# Patient Record
Sex: Female | Born: 1990 | Race: Black or African American | Hispanic: No | Marital: Single | Smoking: Current every day smoker
Health system: Southern US, Community
[De-identification: ages and names within clinical notes are randomized; demographics above are authoritative.]

## PROBLEM LIST (undated history)

## (undated) DIAGNOSIS — F419 Anxiety disorder, unspecified: Secondary | ICD-10-CM

## (undated) DIAGNOSIS — N83209 Unspecified ovarian cyst, unspecified side: Secondary | ICD-10-CM

## (undated) DIAGNOSIS — I10 Essential (primary) hypertension: Secondary | ICD-10-CM

## (undated) DIAGNOSIS — F32A Depression, unspecified: Secondary | ICD-10-CM

## (undated) DIAGNOSIS — J45909 Unspecified asthma, uncomplicated: Secondary | ICD-10-CM

## (undated) DIAGNOSIS — E669 Obesity, unspecified: Secondary | ICD-10-CM

## (undated) DIAGNOSIS — F329 Major depressive disorder, single episode, unspecified: Secondary | ICD-10-CM

## (undated) HISTORY — DX: Unspecified asthma, uncomplicated: J45.909

## (undated) HISTORY — DX: Major depressive disorder, single episode, unspecified: F32.9

## (undated) HISTORY — DX: Essential (primary) hypertension: I10

## (undated) HISTORY — DX: Depression, unspecified: F32.A

---
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
## 2009-02-09 ENCOUNTER — Encounter: Payer: Self-pay | Admitting: Family Medicine

## 2009-02-09 ENCOUNTER — Ambulatory Visit: Payer: Self-pay | Admitting: Family Medicine

## 2009-02-09 DIAGNOSIS — R809 Proteinuria, unspecified: Secondary | ICD-10-CM | POA: Insufficient documentation

## 2009-02-09 LAB — CONVERTED CEMR LAB
Bilirubin Urine: NEGATIVE
Nitrite: NEGATIVE
Urobilinogen, UA: 0.2
Whiff Test: NEGATIVE

## 2009-02-10 LAB — CONVERTED CEMR LAB
Chlamydia, DNA Probe: NEGATIVE
GC Probe Amp, Genital: NEGATIVE

## 2009-07-06 ENCOUNTER — Encounter: Payer: Self-pay | Admitting: Family Medicine

## 2009-07-25 ENCOUNTER — Ambulatory Visit: Payer: Self-pay | Admitting: Family Medicine

## 2009-07-25 ENCOUNTER — Encounter: Payer: Self-pay | Admitting: Family Medicine

## 2009-07-25 LAB — CONVERTED CEMR LAB

## 2009-07-26 ENCOUNTER — Encounter: Payer: Self-pay | Admitting: Family Medicine

## 2009-07-27 ENCOUNTER — Ambulatory Visit: Payer: Self-pay | Admitting: Family Medicine

## 2009-07-27 ENCOUNTER — Encounter: Payer: Self-pay | Admitting: Family Medicine

## 2009-07-27 ENCOUNTER — Encounter: Payer: Self-pay | Admitting: *Deleted

## 2009-07-27 ENCOUNTER — Telehealth (INDEPENDENT_AMBULATORY_CARE_PROVIDER_SITE_OTHER): Payer: Self-pay | Admitting: Family Medicine

## 2009-07-27 LAB — CONVERTED CEMR LAB
CO2: 25 meq/L (ref 19–32)
Cholesterol: 169 mg/dL (ref 0–200)
Creatinine, Ser: 0.79 mg/dL (ref 0.40–1.20)
Glucose, Bld: 104 mg/dL — ABNORMAL HIGH (ref 70–99)
Total Bilirubin: 0.5 mg/dL (ref 0.3–1.2)
Total CHOL/HDL Ratio: 4.2
Total Protein: 6.9 g/dL (ref 6.0–8.3)
Triglycerides: 158 mg/dL — ABNORMAL HIGH (ref <150)
VLDL: 32 mg/dL (ref 0–40)

## 2009-07-28 ENCOUNTER — Ambulatory Visit: Payer: Self-pay | Admitting: Family Medicine

## 2009-07-31 ENCOUNTER — Ambulatory Visit: Payer: Self-pay | Admitting: Family Medicine

## 2009-07-31 LAB — CONVERTED CEMR LAB: Beta hcg, urine, semiquantitative: NEGATIVE

## 2009-10-27 ENCOUNTER — Encounter: Payer: Self-pay | Admitting: Family Medicine

## 2010-06-12 NOTE — Miscellaneous (Signed)
Summary: STD tx order  Please give patient Azithromycin 1 gm slurry and Ceftriaxone 125 mg IM.  Lequita Asal  MD  July 26, 2009 9:56 AM

## 2010-06-12 NOTE — Miscellaneous (Signed)
Summary: cpe/pap,tcb   Allergies: No Known Drug Allergies   Complete Medication List: 1)  Flonase 50 Mcg/act Susp (Fluticasone propionate) .... 2 sprays in each nostril daily 2)  Proventil Hfa 108 (90 Base) Mcg/act Aers (Albuterol sulfate) .... Two puffs q4-q6 as needed shortness of breath/wheezing 3)  Hydrochlorothiazide 12.5 Mg Caps (Hydrochlorothiazide) .... One tab by mouth daily 4)  Sprintec 28 0.25-35 Mg-mcg Tabs (Norgestimate-eth estradiol) .Marland Kitchen.. 1 tab by mouth daily for birth control  Other Orders: No Charge Patient Arrived (NCPA0) (NCPA0)

## 2010-06-12 NOTE — Assessment & Plan Note (Signed)
Summary: STD TX/[facility]     Nurse Visit  patient left after having blood drawn before receiving STD treatment. she will return tomorrow. Theresia Lo RN  July 27, 2009 4:15 PM   Allergies: No Known Drug Allergies  Orders Added: 1)  No Charge Patient Arrived (NCPA0) [NCPA0]

## 2010-06-12 NOTE — Miscellaneous (Signed)
   Clinical Lists Changes  Problems: Removed problem of GONORRHEA (ICD-098.0) Removed problem of CHLAMYDIAL INFECTION (ICD-099.41) Removed problem of CONTRACEPTIVE MANAGEMENT (ICD-V25.09) Removed problem of SEXUALLY TRANSMITTED DISEASE, EXPOSURE TO (ICD-V01.6) Removed problem of ROUTINE GYNECOLOGICAL EXAMINATION (ICD-V72.31) Medications: Removed medication of SPRINTEC 28 0.25-35 MG-MCG TABS (NORGESTIMATE-ETH ESTRADIOL) 1 tab by mouth daily for birth control Removed medication of HYDROCHLOROTHIAZIDE 12.5 MG CAPS (HYDROCHLOROTHIAZIDE) one tab by mouth daily

## 2010-06-12 NOTE — Progress Notes (Signed)
Summary: phn msg   Phone Note Call from Patient Call back at [facility] Phone [phone]   Caller: Patient Summary of Call: Pt returning call not sure who called her. Initial call taken by: Clydell Hakim,  July 27, 2009 9:50 AM  Follow-up for Phone Call        patient had appointment this AM for labs and STD treatment . she left before she received STD treatment. spoke with patient and she will come back this afternoon. Follow-up by: Theresia Lo RN,  July 27, 2009 11:13 AM

## 2010-06-12 NOTE — Assessment & Plan Note (Signed)
Summary: std treatment/ls   Nurse Visit   Allergies: No Known Drug Allergies  Medication Administration  Injection # 1:    Medication: Rocephin  250mg     Diagnosis: GONORRHEA (ICD-098.0)    Route: IM    Site: RUOQ gluteus    Exp Date: 11/2011    Lot #: [id number]    Mfr: Sandoz    Comments:    Rocephin 125 mg given IM. patient waited in office 20 minutes without any complications.    Patient tolerated injection without complications    Given by: Theresia Lo RN (July 28, 2009 2:12 PM)  Medication # 1:    Medication: Azithromycin oral    Diagnosis: GONORRHEA (ICD-098.0)    Dose: 1 gram    Route: po    Exp Date: 04/12/2010    Lot #: [id number]    Mfr: pfizer    Patient tolerated medication without complications    Given by: Theresia Lo RN (July 28, 2009 2:22 PM)  Orders Added: 1)  Azithromycin oral [Q0144] 2)  Rocephin  250mg  [J0696] 3)  Admin of Injection (IM/SQ) [69629]   Medication Administration  Injection # 1:    Medication: Rocephin  250mg     Diagnosis: GONORRHEA (ICD-098.0)    Route: IM    Site: RUOQ gluteus    Exp Date: 11/2011    Lot #: [id number]    Mfr: Sandoz    Comments:    Rocephin 125 mg given IM. patient waited in office 20 minutes without any complications.    Patient tolerated injection without complications    Given by: Theresia Lo RN (July 28, 2009 2:12 PM)  Medication # 1:    Medication: Azithromycin oral    Diagnosis: GONORRHEA (ICD-098.0)    Dose: 1 gram    Route: po    Exp Date: 04/12/2010    Lot #: [id number]    Mfr: pfizer    Patient tolerated medication without complications    Given by: Theresia Lo RN (July 28, 2009 2:22 PM)  Orders Added: 1)  Azithromycin oral [Q0144] 2)  Rocephin  250mg  [J0696] 3)  Admin of Injection (IM/SQ) [72536]    patient advised to tell partner to be treated. , abstain from sex for 7 days and always use condoms to prevent STD. states she has been off birth control pills since september 2010.  she wanted  Rx sent in . she has a current Rx at pharmacy. advised patient after she had left office to come back in for Upreg before starting pills. she will come back in Monday. Theresia Lo RN  July 28, 2009 2:32 PM  Appended Document: std treatment/ls Communicable Disease report faxed to [facility]

## 2010-06-12 NOTE — Letter (Signed)
Summary: Generic Letter  [facility]  [address]   [place], [place] [address]   Phone: [phone]  Fax: [phone]    07/27/2009  Sara Taylor [address] [place], [place]  [address]  Dear [name],    Your recent labs were all normal. Please call if you have questions.       Sincerely,   Lequita Asal  MD  Appended Document: Generic Letter mailed.

## 2010-06-12 NOTE — Assessment & Plan Note (Signed)
Summary: CPP/EO   Vital Signs:  Patient profile:   20 year old female Height:      65.0 inches Weight:      312.1 pounds BMI:     52.12 Temp:     98.4 degrees F Pulse rate:   83 / minute BP sitting:   147 / 89  (left arm)  Vitals Entered By: Starleen Blue RN (July 25, 2009 10:54 AM)  Nutrition Counseling: Patient's BMI is greater than 25 and therefore counseled on weight management options. CC: cpe Is Patient Diabetic? No Pain Assessment Patient in pain? no        Primary Care Provider:  Lequita Asal  MD  CC:  cpe.  History of Present Illness: 20 y/o female with h/o HTN here for CPE.  high school graduate. currently unemployed. trying to get into school at [employer].  Not currently dating anyone exclusively, but is sexually active. Endorses consistent condom use.   HTN- not on meds. denies CP, SOB, peripheral edema, palpitations, blurred vision.   Habits & Providers  Alcohol-Tobacco-Diet     Tobacco Status: current     Tobacco Counseling: to quit use of tobacco products     Cigarette Packs/Day: <0.25  Current Medications (verified): 1)  Sprintec 28 0.25-35 Mg-Mcg Tabs (Norgestimate-Eth Estradiol) .Marland Kitchen.. 1 Tab By Mouth Daily For Birth Control  Allergies (verified): No Known Drug Allergies  Past History:  Past medical, surgical, family and social histories (including risk factors) reviewed, and no changes noted (except as noted below).  Past Medical History: Reviewed history from 06/09/2008 and no changes required. ? Reactive airway DZ, + acanthosis nigricans, Regular periods, menarche 20 y/o  Past Surgical History: Reviewed history from 07/10/2006 and no changes required. nonfasting CBG 105 - 01/24/2005  Family History: Reviewed history from 07/10/2006 and no changes required. grandfather- DM, mult family members obese, mult family members w/asthma mother- hyperthyroidism, HTN  Social History: Reviewed history from 06/09/2008 and no changes  required. lives with mother Karin [place]-[facility] patient) and sister.  currently trying to get into school at [employer]. No drugs, EtoH. previously smoked 1/2 PPD, down to 2-3 cigs/day.Packs/Day:  <0.25  Physical Exam  General:  obese female, NAD, vitals reviewed. hirsute Eyes:  vision grossly normal. EOMI, PERRLA, no scleral icterus.  Ears:  hearing grossly normal Nose:  External nasal examination shows no deformity or inflammation. Nasal mucosa are pink and moist without lesions or exudates. Mouth:  Oral mucosa and oropharynx without lesions or exudates.  Teeth in good repair. Neck:  No deformities, masses, or tenderness noted. acanthosis nigricans Breasts:  No mass, nodules, thickening, tenderness, bulging, retraction, inflamation, nipple discharge or skin changes noted.   Lungs:  Normal respiratory effort, chest expands symmetrically. Lungs are clear to auscultation, no crackles or wheezes. Heart:  Normal rate and regular rhythm. S1 and S2 normal without gallop, murmur, click, rub or other extra sounds. Abdomen:  obese, NT, ND, +BS.  Genitalia:  Normal introitus for age, no external lesions, no vaginal discharge, mucosa pink and moist, no vaginal or cervical lesions, no vaginal atrophy, no friaility or hemorrhage, normal uterus size and position, no adnexal masses or tenderness Extremities:  no c/c/e of BLE.  Neurologic:  alert & oriented X3 and cranial nerves II-XII intact.     Impression & Recommendations:  Problem # 1:  ROUTINE GYNECOLOGICAL EXAMINATION (ICD-V72.31) Assessment Unchanged  no need for pap. remainder of exam normal. f/u in 1 year.   Orders: [facility] - Est  18-39 yrs ([id number])  Problem # 2:  SEXUALLY TRANSMITTED DISEASE, EXPOSURE TO (ICD-V01.6) Assessment: Unchanged wet prep negative. gc/chl and other labs pending. encouraged condom use every time.   Orders: Wet Prep- [facility] [phone]) GC/Chlamydia-[facility] ([id number])  Future Orders: RPR-[facility] ([id number]) ... 07/26/2010 Hep  C Ab-[facility] ([id number]) ... 07/26/2010 Hep Bs Ag-[facility] ([id number]) ... 07/26/2010 HIV-[facility] ([id number]) ... 07/26/2010  Problem # 3:  HYPERTENSION, BENIGN SYSTEMIC (ICD-401.1) Assessment: Deteriorated patient encouraged to take medications as prescribed. check labs. resume HCTZ  The following medications were removed from the medication list:    Hydrochlorothiazide 12.5 Mg Caps (Hydrochlorothiazide) ..... One tab by mouth daily Her updated medication list for this problem includes:    Hydrochlorothiazide 12.5 Mg Caps (Hydrochlorothiazide) ..... One tab by mouth daily  Future Orders: Lipid-[facility] ([id number]) ... 07/26/2010 Comp Met-[facility] ([id number]) ... 07/26/2010  Problem # 4:  TOBACCO USE (ICD-305.1) Assessment: Unchanged patient encourage to quit. given information about quit line.  Prescriptions: HYDROCHLOROTHIAZIDE 12.5 MG CAPS (HYDROCHLOROTHIAZIDE) one tab by mouth daily  #30 x 3   Entered and Authorized by:   Lequita Asal  MD   Signed by:   Lequita Asal  MD on 07/25/2009   Method used:   Electronically to        [facility] [phone]* (retail)       [address]       [place], [place]  [address]       Ph: [phone] or [phone]       Fax: [phone]   RxID:   [id number]   Laboratory Results  Date/Time Received: July 25, 2009 11:21 AM  Date/Time Reported: July 25, 2009 11:35 AM   Wet [place] Source: vag WBC/hpf: 10-20 Bacteria/hpf: 3+  Rods Clue cells/hpf: none  Negative whiff Yeast/hpf: none Trichomonas/hpf: none Comments: ...............test performed by......Marland KitchenBonnie A. [place], MLS (ASCP)cm    Appended Document: CPP/EO     Clinical Lists Changes  Observations: Added new observation of HTNSMSUPP: Written self-care plan, Education handout (07/25/2009 12:17) Added new observation of PTPLANACTIV: take a 30 minute walk every day (07/25/2009 12:17) Added new observation of PTPLANEATING: eat foods that are  low in salt (07/25/2009 12:17) Added new observation of PTPLANMEDMON: take my medicines every day (07/25/2009 12:17) Added new observation of PERSBPGOAL: 140/90 (07/25/2009 12:17) Added new observation of HTN PROGRESS: Deteriorated (07/25/2009 12:17) Added new observation of HTN FSREVIEW: Yes (07/25/2009 12:17) Added new observation of DM PROGRESS: N/A (07/25/2009 12:17) Added new observation of DM FSREVIEW: N/A (07/25/2009 12:17) Added new observation of LIPID PROGRS: N/A (07/25/2009 12:17) Added new observation of LIPID FSREVW: N/A (07/25/2009 12:17)       Prevention & Chronic Care Immunizations   Influenza vaccine: Not documented    Tetanus booster: Not documented    Pneumococcal vaccine: Not documented  Other Screening   Pap smear: NEGATIVE FOR INTRAEPITHELIAL LESIONS OR MALIGNANCY.  (06/09/2008)   Smoking status: current  (07/25/2009)   Smoking cessation counseling: yes  (06/09/2008)  Hypertension   Last Blood Pressure: 147 / 89  (07/25/2009)   Serum creatinine: 0.79  (06/09/2008)   Serum potassium 3.7  (06/09/2008)    Hypertension flowsheet reviewed?: Yes   Progress toward BP goal: Deteriorated  Self-Management Support :   Personal Goals (by the next clinic visit) :      Personal blood pressure goal: 140/90  (07/25/2009)   Patient will work on the following items until the next clinic visit to reach self-care goals:     Medications  and monitoring: take my medicines every day  (07/25/2009)     Eating: eat foods that are low in salt  (07/25/2009)     Activity: take a 30 minute walk every day  (07/25/2009)    Hypertension self-management support: Written self-care plan, Education handout  (07/25/2009)   Hypertension self-care plan printed.   Hypertension education handout printed

## 2010-10-26 ENCOUNTER — Emergency Department (HOSPITAL_COMMUNITY)
Admission: EM | Admit: 2010-10-26 | Discharge: 2010-10-26 | Disposition: A | Discharge status: Home / Self Care | Payer: Self-pay | Attending: Emergency Medicine | Admitting: Emergency Medicine

## 2010-10-26 DIAGNOSIS — L0231 Cutaneous abscess of buttock: Secondary | ICD-10-CM | POA: Insufficient documentation

## 2010-10-26 DIAGNOSIS — L02419 Cutaneous abscess of limb, unspecified: Secondary | ICD-10-CM | POA: Insufficient documentation

## 2010-10-26 DIAGNOSIS — L03317 Cellulitis of buttock: Secondary | ICD-10-CM | POA: Insufficient documentation

## 2010-10-26 DIAGNOSIS — E669 Obesity, unspecified: Secondary | ICD-10-CM | POA: Insufficient documentation

## 2010-10-26 LAB — DIFFERENTIAL
Basophils Relative: 0 % (ref 0–1)
Monocytes Relative: 6 % (ref 3–12)
Neutro Abs: 9.3 10*3/uL — ABNORMAL HIGH (ref 1.7–7.7)
Neutrophils Relative %: 77 % (ref 43–77)

## 2010-10-26 LAB — COMPREHENSIVE METABOLIC PANEL
ALT: 30 U/L (ref 0–35)
AST: 29 U/L (ref 0–37)
Alkaline Phosphatase: 78 U/L (ref 39–117)
CO2: 26 mEq/L (ref 19–32)
Calcium: 9.2 mg/dL (ref 8.4–10.5)
Chloride: 100 mEq/L (ref 96–112)
GFR calc Af Amer: >60 mL/min (ref >60)
GFR calc non Af Amer: >60 mL/min (ref >60)
Glucose, Bld: 113 mg/dL — ABNORMAL HIGH (ref 70–99)
Potassium: 3.1 mEq/L — ABNORMAL LOW (ref 3.5–5.1)
Sodium: 137 mEq/L (ref 135–145)
Total Bilirubin: 0.6 mg/dL (ref 0.3–1.2)

## 2010-10-26 LAB — CBC
Hemoglobin: 12.5 g/dL (ref 12.0–15.0)
MCH: 30.7 pg (ref 26.0–34.0)
RBC: 4.07 MIL/uL (ref 3.87–5.11)
WBC: 12.1 10*3/uL — ABNORMAL HIGH (ref 4.0–10.5)

## 2010-10-26 LAB — RAPID URINE DRUG SCREEN, HOSP PERFORMED
Amphetamines: NOT DETECTED
Barbiturates: NOT DETECTED
Tetrahydrocannabinol: POSITIVE — AB

## 2010-10-27 ENCOUNTER — Emergency Department (HOSPITAL_COMMUNITY)
Admission: EM | Admit: 2010-10-27 | Discharge: 2010-10-27 | Discharge status: Against Medical Advice | Payer: Self-pay | Attending: Emergency Medicine | Admitting: Emergency Medicine

## 2010-10-27 DIAGNOSIS — L02419 Cutaneous abscess of limb, unspecified: Secondary | ICD-10-CM | POA: Insufficient documentation

## 2010-10-27 DIAGNOSIS — Z0389 Encounter for observation for other suspected diseases and conditions ruled out: Secondary | ICD-10-CM | POA: Insufficient documentation

## 2010-10-27 DIAGNOSIS — E669 Obesity, unspecified: Secondary | ICD-10-CM | POA: Insufficient documentation

## 2010-10-27 DIAGNOSIS — L03317 Cellulitis of buttock: Secondary | ICD-10-CM | POA: Insufficient documentation

## 2010-10-27 DIAGNOSIS — L0231 Cutaneous abscess of buttock: Secondary | ICD-10-CM | POA: Insufficient documentation

## 2010-10-27 DIAGNOSIS — L03119 Cellulitis of unspecified part of limb: Secondary | ICD-10-CM | POA: Insufficient documentation

## 2010-10-27 DIAGNOSIS — IMO0001 Reserved for inherently not codable concepts without codable children: Secondary | ICD-10-CM | POA: Insufficient documentation

## 2010-10-30 ENCOUNTER — Emergency Department (HOSPITAL_COMMUNITY)
Admission: EM | Admit: 2010-10-30 | Discharge: 2010-10-30 | Disposition: A | Discharge status: Home / Self Care | Payer: Self-pay | Attending: Emergency Medicine | Admitting: Emergency Medicine

## 2010-10-30 DIAGNOSIS — L02419 Cutaneous abscess of limb, unspecified: Secondary | ICD-10-CM | POA: Insufficient documentation

## 2010-10-30 DIAGNOSIS — L03119 Cellulitis of unspecified part of limb: Secondary | ICD-10-CM | POA: Insufficient documentation

## 2010-10-30 DIAGNOSIS — E669 Obesity, unspecified: Secondary | ICD-10-CM | POA: Insufficient documentation

## 2011-10-28 ENCOUNTER — Ambulatory Visit: Payer: Self-pay | Admitting: Family Medicine

## 2011-11-07 ENCOUNTER — Encounter: Payer: Self-pay | Admitting: Family Medicine

## 2012-03-09 ENCOUNTER — Encounter: Payer: Self-pay | Admitting: Family Medicine

## 2012-03-09 ENCOUNTER — Other Ambulatory Visit (HOSPITAL_COMMUNITY)
Admission: RE | Admit: 2012-03-09 | Discharge: 2012-03-09 | Disposition: A | Discharge status: Home / Self Care | Payer: Medicaid Other | Source: Ambulatory Visit | Attending: Family Medicine | Admitting: Family Medicine

## 2012-03-09 ENCOUNTER — Ambulatory Visit (INDEPENDENT_AMBULATORY_CARE_PROVIDER_SITE_OTHER): Payer: Medicaid Other | Admitting: Family Medicine

## 2012-03-09 VITALS — BP 148/77 | HR 98 | Temp 99.1°F | Ht 65.0 in | Wt 309.2 lb

## 2012-03-09 DIAGNOSIS — N912 Amenorrhea, unspecified: Secondary | ICD-10-CM

## 2012-03-09 DIAGNOSIS — L709 Acne, unspecified: Secondary | ICD-10-CM

## 2012-03-09 DIAGNOSIS — E669 Obesity, unspecified: Secondary | ICD-10-CM

## 2012-03-09 DIAGNOSIS — Z0000 Encounter for general adult medical examination without abnormal findings: Secondary | ICD-10-CM

## 2012-03-09 DIAGNOSIS — Z01419 Encounter for gynecological examination (general) (routine) without abnormal findings: Secondary | ICD-10-CM | POA: Insufficient documentation

## 2012-03-09 DIAGNOSIS — L708 Other acne: Secondary | ICD-10-CM

## 2012-03-09 DIAGNOSIS — I10 Essential (primary) hypertension: Secondary | ICD-10-CM

## 2012-03-09 DIAGNOSIS — F17200 Nicotine dependence, unspecified, uncomplicated: Secondary | ICD-10-CM

## 2012-03-09 DIAGNOSIS — Z113 Encounter for screening for infections with a predominantly sexual mode of transmission: Secondary | ICD-10-CM | POA: Insufficient documentation

## 2012-03-09 DIAGNOSIS — Z124 Encounter for screening for malignant neoplasm of cervix: Secondary | ICD-10-CM

## 2012-03-09 DIAGNOSIS — N760 Acute vaginitis: Secondary | ICD-10-CM

## 2012-03-09 LAB — POCT GLYCOSYLATED HEMOGLOBIN (HGB A1C): Hemoglobin A1C: 5

## 2012-03-09 MED ORDER — HYDROCHLOROTHIAZIDE 25 MG PO TABS: 12.5000 mg | ORAL_TABLET | Freq: Every day | ORAL | Status: DC

## 2012-03-09 MED ORDER — TRETINOIN 0.025 % EX CREA: TOPICAL_CREAM | Freq: Every day | CUTANEOUS | Status: DC

## 2012-03-09 NOTE — Patient Instructions (Addendum)
Your blood pressure is over goal 140/90. Start HCTZ again. Make appointment in 2 weeks for check up. If you stop smoking and your BP is better, then birth control pills might be safer. Think about other options such as implanon or IUD for now. You can use tretinoin cream for acne.  Smoking Cessation, Tips for Success YOU CAN QUIT SMOKING If you are ready to quit smoking, congratulations! You have chosen to help yourself be healthier. Cigarettes bring nicotine, tar, carbon monoxide, and other irritants into your body. Your lungs, heart, and blood vessels will be able to work better without these poisons. There are many different ways to quit smoking. Nicotine gum, nicotine patches, a nicotine inhaler, or nicotine nasal spray can help with physical craving. Hypnosis, support groups, and medicines help break the habit of smoking. Here are some tips to help you quit for good.  Throw away all cigarettes.  Clean and remove all ashtrays from your home, work, and car.  On a card, write down your reasons for quitting. Carry the card with you and read it when you get the urge to smoke.  Cleanse your body of nicotine. Drink enough water and fluids to keep your urine clear or pale yellow. Do this after quitting to flush the nicotine from your body.  Learn to predict your moods. Do not let a bad situation be your excuse to have a cigarette. Some situations in your life might tempt you into wanting a cigarette.  Never have "just one" cigarette. It leads to wanting another and another. Remind yourself of your decision to quit.  Change habits associated with smoking. If you smoked while driving or when feeling stressed, try other activities to replace smoking. Stand up when drinking your coffee. Brush your teeth after eating. Sit in a different chair when you read the paper. Avoid alcohol while trying to quit, and try to drink fewer caffeinated beverages. Alcohol and caffeine may urge you to smoke.  Avoid  foods and drinks that can trigger a desire to smoke, such as sugary or spicy foods and alcohol.  Ask people who smoke not to smoke around you.  Have something planned to do right after eating or having a cup of coffee. Take a walk or exercise to perk you up. This will help to keep you from overeating.  Try a relaxation exercise to calm you down and decrease your stress. Remember, you may be tense and nervous for the first 2 weeks after you quit, but this will pass.  Find new activities to keep your hands busy. Play with a pen, coin, or rubber band. Doodle or draw things on paper.  Brush your teeth right after eating. This will help cut down on the craving for the taste of tobacco after meals. You can try mouthwash, too.  Use oral substitutes, such as lemon drops, carrots, a cinnamon stick, or chewing gum, in place of cigarettes. Keep them handy so they are available when you have the urge to smoke.  When you have the urge to smoke, try deep breathing.  Designate your home as a nonsmoking area.  If you are a heavy smoker, ask your caregiver about a prescription for nicotine chewing gum. It can ease your withdrawal from nicotine.  Reward yourself. Set aside the cigarette money you save and buy yourself something nice.  Look for support from others. Join a support group or smoking cessation program. Ask someone at home or at work to help you with your plan to  quit smoking.  Always ask yourself, "Do I need this cigarette or is this just a reflex?" Tell yourself, "Today, I choose not to smoke," or "I do not want to smoke." You are reminding yourself of your decision to quit, even if you do smoke a cigarette. HOW WILL I FEEL WHEN I QUIT SMOKING?  The benefits of not smoking start within days of quitting.  You may have symptoms of withdrawal because your body is used to nicotine (the addictive substance in cigarettes). You may crave cigarettes, be irritable, feel very hungry, cough often, get  headaches, or have difficulty concentrating.  The withdrawal symptoms are only temporary. They are strongest when you first quit but will go away within 10 to 14 days.  When withdrawal symptoms occur, stay in control. Think about your reasons for quitting. Remind yourself that these are signs that your body is healing and getting used to being without cigarettes.  Remember that withdrawal symptoms are easier to treat than the major diseases that smoking can cause.  Even after the withdrawal is over, expect periodic urges to smoke. However, these cravings are generally short-lived and will go away whether you smoke or not. Do not smoke!  If you relapse and smoke again, do not lose hope. Most smokers quit 3 times before they are successful.  If you relapse, do not give up! Plan ahead and think about what you will do the next time you get the urge to smoke. LIFE AS A NONSMOKER: MAKE IT FOR A MONTH, MAKE IT FOR LIFE Day 1: Hang this page where you will see it every day. Day 2: Get rid of all ashtrays, matches, and lighters. Day 3: Drink water. Breathe deeply between sips. Day 4: Avoid places with smoke-filled air, such as bars, clubs, or the smoking section of restaurants. Day 5: Keep track of how much money you save by not smoking. Day 6: Avoid boredom. Keep a good book with you or go to the movies. Day 7: Reward yourself! One week without smoking! Day 8: Make a dental appointment to get your teeth cleaned. Day 9: Decide how you will turn down a cigarette before it is offered to you. Day 10: Review your reasons for quitting. Day 11: Distract yourself. Stay active to keep your mind off smoking and to relieve tension. Take a walk, exercise, read a book, do a crossword puzzle, or try a new hobby. Day 12: Exercise. Get off the bus before your stop or use stairs instead of escalators. Day 13: Call on friends for support and encouragement. Day 14: Reward yourself! Two weeks without smoking! Day  15: Practice deep breathing exercises. Day 16: Bet a friend that you can stay a nonsmoker. Day 17: Ask to sit in nonsmoking sections of restaurants. Day 18: Hang up "No Smoking" signs. Day 19: Think of yourself as a nonsmoker. Day 20: Each morning, tell yourself you will not smoke. Day 21: Reward yourself! Three weeks without smoking! Day 22: Think of smoking in negative ways. Remember how it stains your teeth, gives you bad breath, and leaves you short of breath. Day 23: Eat a nutritious breakfast. Day 24:Do not relive your days as a smoker. Day 25: Hold a pencil in your hand when talking on the telephone. Day 26: Tell all your friends you do not smoke. Day 27: Think about how much better food tastes. Day 28: Remember, one cigarette is one too many. Day 29: Take up a hobby that will keep your hands busy.  Day 30: Congratulations! One month without smoking! Give yourself a big reward. Your caregiver can direct you to community resources or hospitals for support, which may include:  Group support.  Education.  Hypnosis.  Subliminal therapy. Document Released: 01/26/2004 Document Revised: 07/22/2011 Document Reviewed: 02/13/2009 [facility] Patient Information 2013 [place], [place].

## 2012-03-10 ENCOUNTER — Telehealth: Payer: Self-pay | Admitting: *Deleted

## 2012-03-10 ENCOUNTER — Encounter: Payer: Self-pay | Admitting: Family Medicine

## 2012-03-10 LAB — COMPREHENSIVE METABOLIC PANEL
ALT: 23 U/L (ref 0–35)
AST: 17 U/L (ref 0–37)
Albumin: 3.8 g/dL (ref 3.5–5.2)
Alkaline Phosphatase: 50 U/L (ref 39–117)
Calcium: 9 mg/dL (ref 8.4–10.5)
Chloride: 104 mEq/L (ref 96–112)
Potassium: 4 mEq/L (ref 3.5–5.3)
Sodium: 137 mEq/L (ref 135–145)

## 2012-03-10 LAB — TSH: TSH: 0.772 u[IU]/mL (ref 0.350–4.500)

## 2012-03-10 LAB — CBC
Hemoglobin: 12.2 g/dL (ref 12.0–15.0)
MCH: 30.6 pg (ref 26.0–34.0)
MCHC: 34.5 g/dL (ref 30.0–36.0)
RDW: 14.4 % (ref 11.5–15.5)

## 2012-03-10 NOTE — Assessment & Plan Note (Addendum)
Above goal. Will restart HCTZ at 12.5 mg daily. Recheck in 2 weeks. Counseled smoking cessation. Will not restart OCPs if she is still smoking and her BP is uncontrolled. Advised pt to consider implanon or mirena.

## 2012-03-10 NOTE — Assessment & Plan Note (Signed)
Counseled for cessation. She refuses assistive medications.

## 2012-03-10 NOTE — Assessment & Plan Note (Signed)
Worsened. Will check lipids, LFTs, TSH. Discussed weight loss principles with patient including increased activity and decreased calorie intake (soda and juice). F/u in 2 weeks.  Body mass index is 51.45 kg/(m^2).

## 2012-03-10 NOTE — Telephone Encounter (Signed)
Message copied by Farrell Ours on Tue Mar 10, 2012  5:27 PM ------      Message from: Durwin Reges      Created: Tue Mar 10, 2012 12:56 PM      Regarding: positive testing        Please inform pt she needs to come in for treatment of chlamydia. Azithromycin slurry x 1 PO.

## 2012-03-10 NOTE — Telephone Encounter (Signed)
LVM for patient to call back to inform of below and schedule an appointment for her to get treatment

## 2012-03-10 NOTE — Assessment & Plan Note (Signed)
Has manifestations of PCOS with irregular menses. Check A1c. Will start topical low dose retinoid for now. OCPs might help, but she is not a candidate while smoking with HTN.

## 2012-03-10 NOTE — Progress Notes (Signed)
  Subjective:    Patient ID: Sara Taylor, female    DOB: Feb 24, 1991, 21 y.o.   MRN: [id number]  HPI CPE.   1. HTN. Has not been taking any medications, was previously on HCTZ. She wishes to re-establish care for this problem.   2. Obesity. Has been gaining weight over past few years, consistently above 95% on weight curve during childhood. 282 lbs in 2009, now 309 lbs.  Has family history of HTN and diabetes.  3. Irregular menstrual cycle. Irregular periods occurring past 2 years. LMP was 01/02/12. She states her periods were regulated when she took OCPs previously. Is interested in restarting these. She does not wish to become pregnant. She also has acne, weight gain. She smokes cigarettes daily.  Past Medical History  Diagnosis Date  . Hypertension    Review of Systems Denies abdominal pain, dysuria, polyuria, fatigue, abdominal pain, chest pains, dyspnea, stress, depression, breast pain, hematuria, swelling.    Objective:   Physical Exam  Vitals reviewed. Constitutional: She is oriented to person, place, and time. She appears well-developed and well-nourished. No distress.       Morbidly obese  HENT:  Head: Normocephalic and atraumatic.  Mouth/Throat: Oropharynx is clear and moist. No oropharyngeal exudate.  Eyes: EOM are normal. Pupils are equal, round, and reactive to light.  Neck: Neck supple. No JVD present. No thyromegaly present.  Cardiovascular: Normal rate, regular rhythm, normal heart sounds and intact distal pulses.   No murmur heard. Pulmonary/Chest: Effort normal and breath sounds normal. No respiratory distress. She has no wheezes. She has no rales.  Abdominal: Soft. Bowel sounds are normal. She exhibits no distension. There is no tenderness. There is no rebound and no guarding.  Genitourinary: Vagina normal.       White discharge. Mucosa normal. No cervical motion tenderness.  Obesity limits fundal exam.  Musculoskeletal: She exhibits no edema and no  tenderness.  Lymphadenopathy:    She has no cervical adenopathy.  Neurological: She is alert and oriented to person, place, and time. No cranial nerve deficit. Coordination normal.  Skin: No rash noted. She is not diaphoretic.  Psychiatric: She has a normal mood and affect.       Assessment & Plan:

## 2012-03-11 ENCOUNTER — Encounter: Payer: Self-pay | Admitting: Family Medicine

## 2012-03-11 NOTE — Telephone Encounter (Signed)
LVm on [phone] for patient to call back. The 2 numbers on file are out of service

## 2012-03-11 NOTE — Telephone Encounter (Signed)
Will send letter to patient due to no answer numerous times

## 2012-03-23 ENCOUNTER — Ambulatory Visit: Payer: Medicaid Other | Admitting: Family Medicine

## 2012-04-15 ENCOUNTER — Telehealth: Payer: Self-pay | Admitting: *Deleted

## 2012-04-15 NOTE — Telephone Encounter (Signed)
Patient has finally called back and she is coming in at 9:45am 12/5 to get treatment for chlamydia

## 2012-04-16 ENCOUNTER — Ambulatory Visit (INDEPENDENT_AMBULATORY_CARE_PROVIDER_SITE_OTHER): Payer: Medicaid Other | Admitting: *Deleted

## 2012-04-16 DIAGNOSIS — A749 Chlamydial infection, unspecified: Secondary | ICD-10-CM

## 2012-04-16 MED ORDER — AZITHROMYCIN 1 G PO PACK
1.0000 g | PACK | Freq: Once | ORAL | Status: AC
  Administered 2012-04-16: 1 g via ORAL

## 2012-04-16 NOTE — Progress Notes (Signed)
Patient in for STD treatment.  Consulted with Dr. Gwendolyn Grant . Advised to return in one month for culture for cure. Advised to tell partners to be treated and abstain from sex for 7 days .    Communicable Disease report faxed to [facility].

## 2012-05-26 ENCOUNTER — Ambulatory Visit (INDEPENDENT_AMBULATORY_CARE_PROVIDER_SITE_OTHER): Payer: Medicaid Other | Admitting: Family Medicine

## 2012-05-26 ENCOUNTER — Other Ambulatory Visit (HOSPITAL_COMMUNITY)
Admission: RE | Admit: 2012-05-26 | Discharge: 2012-05-26 | Disposition: A | Discharge status: Home / Self Care | Payer: Medicaid Other | Source: Ambulatory Visit | Attending: Family Medicine | Admitting: Family Medicine

## 2012-05-26 VITALS — BP 98/67 | HR 92 | Temp 98.2°F | Ht 66.5 in | Wt 309.0 lb

## 2012-05-26 DIAGNOSIS — Z8619 Personal history of other infectious and parasitic diseases: Secondary | ICD-10-CM | POA: Insufficient documentation

## 2012-05-26 DIAGNOSIS — N898 Other specified noninflammatory disorders of vagina: Secondary | ICD-10-CM

## 2012-05-26 DIAGNOSIS — Z79899 Other long term (current) drug therapy: Secondary | ICD-10-CM | POA: Insufficient documentation

## 2012-05-26 DIAGNOSIS — Z113 Encounter for screening for infections with a predominantly sexual mode of transmission: Secondary | ICD-10-CM | POA: Insufficient documentation

## 2012-05-26 LAB — POCT URINE PREGNANCY: Preg Test, Ur: NEGATIVE

## 2012-05-26 LAB — POCT WET PREP (WET MOUNT): Clue Cells Wet Prep Whiff POC: POSITIVE

## 2012-05-26 NOTE — Assessment & Plan Note (Signed)
U preg negative, we will fax to [facility] for her.

## 2012-05-26 NOTE — Progress Notes (Signed)
  Subjective:    Patient ID: Sara Taylor, female    DOB: 1991-01-19, 22 y.o.   MRN: [id number]  HPI  Sara Taylor comes in for STD testing for proof of cure from Chlamydia she had in October.  She denies any vaginal itching, discharge or abdominal pain. She is sexually active with one parter (who was also treated), and they are using condoms.   She needs a pregnancy test to be done to send to [facility] where she is getting her mental health care. Again, she is using condoms, and has not missed a period.    Review of Systems See HPI    Objective:   Physical Exam BP 98/67  Pulse 92  Temp 98.2 F (36.8 C) (Oral)  Ht 5' 6.5" (1.689 m)  Wt 309 lb (140.161 kg)  BMI 49.13 kg/m2 General appearance: alert, cooperative and no distress Pelvic: cervix normal in appearance, exam obscured by obesity, external genitalia normal, no adnexal masses or tenderness, no cervical motion tenderness, rectovaginal septum normal, uterus normal size, shape, and consistency and vagina normal without discharge       Assessment & Plan:

## 2012-05-26 NOTE — Assessment & Plan Note (Signed)
GC/Chlamydia and wet prep done today for proof of cure, reviewed safe sex practices.

## 2012-05-26 NOTE — Patient Instructions (Addendum)
It was nice to meet you.  Your pregnancy test was negative.  I will send you a letter with your lab results, or call you if anything is abnormal.   Please think about what kind of birth control would work best for you and make an appointment to see Dr. Cristal Ford.

## 2012-05-29 ENCOUNTER — Encounter: Payer: Self-pay | Admitting: Family Medicine

## 2012-06-27 ENCOUNTER — Other Ambulatory Visit: Payer: Self-pay

## 2013-03-18 ENCOUNTER — Other Ambulatory Visit: Payer: Self-pay

## 2013-05-31 ENCOUNTER — Ambulatory Visit: Payer: Medicaid Other

## 2013-06-02 ENCOUNTER — Encounter: Payer: Self-pay | Admitting: Family Medicine

## 2013-06-02 ENCOUNTER — Ambulatory Visit (INDEPENDENT_AMBULATORY_CARE_PROVIDER_SITE_OTHER): Payer: No Typology Code available for payment source | Admitting: Family Medicine

## 2013-06-02 ENCOUNTER — Other Ambulatory Visit (HOSPITAL_COMMUNITY)
Admission: RE | Admit: 2013-06-02 | Discharge: 2013-06-02 | Disposition: A | Discharge status: Home / Self Care | Payer: No Typology Code available for payment source | Source: Ambulatory Visit | Attending: Family Medicine | Admitting: Family Medicine

## 2013-06-02 VITALS — BP 138/79 | HR 90 | Temp 99.6°F | Ht 66.5 in | Wt 311.0 lb

## 2013-06-02 DIAGNOSIS — Z113 Encounter for screening for infections with a predominantly sexual mode of transmission: Secondary | ICD-10-CM | POA: Insufficient documentation

## 2013-06-02 DIAGNOSIS — N760 Acute vaginitis: Secondary | ICD-10-CM

## 2013-06-02 DIAGNOSIS — Z0000 Encounter for general adult medical examination without abnormal findings: Secondary | ICD-10-CM

## 2013-06-02 LAB — HIV ANTIBODY (ROUTINE TESTING W REFLEX): HIV: NONREACTIVE

## 2013-06-02 NOTE — Progress Notes (Signed)
Sara Taylor is a 23 y.o. female who presents today for STD screening, physical.    Pt does not have complaints today, would like testing for STD's.    Past Medical History  Diagnosis Date  . Hypertension     History  Smoking status  . Current Every Day Smoker -- 0.20 packs/day  . Types: Cigarettes  Smokeless tobacco  . Not on file    Family History  Problem Relation Age of Onset  . Hypertension Maternal Grandmother   . Hypertension Maternal Grandfather   . Hypertension Paternal Grandmother   . Diabetes Paternal Grandmother     Current Outpatient Prescriptions on File Prior to Visit  Medication Sig Dispense Refill  . hydrochlorothiazide (HYDRODIURIL) 25 MG tablet Take 0.5 tablets (12.5 mg total) by mouth daily.  90 tablet  3  . tretinoin (RETIN-A) 0.025 % cream Apply topically at bedtime.  45 g  0   No current facility-administered medications on file prior to visit.    ROS: Per HPI.  All other systems reviewed and are negative.   Physical Exam Filed Vitals:   06/02/13 1449  BP: 138/79  Pulse: 90  Temp: 99.6 F (37.6 C)    Physical Examination: General appearance - alert, well appearing, and in no distress Chest - clear to auscultation, no wheezes, rales or rhonchi, symmetric air entry Heart - normal rate and regular rhythm, no murmurs appreciated   Lab Results  Component Value Date   HGBA1C 5.0 03/09/2012

## 2013-06-02 NOTE — Assessment & Plan Note (Signed)
STD check today, counseling on tobacco, safe sex practices, seat belt, and healthier eating/weight loss.  F/U in one yr, to call with results.

## 2013-06-02 NOTE — Assessment & Plan Note (Signed)
HIV, RPR, GC/Chlamydia today.  Call pt w/ results.

## 2013-06-02 NOTE — Patient Instructions (Signed)
Sexually Transmitted Disease A sexually transmitted disease (STD) is a disease or infection that may be passed (transmitted) from person to person, usually during sexual activity. This may happen by way of saliva, semen, blood, vaginal mucus, or urine. Common STDs include:   Gonorrhea.   Chlamydia.   Syphilis.   HIV and AIDS.   Genital herpes.   Hepatitis B and C.   Trichomonas.   Human papillomavirus (HPV).   Pubic lice.   Scabies.  Mites.  Bacterial vaginosis. WHAT ARE CAUSES OF STDs? An STD may be caused by bacteria, a virus, or parasites. STDs are often transmitted during sexual activity if one person is infected. However, they may also be transmitted through nonsexual means. STDs may be transmitted after:   Sexual intercourse with an infected person.   Sharing sex toys with an infected person.   Sharing needles with an infected person or using unclean piercing or tattoo needles.  Having intimate contact with the genitals, mouth, or rectal areas of an infected person.   Exposure to infected fluids during birth. WHAT ARE THE SIGNS AND SYMPTOMS OF STDs? Different STDs have different symptoms. Some people may not have any symptoms. If symptoms are present, they may include:   Painful or bloody urination.   Pain in the pelvis, abdomen, vagina, anus, throat, or eyes.   Skin rash, itching, irritation, growths, sores (lesions), ulcerations, or warts in the genital or anal area.  Abnormal vaginal discharge with or without bad odor.   Penile discharge in men.   Fever.   Pain or bleeding during sexual intercourse.   Swollen glands in the groin area.   Yellow skin and eyes (jaundice). This is seen with hepatitis.   Swollen testicles.  Infertility.  Sores and blisters in the mouth. HOW ARE STDs DIAGNOSED? To make a diagnosis, your health care provider may:   Take a medical history.   Perform a physical exam.   Take a sample of any  discharge for examination.  Swab the throat, cervix, opening to the penis, rectum, or vagina for testing.  Test a sample of your first morning urine.   Perform blood tests.   Perform a Pap smear, if this applies.   Perform a colposcopy.   Perform a laparoscopy.  HOW ARE STDs TREATED? Treatment depends on the STD. Some STDs may be treated but not cured.   Chlamydia, gonorrhea, trichomonas, and syphilis can be cured with antibiotics.   Genital herpes, hepatitis, and HIV can be treated, but not cured, with prescribed medicines. The medicines lessen symptoms.   Genital warts from HPV can be treated with medicine or by freezing, burning (electrocautery), or surgery. Warts may come back.   HPV cannot be cured with medicine or surgery. However, abnormal areas may be removed from the cervix, vagina, or vulva.   If your diagnosis is confirmed, your recent sexual partners need treatment. This is true even if they are symptom-free or have a negative culture or evaluation. They should not have sex until their health care providers say it is OK. HOW CAN I REDUCE MY RISK OF GETTING AN STD?  Use latex condoms, dental dams, and water-soluble lubricants during sexual activity. Do not use petroleum jelly or oils.  Get vaccinated for HPV and hepatitis. If you have not received these vaccines in the past, talk to your health care provider about whether one or both might be right for you.   Avoid risky sex practices that can break the skin.  WHAT SHOULD   I DO IF I THINK I HAVE AN STD?  See your health care provider.   Inform all sexual partners. They should be tested and treated for any STDs.  Do not have sex until your health care provider says it is OK. WHEN SHOULD I GET HELP? Seek immediate medical care if:  You develop severe abdominal pain.  You are a man and notice swelling or pain in the testicles.  You are a woman and notice swelling or pain in your vagina. Document  Released: 07/20/2002 Document Revised: 02/17/2013 Document Reviewed: 11/17/2012 [employer] Patient Information 2014 [employer].  

## 2013-06-03 ENCOUNTER — Encounter: Payer: Self-pay | Admitting: Family Medicine

## 2013-06-03 ENCOUNTER — Telehealth: Payer: Self-pay | Admitting: Family Medicine

## 2013-06-03 LAB — RPR

## 2013-06-03 NOTE — Telephone Encounter (Signed)
LVM for pt to call us back.  Please let pt know her results for HIV, syphillis, gonorrhea, and chlamydia were all negative.  Thanks, Twana FirstBryan R. Paulina FusiHess, DO [facility] 06/03/2013, 1:24 PM

## 2013-08-14 ENCOUNTER — Emergency Department (HOSPITAL_COMMUNITY)
Admission: EM | Admit: 2013-08-14 | Discharge: 2013-08-14 | Disposition: A | Discharge status: Home / Self Care | Payer: No Typology Code available for payment source | Attending: Emergency Medicine | Admitting: Emergency Medicine

## 2013-08-14 ENCOUNTER — Encounter (HOSPITAL_COMMUNITY): Payer: Self-pay | Admitting: Emergency Medicine

## 2013-08-14 DIAGNOSIS — K089 Disorder of teeth and supporting structures, unspecified: Secondary | ICD-10-CM | POA: Insufficient documentation

## 2013-08-14 DIAGNOSIS — Z79899 Other long term (current) drug therapy: Secondary | ICD-10-CM | POA: Insufficient documentation

## 2013-08-14 DIAGNOSIS — K029 Dental caries, unspecified: Secondary | ICD-10-CM | POA: Insufficient documentation

## 2013-08-14 DIAGNOSIS — I10 Essential (primary) hypertension: Secondary | ICD-10-CM | POA: Insufficient documentation

## 2013-08-14 DIAGNOSIS — F17200 Nicotine dependence, unspecified, uncomplicated: Secondary | ICD-10-CM | POA: Insufficient documentation

## 2013-08-14 DIAGNOSIS — K0889 Other specified disorders of teeth and supporting structures: Secondary | ICD-10-CM

## 2013-08-14 MED ORDER — HYDROCODONE-ACETAMINOPHEN 5-325 MG PO TABS
1.0000 | ORAL_TABLET | Freq: Once | ORAL | Status: AC
  Administered 2013-08-14: 1 via ORAL
  Filled 2013-08-14: qty 1

## 2013-08-14 MED ORDER — PENICILLIN V POTASSIUM 500 MG PO TABS: 500.0000 mg | ORAL_TABLET | Freq: Four times a day (QID) | ORAL | Status: AC

## 2013-08-14 MED ORDER — HYDROCODONE-ACETAMINOPHEN 5-325 MG PO TABS: 1.0000 | ORAL_TABLET | ORAL | Status: DC | PRN

## 2013-08-14 MED ORDER — IBUPROFEN 800 MG PO TABS: 800.0000 mg | ORAL_TABLET | Freq: Three times a day (TID) | ORAL | Status: DC

## 2013-08-14 NOTE — ED Notes (Signed)
Pt ED with c/o left sided dental pain, onset yesterday, swelling noted at left face. Pt states " it hurts to chew."

## 2013-08-14 NOTE — ED Provider Notes (Signed)
CSN: [id number]     Arrival date & time 08/14/13  1301 History  This chart was scribed for non-physician practitioner, Coral CeoJessica Johnnie Moten, PA-C working with Ethelda ChickMartha K Linker, MD by Greggory StallionKayla Andersen, ED scribe. This patient was seen in room TR06C/TR06C and the patient's care was started at 1:59 PM.   Chief Complaint  Patient presents with  . Dental Pain   The history is provided by the patient. No language interpreter was used.   HPI Comments: Sara Taylor is a 23 y.o. female who presents to the Emergency Department complaining of sudden onset, constant aching left lower dental pain that started last night. Pt was eating ice cream when the pain started. Chewing worsens the pain. She has taken ibuprofen and used Orajel with no relief. Denies fever, fatigue, difficulty swallowing or breathing. Pt smokes cigarettes daily.    Past Medical History  Diagnosis Date  . Hypertension    No past surgical history on file. Family History  Problem Relation Age of Onset  . Hypertension Maternal Grandmother   . Hypertension Maternal Grandfather   . Hypertension Paternal Grandmother   . Diabetes Paternal Grandmother    History  Substance Use Topics  . Smoking status: Current Every Day Smoker -- 0.20 packs/day    Types: Cigarettes  . Smokeless tobacco: Not on file  . Alcohol Use: Yes     Comment: less than monthly   OB History   Grav Para Term Preterm Abortions TAB SAB Ect Mult Living                 Review of Systems  Constitutional: Negative for fever and fatigue.  HENT: Positive for dental problem. Negative for trouble swallowing.   All other systems reviewed and are negative.   Allergies  Review of patient's allergies indicates no known allergies.  Home Medications   Current Outpatient Rx  Name  Route  Sig  Dispense  Refill  . hydrochlorothiazide (HYDRODIURIL) 25 MG tablet   Oral   Take 0.5 tablets (12.5 mg total) by mouth daily.   90 tablet   3   . tretinoin (RETIN-A) 0.025 %  cream   Topical   Apply topically at bedtime.   45 g   0    BP 132/82  Pulse 77  Temp(Src) 97.5 F (36.4 C)  Resp 18  Ht 5\' 6"  (1.676 m)  SpO2 100%  Filed Vitals:   08/14/13 1313  BP: 132/82  Pulse: 77  Temp: 97.5 F (36.4 C)  Resp: 18  Height: 5\' 6"  (1.676 m)  SpO2: 100%    Physical Exam  Nursing note and vitals reviewed. Constitutional: She is oriented to person, place, and time. She appears well-developed and well-nourished. No distress.  HENT:  Head: Normocephalic and atraumatic.  Mouth/Throat:    Dental cary in the left lower 3rd molar. No surrounding edema or abscess. No facial edema or masses. No erythema to the posterior pharynx. Tonsils without edema or exudates. Uvula midline. No trismus. No difficulty controlling secretions. Tympanic membranes gray and translucent bilaterally with no erythema, edema, or hemotympanum.  No mastoid or tragal tenderness bilaterally.  Eyes: EOM are normal.  Neck: Neck supple. No tracheal deviation present.  No cervical lymphadenopathy. No nuchal rigidity. No submental fullness.   Cardiovascular: Normal rate, regular rhythm and normal heart sounds.  Exam reveals no gallop and no friction rub.   No murmur heard. Pulmonary/Chest: Effort normal and breath sounds normal. No respiratory distress. She has no wheezes. She has  no rales.  Musculoskeletal: Normal range of motion.  Neurological: She is alert and oriented to person, place, and time.  Skin: Skin is warm and dry.  Psychiatric: She has a normal mood and affect. Her behavior is normal.    ED Course  Procedures (including critical care time)  DIAGNOSTIC STUDIES: Oxygen Saturation is 100% on RA, normal by my interpretation.    COORDINATION OF CARE: 2:02 PM-Discussed treatment plan which includes penicillin, a short course of Vicodin and continuing ibuprofen with pt at bedside and pt agreed to plan. Advised pt to follow up with a dentist.   Labs Review Labs Reviewed - No  data to display Imaging Review No results found.   EKG Interpretation None      MDM   Sara Taylor is a 23 y.o. female who presents to the Emergency Department complaining of sudden onset, constant aching left lower dental pain that started last night. Etiology of dental pain likely due to dental cary.  No concerning signs/symptoms for Ludwig's Angina at this time.  Patient afebrile and non-toxic in appearance.  Patient instructed to follow-up with a dentist for further evaluation and management.  Resources provided.  Return precautions were given.  Patient in agreement with discharge and plan.    Discharge Medication List as of 08/14/2013  2:07 PM    START taking these medications   Details  HYDROcodone-acetaminophen (NORCO/VICODIN) 5-325 MG per tablet Take 1 tablet by mouth every 4 (four) hours as needed., Starting 08/14/2013, Until Discontinued, Print    ibuprofen (ADVIL,MOTRIN) 800 MG tablet Take 1 tablet (800 mg total) by mouth 3 (three) times daily., Starting 08/14/2013, Until Discontinued, Print    penicillin v potassium (VEETID) 500 MG tablet Take 1 tablet (500 mg total) by mouth 4 (four) times daily., Starting 08/14/2013, Last dose on Sat 08/21/13, Print         Final impressions: 1. Pain, dental       Thomasenia Sales   I personally performed the services described in this documentation, which was scribed in my presence. The recorded information has been reviewed and is accurate.  Jillyn Ledger, PA-C 08/15/13 1347

## 2013-08-14 NOTE — Discharge Instructions (Signed)
Take ibuprofen for mild-moderate pain - this will help with inflammation and swelling  Take Vicodin for severe pain - Please be careful with this medication.  It can cause drowsiness.  Use caution while driving, operating machinery, drinking alcohol, or any other activities that may impair your physical or mental abilities.   Return to the emergency department if you develop any changing/worsening condition, fever, difficulty swallowing/breathing, or any other concerns (please read additional information regarding your condition below)   Dental Pain A tooth ache may be caused by cavities (tooth decay). Cavities expose the nerve of the tooth to air and hot or cold temperatures. It may come from an infection or abscess (also called a boil or furuncle) around your tooth. It is also often caused by dental caries (tooth decay). This causes the pain you are having. DIAGNOSIS  Your caregiver can diagnose this problem by exam. TREATMENT   If caused by an infection, it may be treated with medications which kill germs (antibiotics) and pain medications as prescribed by your caregiver. Take medications as directed.  Only take over-the-counter or prescription medicines for pain, discomfort, or fever as directed by your caregiver.  Whether the tooth ache today is caused by infection or dental disease, you should see your dentist as soon as possible for further care. SEEK MEDICAL CARE IF: The exam and treatment you received today has been provided on an emergency basis only. This is not a substitute for complete medical or dental care. If your problem worsens or new problems (symptoms) appear, and you are unable to meet with your dentist, call or return to this location. SEEK IMMEDIATE MEDICAL CARE IF:   You have a fever.  You develop redness and swelling of your face, jaw, or neck.  You are unable to open your mouth.  You have severe pain uncontrolled by pain medicine. MAKE SURE YOU:   Understand  these instructions.  Will watch your condition.  Will get help right away if you are not doing well or get worse. Document Released: 04/29/2005 Document Revised: 07/22/2011 Document Reviewed: 12/16/2007 [facility] Patient Information 2014 [place], [place].  Dental Caries  Dental caries (also called tooth decay) is the most common oral disease. It can occur at any age, but is more common in children and young adults.  HOW DENTAL CARIES DEVELOPS  The process of decay begins when bacteria and foods (particularly sugars and starches) combine in your mouth to produce plaque. Plaque is a substance that sticks to the hard, outer surface of a tooth (enamel). The bacteria in plaque produce acids that attack enamel. These acids may also attack the root surface of a tooth (cementum) if it is exposed. Repeated attacks dissolve these surfaces and create holes in the tooth (cavities). If left untreated, the acids destroy the other layers of the tooth.  RISK FACTORS  Frequent sipping of sugary beverages.   Frequent snacking on sugary and starchy foods, especially those that easily get stuck in the teeth.   Poor oral hygiene.   Dry mouth.   Substance abuse such as methamphetamine abuse.   Broken or poor-fitting dental restorations.   Eating disorders.   Gastroesophageal reflux disease (GERD).   Certain radiation treatments to the head and neck. SYMPTOMS In the early stages of dental caries, symptoms are seldom present. Sometimes white, chalky areas may be seen on the enamel or other tooth layers. In later stages, symptoms may include:  Pits and holes on the enamel.  Toothache after sweet, hot, or cold  foods or drinks are consumed.  Pain around the tooth.  Swelling around the tooth. DIAGNOSIS  Most of the time, dental caries is detected during a regular dental checkup. A diagnosis is made after a thorough medical and dental history is taken and the surfaces of your teeth are checked for  signs of dental caries. Sometimes special instruments, such as lasers, are used to check for dental caries. Dental X-ray exams may be taken so that areas not visible to the eye (such as between the contact areas of the teeth) can be checked for cavities.  TREATMENT  If dental caries is in its early stages, it may be reversed with a fluoride treatment or an application of a remineralizing agent at the dental office. Thorough brushing and flossing at home is needed to aid these treatments. If it is in its later stages, treatment depends on the location and extent of tooth destruction:   If a small area of the tooth has been destroyed, the destroyed area will be removed and cavities will be filled with a material such as gold, silver amalgam, or composite resin.   If a large area of the tooth has been destroyed, the destroyed area will be removed and a cap (crown) will be fitted over the remaining tooth structure.   If the center part of the tooth (pulp) is affected, a procedure called a root canal will be needed before a filling or crown can be placed.   If most of the tooth has been destroyed, the tooth may need to be pulled (extracted). HOME CARE INSTRUCTIONS You can prevent, stop, or reverse dental caries at home by practicing good oral hygiene. Good oral hygiene includes:  Thoroughly cleaning your teeth at least twice a day with a toothbrush and dental floss.   Using a fluoride toothpaste. A fluoride mouth rinse may also be used if recommended by your dentist or health care provider.   Restricting the amount of sugary and starchy foods and sugary liquids you consume.   Avoiding frequent snacking on these foods and sipping of these liquids.   Keeping regular visits with a dentist for checkups and cleanings. PREVENTION   Practice good oral hygiene.  Consider a dental sealant. A dental sealant is a coating material that is applied by your dentist to the pits and grooves of teeth. The  sealant prevents food from being trapped in them. It may protect the teeth for several years.  Ask about fluoride supplements if you live in a community without fluorinated water or with water that has a low fluoride content. Use fluoride supplements as directed by your dentist or health care provider.  Allow fluoride varnish applications to teeth if directed by your dentist or health care provider. Document Released: 01/19/2002 Document Revised: 12/30/2012 Document Reviewed: 05/01/2012 [facility] Patient Information 2014 [place], [place].   Emergency Department Resource Guide 1) Find a Doctor and Pay Out of Pocket Although you won't have to find out who is covered by your insurance plan, it is a good idea to ask around and get recommendations. You will then need to call the office and see if the doctor you have chosen will accept you as a new patient and what types of options they offer for patients who are self-pay. Some doctors offer discounts or will set up payment plans for their patients who do not have insurance, but you will need to ask so you aren't surprised when you get to your appointment.  2) Contact Your Local Health  Department Not all health departments have doctors that can see patients for sick visits, but many do, so it is worth a call to see if yours does. If you don't know where your local health department is, you can check in your phone book. The CDC also has a tool to help you locate your state's health department, and many state websites also have listings of all of their local health departments.  3) Find a Walk-in Clinic If your illness is not likely to be very severe or complicated, you may want to try a walk in clinic. These are popping up all over the country in pharmacies, drugstores, and shopping centers. They're usually staffed by nurse practitioners or physician assistants that have been trained to treat common illnesses and complaints. They're usually fairly quick and  inexpensive. However, if you have serious medical issues or chronic medical problems, these are probably not your best option.  No Primary Care Doctor: - Call Health Connect at  [phone] - they can help you locate a primary care doctor that  accepts your insurance, provides certain services, etc. - Physician Referral Service- [phone]  Chronic Pain Problems: Organization         Address  Phone   Notes  [facility]  [phone] Patients need to be referred by their primary care doctor.   Medication Assistance: Organization         Address  Phone   Notes  [facility] Medication [facility] [address] [place] [place], [place] [address] [phone] --Must be a resident of [facility] -- Must have NO insurance coverage whatsoever (no Medicaid/ Medicare, etc.) -- The pt. MUST have a primary care doctor that directs their care regularly and follows them in the community   [facility]ist  [phone]   [employer]  [phone]    Agencies that provide inexpensive medical care: Organization         Address  Phone   Notes  [facility]  [phone]   [facility]    [phone]   [facility] [address] [place], [place] [address] [phone]   [facility] [address] [place] [place] [phone]   Planned Parenthood    [phone]   [facility]    [phone]   [facility]  [address], [place] Phone:  [phone], Fax:  [phone] Hours of Operation:  9 am - 6 pm, M-F.  Also accepts Medicaid/Medicare and self-pay.  [facility]  [address], [place] Phone: [phone], Fax: [phone]. Hours of Operation:  8:30 am - 5:30 pm, M-F.  Also accepts Medicaid and self-pay.  [facility] [place] [address], [place] Phone: [phone]   [facility] [address] Natasha Bence [place], [place] [phone], Ext. [phone] Mondays & Thursdays: 7-9 AM.  First 15 patients are seen on a first come, first serve basis.    Medicaid-accepting [facility] Providers:  Organization         Address  Phone   Notes  [facility] [address], [place] [phone] Also accepts self-pay patients.  [facility] [address] Laurell Josephs [place], [place]  [phone]   [facility] [address], [place] [phone]  [facility] [address], [place] [phone]   Renaye Rakers [address], [place]   [phone] Only accepts [place] Access [place] patients after they have their name applied to their card.   Self-Pay (no insurance) in [facility]:  Organization         Address  Phone   Notes  Sickle Cell Patients, [facility] [address] [place], [place] [phone]   [facility] [address] [place], [place] [phone]   [facility] [place]  [address], [place] [phone]   [facility]/Dr. Osei-Bonsu  [address], [place] or [address], [place] [phone] Phone number for both [place] and [place] locations is the same.  [facility] [address], [place] [phone]   [facility] [address], [place] or [address] [phone]   [facility] [address] [place] [phone], phone; [phone], fax Sees patients 1st and 3rd Saturday of every month.  Must not qualify for public or private insurance (i.e. Medicaid, Medicare, [place] Health Choice, Veterans' Benefits)  Household income should be no more than 200% of the poverty level The clinic cannot treat you if you are pregnant or  think you are pregnant  Sexually transmitted diseases are not treated at the clinic.    Dental Care: Organization         Address  Phone  Notes  [facility] of [facility] [address] [place], [place] [phone] Accepts children up to age 32 who are enrolled in [place] or [place] Health Choice; pregnant women with a Medicaid card; and children who have applied for Medicaid or [place] Health Choice, but were declined, whose parents can pay a reduced fee at time of service.  [facility] Department of [facility]  [address], [place] [phone] Accepts children up to age 51 who are enrolled in [place] or [place] Health Choice; pregnant women with a Medicaid card; and children who have applied for Medicaid or  Health Choice, but were declined, whose parents can pay a reduced fee at time of service.  [facility]  [address] [place], [place] [phone] Patients are seen by appointment only. Walk-ins are not accepted. [facility] will see patients 64 years of age and older. Monday - Tuesday (8am-5pm) Most Wednesdays (8:30-5pm) $30 per visit, cash only  [facility] Adult Dental Access [facility]  [address], [place] [facility] [phone] Patients are seen by appointment only. Walk-ins are not accepted. [facility] will see patients 65 years of age and older. One Wednesday Evening (Monthly: Volunteer Based).  $30 per visit, cash only  [employer]  [phone] for adults; Children under age 73, call Graduate Pediatric Dentistry at [phone]. Children aged 84-14, please call [phone] to request a pediatric application.  Dental services are provided in all areas of dental care including fillings, crowns and bridges, complete and partial dentures, implants, gum treatment, root canals, and extractions. Preventive care is also provided. Treatment is provided to both adults  and children. Patients are selected via a lottery and there is often a waiting list.   [facility] [address] [place]  [phone] [email or web address]  [facility] [address] [place], [place] [phone], Ext. [phone] Second and Fourth Thursday of each month, opens at 6:30 AM; Clinic ends at 9 AM.  Patients are seen on a first-come first-served basis, and a limited number are seen during each clinic.   [facility]  [address] [place], [place] [phone]   Eligibility Requirements You must have lived in [place], [place], or [place] counties for at least the last three months.   You cannot be eligible for state or federal sponsored [place], including [employer], [place], or [employer].   You generally cannot be eligible for healthcare insurance through your employer.    How to apply: Eligibility screenings are held every Tuesday and Wednesday afternoon from 1:00 pm until 4:00 pm. You do not need an appointment for the interview!  [facility] [address], [place], [place] [phone]   [facility]  [phone]   [facility]  [phone]   [facility] Health Department  [phone]    Behavioral Health Resources in the Community: Intensive Outpatient Programs Organization         Address  Phone  Notes  [facility] [address] [place], [place] [phone]   [facility] Outpatient [address], [place], [place] [phone]   [facility] [address] [place], [place]  [phone]   [facility] [address],  [place], [place] [phone] or [phone]   Substance Abuse Resources Organization         Address  Phone  Notes  [facility] Drug Services  [phone]   [facility]  [phone]   [facility]  [phone]     Floydene Flock  [phone]   Residential & Outpatient Substance Abuse Program  [phone]   Psychological Services Organization         Address  Phone  Notes  [facility]  [phone]   [facility]  [phone]   [facility] [address], [place] [phone] or [phone]    Mobile Crisis Teams Organization         Address  Phone  Notes  [facility], Mobile Crisis Care Unit  [phone]   [facility]  [address] [place], [place] [phone]   Doristine Locks [address] [place] [phone]    Self-Help/Support Groups Organization         Address  Phone             Notes  [facility] [place] - variety of support groups  336- I7437963 Call for more information  Narcotics Anonymous (NA), [facility] [address], [employer] [place]  2 meetings at this location   Statistician         Address  Phone  Notes  [facility] [address],    [place]  [phone]   [facility]  [address], [place] [address], [place], [place] [phone]   [facility] [address] [place], [place] [phone] Admissions: 8am-3pm M-F  [facility] [address]    [place], [place] [phone]   [facility] [address] Starling Manns [place], [place] [phone]   [facility] [address],  [place], [place] [phone]   [facility] - Intensive Outpatient [phone] Laurell Joseph[address], [place], [place] [phone]   [facility] ([facility] [address] [place]  [place], [place] [phone] or [phone]   [facility] ([facility]) [address], [place], [place] [phone] Accepts Medicaid  [facility] [address]  [place] [phone] Substance Abuse/Addiction Treatment   [facility] Organization         Address  Phone  Notes  [facility]  [phone]   Angie Fava, PhD [address] Ervin Knack [place], [place]   [phone] or [phone]   [facility]   [address] [place], [place] [phone]   [facility] [address], [place], [place] [phone] Insurance/Medicaid/sponsorship through [facility] [address]                                    [place], [place] [phone] Therapy/tele-psych/case  [facility] [address], [place] [phone]    Dr. Lolly Mustache  [phone]   [facility] [place]  [employer] [facility] Dept. 1) [address], [place] 2) [address] [place] 3)  [address], [place] [phone]  [phone]   [facility] Child Abuse Hotline 563-240-1223 or [phone] (After Hours)

## 2013-08-15 NOTE — ED Provider Notes (Signed)
Medical screening examination/treatment/procedure(s) were performed by non-physician practitioner and as supervising physician I was immediately available for consultation/collaboration.   EKG Interpretation None       Ethelda ChickMartha K Linker, MD 08/15/13 1351

## 2013-09-24 ENCOUNTER — Emergency Department (HOSPITAL_COMMUNITY)
Admission: EM | Admit: 2013-09-24 | Discharge: 2013-09-25 | Disposition: A | Discharge status: Home / Self Care | Payer: No Typology Code available for payment source | Attending: Emergency Medicine | Admitting: Emergency Medicine

## 2013-09-24 ENCOUNTER — Encounter (HOSPITAL_COMMUNITY): Payer: Self-pay | Admitting: Emergency Medicine

## 2013-09-24 DIAGNOSIS — A599 Trichomoniasis, unspecified: Secondary | ICD-10-CM | POA: Insufficient documentation

## 2013-09-24 DIAGNOSIS — N730 Acute parametritis and pelvic cellulitis: Secondary | ICD-10-CM

## 2013-09-24 DIAGNOSIS — F17200 Nicotine dependence, unspecified, uncomplicated: Secondary | ICD-10-CM | POA: Insufficient documentation

## 2013-09-24 DIAGNOSIS — R110 Nausea: Secondary | ICD-10-CM | POA: Insufficient documentation

## 2013-09-24 DIAGNOSIS — I10 Essential (primary) hypertension: Secondary | ICD-10-CM | POA: Insufficient documentation

## 2013-09-24 DIAGNOSIS — Z79899 Other long term (current) drug therapy: Secondary | ICD-10-CM | POA: Insufficient documentation

## 2013-09-24 DIAGNOSIS — Z3202 Encounter for pregnancy test, result negative: Secondary | ICD-10-CM | POA: Insufficient documentation

## 2013-09-24 LAB — WET PREP, GENITAL
Clue Cells Wet Prep HPF POC: NONE SEEN
YEAST WET PREP: NONE SEEN

## 2013-09-24 LAB — CBC WITH DIFFERENTIAL/PLATELET
BASOS ABS: 0 10*3/uL (ref 0.0–0.1)
Basophils Relative: 0 % (ref 0–1)
Eosinophils Absolute: 0.1 10*3/uL (ref 0.0–0.7)
Eosinophils Relative: 1 % (ref 0–5)
HEMATOCRIT: 38.9 % (ref 36.0–46.0)
HEMOGLOBIN: 13.1 g/dL (ref 12.0–15.0)
LYMPHS PCT: 34 % (ref 12–46)
Lymphs Abs: 2.2 10*3/uL (ref 0.7–4.0)
MCH: 30.4 pg (ref 26.0–34.0)
MCHC: 33.7 g/dL (ref 30.0–36.0)
MCV: 90.3 fL (ref 78.0–100.0)
MONO ABS: 0.3 10*3/uL (ref 0.1–1.0)
Monocytes Relative: 4 % (ref 3–12)
Neutro Abs: 4 10*3/uL (ref 1.7–7.7)
Neutrophils Relative %: 61 % (ref 43–77)
Platelets: 180 10*3/uL (ref 150–400)
RBC: 4.31 MIL/uL (ref 3.87–5.11)
RDW: 13.3 % (ref 11.5–15.5)
WBC: 6.5 10*3/uL (ref 4.0–10.5)

## 2013-09-24 LAB — COMPREHENSIVE METABOLIC PANEL
ALBUMIN: 3.7 g/dL (ref 3.5–5.2)
ALK PHOS: 68 U/L (ref 39–117)
ALT: 19 U/L (ref 0–35)
AST: 21 U/L (ref 0–37)
BUN: 13 mg/dL (ref 6–23)
CO2: 25 mEq/L (ref 19–32)
Calcium: 9.2 mg/dL (ref 8.4–10.5)
Chloride: 103 mEq/L (ref 96–112)
Creatinine, Ser: 0.96 mg/dL (ref 0.50–1.10)
GFR calc Af Amer: >90 mL/min (ref >90)
GFR calc non Af Amer: 83 mL/min — ABNORMAL LOW (ref >90)
GLUCOSE: 107 mg/dL — AB (ref 70–99)
POTASSIUM: 4.1 meq/L (ref 3.7–5.3)
Sodium: 141 mEq/L (ref 137–147)
TOTAL PROTEIN: 7.9 g/dL (ref 6.0–8.3)
Total Bilirubin: <0.2 mg/dL — ABNORMAL LOW (ref 0.3–1.2)

## 2013-09-24 LAB — URINALYSIS, ROUTINE W REFLEX MICROSCOPIC
BILIRUBIN URINE: NEGATIVE
GLUCOSE, UA: NEGATIVE mg/dL
Hgb urine dipstick: NEGATIVE
KETONES UR: NEGATIVE mg/dL
NITRITE: NEGATIVE
PH: 6.5 (ref 5.0–8.0)
Protein, ur: NEGATIVE mg/dL
SPECIFIC GRAVITY, URINE: 1.02 (ref 1.005–1.030)
Urobilinogen, UA: 1 mg/dL (ref 0.0–1.0)

## 2013-09-24 LAB — URINE MICROSCOPIC-ADD ON

## 2013-09-24 LAB — POC URINE PREG, ED: Preg Test, Ur: NEGATIVE

## 2013-09-24 MED ORDER — CEFTRIAXONE SODIUM 250 MG IJ SOLR
250.0000 mg | Freq: Once | INTRAMUSCULAR | Status: AC
Start: 2013-09-24 — End: 2013-09-24
  Administered 2013-09-24: 250 mg via INTRAMUSCULAR
  Filled 2013-09-24: qty 250

## 2013-09-24 MED ORDER — SODIUM CHLORIDE 0.9 % IV BOLUS (SEPSIS): 1000.0000 mL | Freq: Once | INTRAVENOUS | Status: DC

## 2013-09-24 MED ORDER — METRONIDAZOLE 500 MG PO TABS
2000.0000 mg | ORAL_TABLET | Freq: Once | ORAL | Status: AC
  Administered 2013-09-24: 2000 mg via ORAL
  Filled 2013-09-24: qty 4

## 2013-09-24 MED ORDER — AZITHROMYCIN 250 MG PO TABS
1000.0000 mg | ORAL_TABLET | Freq: Once | ORAL | Status: AC
  Administered 2013-09-24: 1000 mg via ORAL
  Filled 2013-09-24: qty 4

## 2013-09-24 MED ORDER — DOXYCYCLINE HYCLATE 100 MG PO CAPS: 100.0000 mg | ORAL_CAPSULE | Freq: Two times a day (BID) | ORAL | Status: DC

## 2013-09-24 MED ORDER — LIDOCAINE HCL (PF) 1 % IJ SOLN
INTRAMUSCULAR | Status: AC
  Administered 2013-09-24: 2 mL
  Filled 2013-09-24: qty 5

## 2013-09-24 NOTE — ED Provider Notes (Signed)
CSN: [id number]     Arrival date & time 09/24/13  1829 History   First MD Initiated Contact with Patient 09/24/13 2041     Chief Complaint  Patient presents with  . Abdominal Pain     (Consider location/radiation/quality/duration/timing/severity/associated sxs/prior Treatment) The history is provided by the patient and medical records. No language interpreter was used.    Sara Taylor is a 23 y.o. female  with a hx of HTN presents to the Emergency Department complaining of gradual, persistent, progressively worsening lower abd pain with associated vaginal discharge onset 2 days ago.  Pt reports clear/yellow vaginal discharge.  Pt reports her lower abd pain is waxing and waning, cramping in nature.  Pt reports taking aleve without relief.  . Associated symptoms include dysuria, hematuria.  Pt reports sleeping alleviates the pain and then it returns, but nothing specifically makes it worse.   Pt denies fever, chills, headache, neck pain, chest pain, SOB, N/V/D, weakness, dizziness, syncope.  LMP: April 22     Past Medical History  Diagnosis Date  . Hypertension    History reviewed. No pertinent past surgical history. Family History  Problem Relation Age of Onset  . Hypertension Maternal Grandmother   . Hypertension Maternal Grandfather   . Hypertension Paternal Grandmother   . Diabetes Paternal Grandmother    History  Substance Use Topics  . Smoking status: Current Every Day Smoker -- 0.20 packs/day    Types: Cigarettes  . Smokeless tobacco: Not on file  . Alcohol Use: Yes     Comment: less than monthly   OB History   Grav Para Term Preterm Abortions TAB SAB Ect Mult Living                 Review of Systems  Constitutional: Negative for fever, diaphoresis, appetite change, fatigue and unexpected weight change.  HENT: Negative for mouth sores and trouble swallowing.   Respiratory: Negative for cough, chest tightness, shortness of breath, wheezing and stridor.    Cardiovascular: Negative for chest pain and palpitations.  Gastrointestinal: Positive for nausea and abdominal pain. Negative for vomiting, diarrhea, constipation, blood in stool, abdominal distention and rectal pain.  Genitourinary: Negative for dysuria, urgency, frequency, hematuria, flank pain and difficulty urinating.  Musculoskeletal: Negative for back pain, neck pain and neck stiffness.  Skin: Negative for rash.  Neurological: Negative for weakness.  Hematological: Negative for adenopathy.  Psychiatric/Behavioral: Negative for confusion.  All other systems reviewed and are negative.     Allergies  Review of patient's allergies indicates no known allergies.  Home Medications   Prior to Admission medications   Medication Sig Start Date End Date Taking? Authorizing Provider  hydrochlorothiazide (HYDRODIURIL) 25 MG tablet Take 12.5 mg by mouth daily.   Yes Historical Provider, MD   BP 120/55  Pulse 94  Temp(Src) 98.5 F (36.9 C) (Oral)  Resp 14  SpO2 96% Physical Exam  Nursing note and vitals reviewed. Constitutional: She is oriented to person, place, and time. She appears well-developed and well-nourished. No distress.  Awake, alert, nontoxic appearance  HENT:  Head: Normocephalic and atraumatic.  Mouth/Throat: Oropharynx is clear and moist. No oropharyngeal exudate.  Eyes: Conjunctivae are normal. No scleral icterus.  Neck: Normal range of motion. Neck supple.  Cardiovascular: Normal rate, regular rhythm, normal heart sounds and intact distal pulses.   No murmur heard. Pulmonary/Chest: Effort normal and breath sounds normal. No respiratory distress. She has no wheezes.  Abdominal: Soft. Bowel sounds are normal. She exhibits no distension  and no mass. There is tenderness in the suprapubic area. There is no rebound, no guarding and no CVA tenderness.  Mild superpubic abdominal tenderness without guarding, rebound or peritoneal signs No CVA tenderness  Genitourinary:  Uterus normal. There is no rash, tenderness, lesion or injury on the right labia. There is no rash, tenderness, lesion or injury on the left labia. Uterus is not deviated, not enlarged, not fixed and not tender. Cervix exhibits motion tenderness. Cervix exhibits no discharge and no friability. Right adnexum displays no mass, no tenderness and no fullness. Left adnexum displays no mass, no tenderness and no fullness. No erythema, tenderness or bleeding around the vagina. No foreign body around the vagina. No signs of injury around the vagina. Vaginal discharge (thin, green, frothy, copious) found.  Very mild cervical motion tenderness No mass, fullness tenderness to the bilateral adnexa  Musculoskeletal: Normal range of motion. She exhibits no edema.  Neurological: She is alert and oriented to person, place, and time. She exhibits normal muscle tone. Coordination normal.  Speech is clear and goal oriented Moves extremities without ataxia  Skin: Skin is warm and dry. No rash noted. She is not diaphoretic. No erythema.  Psychiatric: She has a normal mood and affect.    ED Course  Procedures (including critical care time) Labs Review Labs Reviewed  WET PREP, GENITAL - Abnormal; Notable for the following:    Trich, Wet Prep MANY (*)    WBC, Wet Prep HPF POC MANY (*)    All other components within normal limits  COMPREHENSIVE METABOLIC PANEL - Abnormal; Notable for the following:    Glucose, Bld 107 (*)    Total Bilirubin <0.2 (*)    GFR calc non Af Amer 83 (*)    All other components within normal limits  URINALYSIS, ROUTINE W REFLEX MICROSCOPIC - Abnormal; Notable for the following:    Leukocytes, UA MODERATE (*)    All other components within normal limits  URINE MICROSCOPIC-ADD ON - Abnormal; Notable for the following:    Squamous Epithelial / LPF FEW (*)    Bacteria, UA FEW (*)    All other components within normal limits  GC/CHLAMYDIA PROBE AMP  URINE CULTURE  CBC WITH DIFFERENTIAL   HIV ANTIBODY (ROUTINE TESTING)  POC URINE PREG, ED    Imaging Review No results found.   EKG Interpretation None      MDM   Final diagnoses:  PID (acute pelvic inflammatory disease)  Trichomonas infection   Shonya L Hizer presents with suprapubic abdominal pain for several days associated nausea but without vomiting. If the test negative. Urinalysis with moderate leukocytes and only 3-6 white blood cells. Pelvic exam with copious amounts of frothy green discharge.  Wet prep with trichomonas and many white blood cells.  Patient with very mild cervical motion tenderness and no adnexal tenderness.  CMP and CBC reassuring.  No leukocytosis.  Concern for possible early PID. Patient will be treated here in the emergency department with azithromycin, Rocephin and Flagyl. Plan to DC home with doxycycline. Urine culture sent.  Pt is afebrile, non-tachycardic, alert and oriented, nontoxic, nonseptic appearing. She is tolerating by mouth department without difficulty.    Patient is nontoxic, nonseptic appearing, in no apparent distress.   Labs and vitals reviewed.  Patient does not meet the SIRS or Sepsis criteria.  On repeat exam patient does not have a surgical abdomin and there are no peritoneal signs.  No indication of appendicitis, bowel obstruction, bowel perforation, cholecystitis, diverticulitis, ectopic  pregnancy.  Concern for mild PID, no concern for TOA.  Patient discharged home with symptomatic treatment and given strict instructions for follow-up with their OB/GYN.  I have also discussed reasons to return immediately to the ER including worsening pain, high fevers or intractable vomiting.  Patient expresses understanding and agrees with plan.  It has been determined that no acute conditions requiring further emergency intervention are present at this time. The patient/guardian have been advised of the diagnosis and plan. We have discussed signs and symptoms that warrant return to the  ED, such as changes or worsening in symptoms.   Vital signs are stable at discharge.   BP 120/55  Pulse 94  Temp(Src) 98.5 F (36.9 C) (Oral)  Resp 14  SpO2 96%  Patient/guardian has voiced understanding and agreed to follow-up with the PCP or specialist.      Dierdre Forth, PA-C 09/25/13 0017

## 2013-09-24 NOTE — ED Notes (Signed)
Pt states that she is having lower abdomen pain. Pt complaining of pain since Wed this past week. Pt states that she has been having vaginal discharge as well.

## 2013-09-24 NOTE — ED Notes (Signed)
PA at bedside with NT performing pelvic exam.

## 2013-09-24 NOTE — Discharge Instructions (Signed)
1. Medications: doxycycline, usual home medications 2. Treatment: rest, drink plenty of fluids,  3. Follow Up: Please followup with the women's outpatient clinic for further evaluation   Trichomoniasis Trichomoniasis is an infection, caused by the Trichomonas organism, that affects both women and men. In women, the outer female genitalia and the vagina are affected. In men, the penis is mainly affected, but the prostate and other reproductive organs can also be involved. Trichomoniasis is a sexually transmitted disease (STD) and is most often passed to another person through sexual contact. The majority of people who get trichomoniasis do so from a sexual encounter and are also at risk for other STDs. CAUSES   Sexual intercourse with an infected partner.  It can be present in swimming pools or hot tubs. SYMPTOMS   Abnormal gray-green frothy vaginal discharge in women.  Vaginal itching and irritation in women.  Itching and irritation of the area outside the vagina in women.  Penile discharge with or without pain in males.  Inflammation of the urethra (urethritis), causing painful urination.  Bleeding after sexual intercourse. RELATED COMPLICATIONS  Pelvic inflammatory disease.  Infection of the uterus (endometritis).  Infertility.  Tubal (ectopic) pregnancy.  It can be associated with other STDs, including gonorrhea and chlamydia, hepatitis B, and HIV. COMPLICATIONS DURING PREGNANCY  Early (premature) delivery.  Premature rupture of the membranes (PROM).  Low birth weight. DIAGNOSIS   Visualization of Trichomonas under the microscope from the vagina discharge.  Ph of the vagina greater than 4.5, tested with a test tape.  Trich Rapid Test.  Culture of the organism, but this is not usually needed.  It may be found on a Pap test.  Having a "strawberry cervix,"which means the cervix looks very red like a strawberry. TREATMENT   You may be given medication to fight  the infection. Inform your caregiver if you could be or are pregnant. Some medications used to treat the infection should not be taken during pregnancy.  Over-the-counter medications or creams to decrease itching or irritation may be recommended.  Your sexual partner will need to be treated if infected. HOME CARE INSTRUCTIONS   Take all medication prescribed by your caregiver.  Take over-the-counter medication for itching or irritation as directed by your caregiver.  Do not have sexual intercourse while you have the infection.  Do not douche or wear tampons.  Discuss your infection with your partner, as your partner may have acquired the infection from you. Or, your partner may have been the person who transmitted the infection to you.  Have your sex partner examined and treated if necessary.  Practice safe, informed, and protected sex.  See your caregiver for other STD testing. SEEK MEDICAL CARE IF:   You still have symptoms after you finish the medication.  You have an oral temperature above 102 F (38.9 C).  You develop belly (abdominal) pain.  You have pain when you urinate.  You have bleeding after sexual intercourse.  You develop a rash.  The medication makes you sick or makes you throw up (vomit). Document Released: 10/23/2000 Document Revised: 07/22/2011 Document Reviewed: 11/18/2008 [facility] IncExitCare Patient Information 2014 [employer]andy KitchenExitCare, MarylandLLC.    Pelvic Inflammatory Disease Pelvic inflammatory disease (PID) refers to an infection in some or all of the female organs. The infection can be in the uterus, ovaries, fallopian tubes, or the surrounding tissues in the pelvis. PID can cause abdominal or pelvic pain that comes on suddenly (acute pelvic pain). PID is a serious infection because it can lead  to lasting (chronic) pelvic pain or the inability to have children (infertile).  CAUSES  The infection is often caused by the normal bacteria found in the vaginal tissues. PID may  also be caused by an infection that is spread during sexual contact. PID can also occur following:   The birth of a baby.   A miscarriage.   An abortion.   Major pelvic surgery.   The use of an intrauterine device (IUD).   A sexual assault.  RISK FACTORS Certain factors can put a person at higher risk for PID, such as:  Being younger than 25 years.  Being sexually active at [place] age.  Usingnonbarrier contraception.  Havingmultiple sexual partners.  Having sex with someone who has symptoms of a genital infection.  Using oral contraception. Other times, certain behaviors can increase the possibility of getting PID, such as:  Having sex during your period.  Using a vaginal douche.  Having an intrauterine device (IUD) in place. SYMPTOMS   Abdominal or pelvic pain.   Fever.   Chills.   Abnormal vaginal discharge.  Abnormal uterine bleeding.   Unusual pain shortly after finishing your period. DIAGNOSIS  Your caregiver will choose some of the following methods to make a diagnosis, such as:   Performinga physical exam and history. A pelvic exam typically reveals a very tender uterus and surrounding pelvis.   Ordering laboratory tests including a pregnancy test, blood tests, and urine test.  Orderingcultures of the vagina and cervix to check for a sexually transmitted infection (STI).  Performing an ultrasound.   Performing a laparoscopic procedure to look inside the pelvis.  TREATMENT   Antibiotic medicines may be prescribed and taken by mouth.   Sexual partners may be treated when the infection is caused by a sexually transmitted disease (STD).   Hospitalization may be needed to give antibiotics intravenously.  Surgery may be needed, but this is rare. It may take weeks until you are completely well. If you are diagnosed with PID, you should also be checked for human immunodeficiency virus (HIV). HOME CARE INSTRUCTIONS   If  given, take your antibiotics as directed. Finish the medicine even if you start to feel better.   Only take over-the-counter or prescription medicines for pain, discomfort, or fever as directed by your caregiver.   Do not have sexual intercourse until treatment is completed or as directed by your caregiver. If PID is confirmed, your recent sexual partner(s) will need treatment.   Keep your follow-up appointments. SEEK MEDICAL CARE IF:   You have increased or abnormal vaginal discharge.   You need prescription medicine for your pain.   You vomit.   You cannot take your medicines.   Your partner has an STD.  SEEK IMMEDIATE MEDICAL CARE IF:   You have a fever.   You have increased abdominal or pelvic pain.   You have chills.   You have pain when you urinate.   You are not better after 72 hours following treatment.  MAKE SURE YOU:   Understand these instructions.  Will watch your condition.  Will get help right away if you are not doing well or get worse. Document Released: 04/29/2005 Document Revised: 08/24/2012 Document Reviewed: 04/25/2011 [facility]ExitCare Patient Information 2014 [place]ExitCare, [place]LL[place].

## 2013-09-24 NOTE — ED Notes (Signed)
Pt c/o abd pain and nausea x 2 days. Denies bowel/bladder changes

## 2013-09-25 LAB — HIV ANTIBODY (ROUTINE TESTING W REFLEX): HIV 1&2 Ab, 4th Generation: NONREACTIVE

## 2013-09-25 LAB — GC/CHLAMYDIA PROBE AMP
CT PROBE, AMP APTIMA: NEGATIVE
GC PROBE AMP APTIMA: NEGATIVE

## 2013-09-25 NOTE — ED Provider Notes (Signed)
Medical screening examination/treatment/procedure(s) were performed by non-physician practitioner and as supervising physician I was immediately available for consultation/collaboration.  Dominika Losey L Sahith Nurse, MD 09/25/13 0126 

## 2013-09-27 LAB — URINE CULTURE

## 2013-12-07 ENCOUNTER — Ambulatory Visit: Payer: Self-pay

## 2013-12-15 ENCOUNTER — Encounter (HOSPITAL_COMMUNITY): Payer: Self-pay | Admitting: Emergency Medicine

## 2013-12-15 ENCOUNTER — Emergency Department (HOSPITAL_COMMUNITY)
Admission: EM | Admit: 2013-12-15 | Discharge: 2013-12-15 | Disposition: A | Discharge status: Home / Self Care | Payer: Self-pay | Attending: Emergency Medicine | Admitting: Emergency Medicine

## 2013-12-15 DIAGNOSIS — K029 Dental caries, unspecified: Secondary | ICD-10-CM | POA: Insufficient documentation

## 2013-12-15 DIAGNOSIS — Z79899 Other long term (current) drug therapy: Secondary | ICD-10-CM | POA: Insufficient documentation

## 2013-12-15 DIAGNOSIS — K089 Disorder of teeth and supporting structures, unspecified: Secondary | ICD-10-CM | POA: Insufficient documentation

## 2013-12-15 DIAGNOSIS — F17200 Nicotine dependence, unspecified, uncomplicated: Secondary | ICD-10-CM | POA: Insufficient documentation

## 2013-12-15 DIAGNOSIS — Z792 Long term (current) use of antibiotics: Secondary | ICD-10-CM | POA: Insufficient documentation

## 2013-12-15 DIAGNOSIS — I10 Essential (primary) hypertension: Secondary | ICD-10-CM | POA: Insufficient documentation

## 2013-12-15 MED ORDER — TRAMADOL HCL 50 MG PO TABS: 50.0000 mg | ORAL_TABLET | Freq: Four times a day (QID) | ORAL | Status: DC | PRN

## 2013-12-15 NOTE — Discharge Instructions (Signed)
Dental Caries  Dental caries (also called tooth decay) is the most common oral disease. It can occur at any age but is more common in children and young adults.   HOW DENTAL CARIES DEVELOPS   The process of decay begins when bacteria and foods (particularly sugars and starches) combine in your mouth to produce plaque. Plaque is a substance that sticks to the hard, outer surface of a tooth (enamel). The bacteria in plaque produce acids that attack enamel. These acids may also attack the root surface of a tooth (cementum) if it is exposed. Repeated attacks dissolve these surfaces and create holes in the tooth (cavities). If left untreated, the acids destroy the other layers of the tooth.   RISK FACTORS   Frequent sipping of sugary beverages.    Frequent snacking on sugary and starchy foods, especially those that easily get stuck in the teeth.    Poor oral hygiene.    Dry mouth.    Substance abuse such as methamphetamine abuse.    Broken or poor-fitting dental restorations.    Eating disorders.    Gastroesophageal reflux disease (GERD).    Certain radiation treatments to the head and neck.  SYMPTOMS  In the early stages of dental caries, symptoms are seldom present. Sometimes white, chalky areas may be seen on the enamel or other tooth layers. In later stages, symptoms may include:   Pits and holes on the enamel.   Toothache after sweet, hot, or cold foods or drinks are consumed.   Pain around the tooth.   Swelling around the tooth.  DIAGNOSIS   Most of the time, dental caries is detected during a regular dental checkup. A diagnosis is made after a thorough medical and dental history is taken and the surfaces of your teeth are checked for signs of dental caries. Sometimes special instruments, such as lasers, are used to check for dental caries. Dental X-ray exams may be taken so that areas not visible to the eye (such as between the contact areas of the teeth) can be checked for cavities.    TREATMENT   If dental caries is in its early stages, it may be reversed with a fluoride treatment or an application of a remineralizing agent at the dental office. Thorough brushing and flossing at home is needed to aid these treatments. If it is in its later stages, treatment depends on the location and extent of tooth destruction:    If a small area of the tooth has been destroyed, the destroyed area will be removed and cavities will be filled with a material such as gold, silver amalgam, or composite resin.    If a large area of the tooth has been destroyed, the destroyed area will be removed and a cap (crown) will be fitted over the remaining tooth structure.    If the center part of the tooth (pulp) is affected, a procedure called a root canal will be needed before a filling or crown can be placed.    If most of the tooth has been destroyed, the tooth may need to be pulled (extracted).  HOME CARE INSTRUCTIONS  You can prevent, stop, or reverse dental caries at home by practicing good oral hygiene. Good oral hygiene includes:   Thoroughly cleaning your teeth at least twice a day with a toothbrush and dental floss.    Using a fluoride toothpaste. A fluoride mouth rinse may also be used if recommended by your dentist or health care provider.    Restricting   the amount of sugary and starchy foods and sugary liquids you consume.    Avoiding frequent snacking on these foods and sipping of these liquids.    Keeping regular visits with a dentist for checkups and cleanings.  PREVENTION    Practice good oral hygiene.   Consider a dental sealant. A dental sealant is a coating material that is applied by your dentist to the pits and grooves of teeth. The sealant prevents food from being trapped in them. It may protect the teeth for several years.   Ask about fluoride supplements if you live in a community without fluorinated water or with water that has a low fluoride content. Use fluoride supplements  as directed by your dentist or health care provider.   Allow fluoride varnish applications to teeth if directed by your dentist or health care provider.  Document Released: 01/19/2002 Document Revised: 09/13/2013 Document Reviewed: 05/01/2012  ExitCare Patient Information 2015 [employer]. This information is not intended to replace advice given to you by your health care provider. Make sure you discuss any questions you have with your health care provider.

## 2013-12-15 NOTE — ED Provider Notes (Signed)
CSN: [id number]     Arrival date & time 12/15/13  1615 History   None    Chief Complaint  Patient presents with  . Dental Pain   (Consider location/radiation/quality/duration/timing/severity/associated sxs/prior Treatment) HPI 23 year old female with PMH of HTN presents to the ED with complaints of dental pain.  Patient has been seen for this previously in April. At that time she was given Penicillin and instructed to follow up with a local dentist.  She reports that she has had increasing L jaw/dental pain for 1 week.  She states that she has a "hole" in her tooth.  She has yet to see a dentist secondary to insurance issues.    Pain is severe and worse with eating/chewing.  No recent fever, chills. No difficulty eating/drinking.  No associated SOB.  She does note associated headache.   Past Medical History  Diagnosis Date  . Hypertension    History reviewed. No pertinent past surgical history. Family History  Problem Relation Age of Onset  . Hypertension Maternal Grandmother   . Hypertension Maternal Grandfather   . Hypertension Paternal Grandmother   . Diabetes Paternal Grandmother    History  Substance Use Topics  . Smoking status: Current Every Day Smoker -- 0.20 packs/day    Types: Cigarettes  . Smokeless tobacco: Not on file  . Alcohol Use: Yes     Comment: less than monthly   OB History   Grav Para Term Preterm Abortions TAB SAB Ect Mult Living                 Review of Systems  Constitutional: Negative for fever and chills.  HENT: Negative for trouble swallowing.   Respiratory: Negative for shortness of breath.     Allergies  Review of patient's allergies indicates no known allergies.  Home Medications   Prior to Admission medications   Medication Sig Start Date End Date Taking? Authorizing Provider  doxycycline (VIBRAMYCIN) 100 MG capsule Take 1 capsule (100 mg total) by mouth 2 (two) times daily. 09/24/13   Hannah Muthersbaugh, PA-C  hydrochlorothiazide  (HYDRODIURIL) 25 MG tablet Take 12.5 mg by mouth daily.    Historical Provider, MD   BP 124/83  Pulse 102  Temp(Src) 99.3 F (37.4 C) (Oral)  Resp 16  Ht 5\' 5"  (1.651 m)  Wt 235 lb (106.595 kg)  BMI 39.11 kg/m2  SpO2 100% Physical Exam  Constitutional: She appears well-developed and well-nourished. No distress.  HENT:  Head: Normocephalic and atraumatic.  Mouth/Throat:    Oropharynx clear.  Left 3rd molar with large dental cary.  No surrounding erythema or abscess.   Neck: Neck supple.  Cardiovascular: Normal rate and regular rhythm.   Pulmonary/Chest: Effort normal and breath sounds normal.  Lymphadenopathy:    She has no cervical adenopathy.    ED Course  Procedures (including critical care time) Labs Review Labs Reviewed - No data to display  Imaging Review No results found.   EKG Interpretation None     MDM   Final diagnoses:  Pain due to dental caries   23 year old female presents with dental pain. - Left 3rd molar - Large dental cary noted.  - No signs of abscess/underlying infection. - Will treat with pain medication and continued use of oragel. - Patient needs to follow up closely with PCP.  Local dentist info given.     Tommie SamsJayce G Traylen Eckels, DO 12/15/13 1652

## 2013-12-15 NOTE — ED Notes (Signed)
Pt from home for eval of left sided dental pain since Thursday, pt states that she has been seen for same and was given penicillin prescription. Pt finished antibiotic but states pain has come back and now causing HA. Denies any vision changes, denies any numbness or tingling to extremities or fevers/sob/cp. Nad noted.

## 2013-12-18 NOTE — ED Provider Notes (Signed)
Medical screening examination/treatment/procedure(s) were conducted as a shared visit with non-physician practitioner(s) and myself.  I personally evaluated the patient during the encounter. 22yo F, c/o left lower dental pain x1 week. Has been see previously for same and not f/u with dentist. VSS, NAD, resps easy, left lower molar with dental decay. No gingival erythema, edema, fluctuance. No intra-oral edema. Tx symptomatically, f/u dentist.       Samuel JesterKathleen Ryken Paschal, DO 12/18/13 1237

## 2013-12-21 ENCOUNTER — Ambulatory Visit: Payer: Self-pay

## 2014-01-18 ENCOUNTER — Emergency Department (HOSPITAL_COMMUNITY)
Admission: EM | Admit: 2014-01-18 | Discharge: 2014-01-18 | Disposition: A | Discharge status: Home / Self Care | Payer: Self-pay | Attending: Emergency Medicine | Admitting: Emergency Medicine

## 2014-01-18 ENCOUNTER — Encounter (HOSPITAL_COMMUNITY): Payer: Self-pay | Admitting: Emergency Medicine

## 2014-01-18 DIAGNOSIS — J069 Acute upper respiratory infection, unspecified: Secondary | ICD-10-CM | POA: Insufficient documentation

## 2014-01-18 DIAGNOSIS — I10 Essential (primary) hypertension: Secondary | ICD-10-CM | POA: Insufficient documentation

## 2014-01-18 DIAGNOSIS — F17200 Nicotine dependence, unspecified, uncomplicated: Secondary | ICD-10-CM | POA: Insufficient documentation

## 2014-01-18 DIAGNOSIS — J029 Acute pharyngitis, unspecified: Secondary | ICD-10-CM | POA: Insufficient documentation

## 2014-01-18 LAB — RAPID STREP SCREEN (MED CTR MEBANE ONLY): Streptococcus, Group A Screen (Direct): NEGATIVE

## 2014-01-18 MED ORDER — DEXTROMETHORPHAN POLISTIREX 30 MG/5ML PO LQCR: 30.0000 mg | ORAL | Status: DC | PRN

## 2014-01-18 MED ORDER — LORATADINE-PSEUDOEPHEDRINE ER 5-120 MG PO TB12: 1.0000 | ORAL_TABLET | Freq: Two times a day (BID) | ORAL | Status: DC

## 2014-01-18 NOTE — ED Provider Notes (Signed)
CSN: [id number]     Arrival date & time 01/18/14  1153 History   This chart was scribed for non-physician practitioner Emilia Beck, PA-C, working with Flint Melter, MD, by Yevette Edwards, ED Scribe. This patient was seen in room TR09C/TR09C and the patient's care was started at 1:18 PM.  First MD Initiated Contact with Patient 01/18/14 1230     Chief Complaint  Patient presents with  . Sore Throat    The history is provided by the patient. No language interpreter was used.   HPI Comments: Sara Taylor is a 23 y.o. female who presents to the Emergency Department complaining of a sore throat, onset three days ago, which is increased with swallowing. [name] also endorses a mild cough. She denies a fever, sinus congestion, otalgia, or facial swelling. She also denies known sick contacts. She has treated the symptoms with OTC cold and cough without resolution. [name] is a current smoker.    Past Medical History  Diagnosis Date  . Hypertension    No past surgical history on file. Family History  Problem Relation Age of Onset  . Hypertension Maternal Grandmother   . Hypertension Maternal Grandfather   . Hypertension Paternal Grandmother   . Diabetes Paternal Grandmother    History  Substance Use Topics  . Smoking status: Current Every Day Smoker -- 0.20 packs/day    Types: Cigarettes  . Smokeless tobacco: Not on file  . Alcohol Use: Yes     Comment: less than monthly   No OB history provided.  Review of Systems  HENT: Positive for sore throat.   All other systems reviewed and are negative.   Allergies  Doxycycline  Home Medications   Prior to Admission medications   Medication Sig Start Date End Date Taking? Authorizing Provider  albuterol (PROVENTIL HFA;VENTOLIN HFA) 108 (90 BASE) MCG/ACT inhaler Inhale 2 puffs into the lungs every 4 (four) hours as needed for wheezing or shortness of breath.    Historical Provider, MD  traMADol (ULTRAM) 50 MG tablet  Take 1-2 tablets (50-100 mg total) by mouth every 6 (six) hours as needed. 12/15/13   Tommie Sams, DO   Triage Vitals: BP 151/73  Pulse 99  Temp(Src) 98 F (36.7 C) (Oral)  Resp 18  Ht  (1.651 m)  Wt 335 lb (151.955 kg)  BMI 55.75 kg/m2  SpO2 99%  LMP 07/18/2013  Physical Exam  Nursing note and vitals reviewed. Constitutional: She is oriented to person, place, and time. She appears well-developed and well-nourished. No distress.  HENT:  Head: Normocephalic and atraumatic.  Eyes: Conjunctivae and EOM are normal.  Neck: Neck supple. No tracheal deviation present.  Cardiovascular: Normal rate.   Pulmonary/Chest: Effort normal. No respiratory distress.  Musculoskeletal: Normal range of motion.  Neurological: She is alert and oriented to person, place, and time.  Skin: Skin is warm and dry.  Psychiatric: She has a normal mood and affect. Her behavior is normal.    ED Course  Procedures (including critical care time)  DIAGNOSTIC STUDIES: Oxygen Saturation is 99% on room air, normal by my interpretation.    COORDINATION OF CARE:  1:20 PM- Discussed treatment plan with patient, and the patient agreed to the plan.   Labs Review Labs Reviewed  RAPID STREP SCREEN    Imaging Review No results found.   EKG Interpretation None      MDM   Final diagnoses:  URI (upper respiratory infection)    Patient's rapid strep  negative. Patient likely has URI. Patient will be treated with delsym and claritin. Vitals stable and patient aferbile. Patient instructed to return with worsening or concerning symptoms.   I personally performed the services described in this documentation, which was scribed in my presence. The recorded information has been reviewed and is accurate.   Emilia Beck, PA-C 01/19/14 2217

## 2014-01-18 NOTE — ED Notes (Signed)
Pt reports sore throat x 3 days. Denies any SOB, swelling to tongue or throat. NAD.

## 2014-01-18 NOTE — Discharge Instructions (Signed)
Take Claritin as directed for congestion. Take delsym as needed for cough. Refer to attached documents for more information.

## 2014-01-20 ENCOUNTER — Ambulatory Visit: Payer: Self-pay

## 2014-01-20 LAB — CULTURE, GROUP A STREP

## 2014-01-20 NOTE — ED Provider Notes (Signed)
Medical screening examination/treatment/procedure(s) were performed by non-physician practitioner and as supervising physician I was immediately available for consultation/collaboration.   EKG Interpretation None       Flint Melter, MD 01/20/14 1416

## 2014-01-26 ENCOUNTER — Ambulatory Visit: Payer: Self-pay

## 2014-02-22 ENCOUNTER — Telehealth: Payer: Self-pay | Admitting: Family Medicine

## 2014-02-22 DIAGNOSIS — K029 Dental caries, unspecified: Secondary | ICD-10-CM

## 2014-02-22 NOTE — Telephone Encounter (Signed)
Pt called [facility] She has the orange card. Please call when referral placed. jw

## 2014-02-25 ENCOUNTER — Other Ambulatory Visit: Payer: Self-pay

## 2014-03-02 ENCOUNTER — Telehealth: Payer: Self-pay

## 2014-03-02 ENCOUNTER — Other Ambulatory Visit (HOSPITAL_COMMUNITY)
Admission: RE | Admit: 2014-03-02 | Discharge: 2014-03-02 | Disposition: A | Discharge status: Home / Self Care | Payer: Self-pay | Source: Ambulatory Visit | Attending: Family Medicine | Admitting: Family Medicine

## 2014-03-02 ENCOUNTER — Encounter: Payer: Self-pay | Admitting: Family Medicine

## 2014-03-02 ENCOUNTER — Ambulatory Visit (INDEPENDENT_AMBULATORY_CARE_PROVIDER_SITE_OTHER): Payer: Self-pay | Admitting: Family Medicine

## 2014-03-02 VITALS — BP 136/89 | HR 103 | Temp 98.1°F | Ht 66.0 in | Wt 333.0 lb

## 2014-03-02 DIAGNOSIS — N898 Other specified noninflammatory disorders of vagina: Secondary | ICD-10-CM

## 2014-03-02 DIAGNOSIS — Z113 Encounter for screening for infections with a predominantly sexual mode of transmission: Secondary | ICD-10-CM | POA: Insufficient documentation

## 2014-03-02 LAB — POCT WET PREP (WET MOUNT)
Clue Cells Wet Prep Whiff POC: NEGATIVE
WBC, Wet Prep HPF POC: >20

## 2014-03-02 MED ORDER — HYDROCHLOROTHIAZIDE 25 MG PO TABS: 25.0000 mg | ORAL_TABLET | Freq: Every day | ORAL | Status: DC

## 2014-03-02 MED ORDER — METRONIDAZOLE 500 MG PO TABS: 500.0000 mg | ORAL_TABLET | Freq: Two times a day (BID) | ORAL | Status: DC

## 2014-03-02 NOTE — Assessment & Plan Note (Signed)
A few clue cells seen on wet prep today. Will treat with Flagyl. GC and Chlamydia testing performed today.  Will call patient with results once they're available.

## 2014-03-02 NOTE — Telephone Encounter (Signed)
Called and informed patient of lesion on cervix per Dr. Adriana Simasook and that she will need to follow up with PCP for a PAP smear and reevaluation. Patient will make appointment at later date. Glennie HawkSimpson, Michelle R

## 2014-03-02 NOTE — Progress Notes (Signed)
   Subjective:    Patient ID: Sara LandsbergJasmine L Taylor, female    DOB: [birth year], 23 y.o.   MRN: [id number]  HPI 23 year old female presents for same day appointment with complaints of vaginal discharge.  1) Vaginal discharge  Onset: 4 days ago    Description: Yellow in color.   Odor: No  Itching: No  Patient denies dysuria, fever, chills. She does note some pelvic discomfort.  She is sexual active and desires STD testing today.  Review of Systems Per HPI    Objective:   Physical Exam Filed Vitals:   03/02/14 0952  BP: 136/89  Pulse: 103  Temp: 98.1 F (36.7 C)   Exam: General: well appearing, NAD Abdomen: morbidly obese, soft, nontender, nondistended. No rebound or guarding.  Pelvic Exam:        External: normal female genitalia without lesions or masses        Vagina: normal without lesions or masses        Cervix: Lesion noted at 10 o'clock position.         Samples for Wet prep, GC/Chlamydia obtained.    Assessment & Plan:  See Problem List

## 2014-03-03 LAB — CERVICOVAGINAL ANCILLARY ONLY
Chlamydia: NEGATIVE
NEISSERIA GONORRHEA: NEGATIVE

## 2014-03-04 ENCOUNTER — Encounter (HOSPITAL_COMMUNITY): Payer: Self-pay | Admitting: Emergency Medicine

## 2014-03-04 ENCOUNTER — Telehealth: Payer: Self-pay | Admitting: *Deleted

## 2014-03-04 DIAGNOSIS — R197 Diarrhea, unspecified: Secondary | ICD-10-CM | POA: Insufficient documentation

## 2014-03-04 DIAGNOSIS — R110 Nausea: Secondary | ICD-10-CM | POA: Insufficient documentation

## 2014-03-04 DIAGNOSIS — Z792 Long term (current) use of antibiotics: Secondary | ICD-10-CM | POA: Insufficient documentation

## 2014-03-04 DIAGNOSIS — J028 Acute pharyngitis due to other specified organisms: Secondary | ICD-10-CM | POA: Insufficient documentation

## 2014-03-04 DIAGNOSIS — M2550 Pain in unspecified joint: Secondary | ICD-10-CM | POA: Insufficient documentation

## 2014-03-04 DIAGNOSIS — I10 Essential (primary) hypertension: Secondary | ICD-10-CM | POA: Insufficient documentation

## 2014-03-04 DIAGNOSIS — B9789 Other viral agents as the cause of diseases classified elsewhere: Secondary | ICD-10-CM | POA: Insufficient documentation

## 2014-03-04 DIAGNOSIS — Z79899 Other long term (current) drug therapy: Secondary | ICD-10-CM | POA: Insufficient documentation

## 2014-03-04 DIAGNOSIS — Z720 Tobacco use: Secondary | ICD-10-CM | POA: Insufficient documentation

## 2014-03-04 NOTE — Telephone Encounter (Signed)
Attempted to call got message that they are unavailable at this time, will try later

## 2014-03-04 NOTE — ED Notes (Signed)
Presents with chills, body aches, sore throat and diarrhea  And nausea began yesterday. Unsure of temp at home. Pt is alert, oriented. Denies sick contacts. Throat red.

## 2014-03-04 NOTE — Telephone Encounter (Signed)
Message copied by Farrell OursEVANS, Tinsley Everman K on Fri Mar 04, 2014  4:06 PM ------      Message from: Sara SamsOOK, JAYCE G      Created: Thu Mar 03, 2014  4:50 PM       Please inform patient of negative GC and Chlamydia.            Thanks            Jayce ------

## 2014-03-05 ENCOUNTER — Emergency Department (HOSPITAL_COMMUNITY)
Admission: EM | Admit: 2014-03-05 | Discharge: 2014-03-05 | Disposition: A | Discharge status: Home / Self Care | Payer: Self-pay | Attending: Emergency Medicine | Admitting: Emergency Medicine

## 2014-03-05 DIAGNOSIS — J028 Acute pharyngitis due to other specified organisms: Secondary | ICD-10-CM

## 2014-03-05 DIAGNOSIS — R509 Fever, unspecified: Secondary | ICD-10-CM

## 2014-03-05 LAB — RAPID STREP SCREEN (MED CTR MEBANE ONLY): STREPTOCOCCUS, GROUP A SCREEN (DIRECT): NEGATIVE

## 2014-03-05 MED ORDER — IBUPROFEN 200 MG PO TABS
600.0000 mg | ORAL_TABLET | Freq: Once | ORAL | Status: AC
  Administered 2014-03-05: 600 mg via ORAL
  Filled 2014-03-05: qty 3

## 2014-03-05 MED ORDER — ACETAMINOPHEN 325 MG PO TABS
650.0000 mg | ORAL_TABLET | Freq: Once | ORAL | Status: AC
  Administered 2014-03-05: 650 mg via ORAL
  Filled 2014-03-05: qty 2

## 2014-03-05 NOTE — ED Provider Notes (Signed)
CSN: [id number]     Arrival date & time 03/04/14  2349 History   First MD Initiated Contact with Patient 03/05/14 [phone]     Chief Complaint  Patient presents with  . Fever     (Consider location/radiation/quality/duration/timing/severity/associated sxs/prior Treatment) HPI Comments: 23 year old female with tobacco abuse, high blood pressure presents with IVH, fever, chills, sore throat diarrhea since yesterday. No sick contacts recent travel. No significant infectious disease history. Symptoms fairly constant. Patient tolerating by mouth.  Patient is a 23 y.o. female presenting with fever. The history is provided by the patient.  Fever Associated symptoms: chills, congestion, diarrhea, nausea and sore throat   Associated symptoms: no chest pain, no dysuria, no headaches, no rash and no vomiting     Past Medical History  Diagnosis Date  . Hypertension    History reviewed. No pertinent past surgical history. Family History  Problem Relation Age of Onset  . Hypertension Maternal Grandmother   . Hypertension Maternal Grandfather   . Hypertension Paternal Grandmother   . Diabetes Paternal Grandmother    History  Substance Use Topics  . Smoking status: Current Every Day Smoker -- 0.20 packs/day    Types: Cigarettes  . Smokeless tobacco: Not on file  . Alcohol Use: Yes     Comment: less than monthly   OB History   Grav Para Term Preterm Abortions TAB SAB Ect Mult Living                 Review of Systems  Constitutional: Positive for fever, chills and appetite change.  HENT: Positive for congestion and sore throat.   Eyes: Negative for visual disturbance.  Respiratory: Negative for shortness of breath.   Cardiovascular: Negative for chest pain.  Gastrointestinal: Positive for nausea and diarrhea. Negative for vomiting and abdominal pain.  Genitourinary: Negative for dysuria and flank pain.  Musculoskeletal: Positive for arthralgias. Negative for back pain, neck pain and  neck stiffness.  Skin: Negative for rash.  Neurological: Negative for light-headedness and headaches.      Allergies  Doxycycline  Home Medications   Prior to Admission medications   Medication Sig Start Date End Date Taking? Authorizing Provider  albuterol (PROVENTIL HFA;VENTOLIN HFA) 108 (90 BASE) MCG/ACT inhaler Inhale 2 puffs into the lungs every 4 (four) hours as needed for wheezing or shortness of breath.   Yes Historical Provider, MD  hydrochlorothiazide (HYDRODIURIL) 25 MG tablet Take 1 tablet (25 mg total) by mouth daily. 03/02/14  Yes Jayce G Cook, DO  OVER THE COUNTER MEDICATION Take 1 tablet by mouth every 6 (six) hours as needed (for cough). Cold and allergy medication   Yes Historical Provider, MD  metroNIDAZOLE (FLAGYL) 500 MG tablet Take 1 tablet (500 mg total) by mouth 2 (two) times daily. 03/02/14   Jayce G Cook, DO   BP 137/51  Pulse 103  Temp(Src) 100.9 F (38.3 C) (Oral)  Resp 17  Ht 5\' 5"  (1.651 m)  Wt 327 lb 14.4 oz (148.734 kg)  BMI 54.57 kg/m2  SpO2 100% Physical Exam  Nursing note and vitals reviewed. Constitutional: She is oriented to person, place, and time. She appears well-developed and well-nourished.  HENT:  Head: Normocephalic and atraumatic.  Mild posterior erythema, mild dry mucous membranes  No trismus, uvular deviation, unilateral posterior pharyngeal edema or submandibular swelling. Neck supple no meningismus  Eyes: Conjunctivae are normal. Right eye exhibits no discharge. Left eye exhibits no discharge.  Neck: Normal range of motion. Neck supple. No tracheal deviation present.  Cardiovascular: Normal rate and regular rhythm.   Pulmonary/Chest: Effort normal and breath sounds normal.  Abdominal: Soft. She exhibits no distension. There is no tenderness. There is no guarding.  Musculoskeletal: She exhibits no edema.  Neurological: She is alert and oriented to person, place, and time.  Skin: Skin is warm. No rash noted.  Psychiatric: She  has a normal mood and affect.    ED Course  Procedures (including critical care time) Labs Review Labs Reviewed  RAPID STREP SCREEN  CULTURE, GROUP A STREP    Imaging Review No results found.   EKG Interpretation None      MDM   Final diagnoses:  Acute pharyngitis due to other specified organisms  Fever and chills   Well-appearing overall healthy female presents with clinically viral syndrome. Strep test negative, no signs of emergent ENT infection. Oral fluids, antipyretics and pain meds given in the ER. No signs of meningitis. Discuss outpatient followup.  Results and differential diagnosis were discussed with the patient/parent/guardian. Close follow up outpatient was discussed, comfortable with the plan.   Medications  ibuprofen (ADVIL,MOTRIN) tablet 600 mg (600 mg Oral Given 03/05/14 0158)  acetaminophen (TYLENOL) tablet 650 mg (650 mg Oral Given 03/05/14 0158)    Filed Vitals:   03/04/14 2355 03/05/14 0157  BP: 147/91 137/51  Pulse: 110 103  Temp: 99.9 F (37.7 C) 100.9 F (38.3 C)  TempSrc: Oral Oral  Resp: 16 17  Height: 5\' 5"  (1.651 m)   Weight: 327 lb 14.4 oz (148.734 kg)   SpO2: 99% 100%    Final diagnoses:  Acute pharyngitis due to other specified organisms  Fever and chills       Enid SkeensJoshua M Brea Coleson, MD 03/05/14 0221

## 2014-03-05 NOTE — ED Notes (Signed)
Preparing to discharge

## 2014-03-05 NOTE — Discharge Instructions (Signed)
If you were given medicines take as directed.  If you are on coumadin or contraceptives realize their levels and effectiveness is altered by many different medicines.  If you have any reaction (rash, tongues swelling, other) to the medicines stop taking and see a physician.   Please follow up as directed and return to the ER or see a physician for new or worsening symptoms.  Thank you. Filed Vitals:   03/04/14 2355 03/05/14 0157  BP: 147/91 137/51  Pulse: 110 103  Temp: 99.9 F (37.7 C) 100.9 F (38.3 C)  TempSrc: Oral Oral  Resp: 16 17  Height: 5\' 5"  (1.651 m)   Weight: 327 lb 14.4 oz (148.734 kg)   SpO2: 99% 100%

## 2014-03-06 LAB — CULTURE, GROUP A STREP

## 2014-03-07 NOTE — Telephone Encounter (Signed)
Person unavailable at this time message

## 2014-03-12 ENCOUNTER — Emergency Department (HOSPITAL_COMMUNITY)
Admission: EM | Admit: 2014-03-12 | Discharge: 2014-03-12 | Disposition: A | Discharge status: Home / Self Care | Payer: Self-pay | Attending: Emergency Medicine | Admitting: Emergency Medicine

## 2014-03-12 ENCOUNTER — Encounter (HOSPITAL_COMMUNITY): Payer: Self-pay | Admitting: Emergency Medicine

## 2014-03-12 DIAGNOSIS — Z720 Tobacco use: Secondary | ICD-10-CM | POA: Insufficient documentation

## 2014-03-12 DIAGNOSIS — K0889 Other specified disorders of teeth and supporting structures: Secondary | ICD-10-CM

## 2014-03-12 DIAGNOSIS — I10 Essential (primary) hypertension: Secondary | ICD-10-CM | POA: Insufficient documentation

## 2014-03-12 DIAGNOSIS — K029 Dental caries, unspecified: Secondary | ICD-10-CM | POA: Insufficient documentation

## 2014-03-12 DIAGNOSIS — K088 Other specified disorders of teeth and supporting structures: Secondary | ICD-10-CM | POA: Insufficient documentation

## 2014-03-12 DIAGNOSIS — Z79899 Other long term (current) drug therapy: Secondary | ICD-10-CM | POA: Insufficient documentation

## 2014-03-12 MED ORDER — TRAMADOL-ACETAMINOPHEN 37.5-325 MG PO TABS: 1.0000 | ORAL_TABLET | Freq: Four times a day (QID) | ORAL | Status: DC | PRN

## 2014-03-12 NOTE — Discharge Instructions (Signed)
Take the prescribed medication as directed.  May take with tylenol if needed. Have left message with felicia-- should be contacting you in regards to a dental appt. Return to the ED for new or worsening symptoms.

## 2014-03-12 NOTE — ED Provider Notes (Signed)
Medical screening examination/treatment/procedure(s) were performed by non-physician practitioner and as supervising physician I was immediately available for consultation/collaboration.   EKG Interpretation None        Nahuel Wilbert M Joani Cosma, MD 03/12/14 1610 

## 2014-03-12 NOTE — ED Provider Notes (Signed)
CSN: [id number]     Arrival date & time 03/12/14  1403 History  This chart was scribed for non-physician practitioner, Sharilyn SitesLisa Sanders, PA-C working with Enid SkeensJoshua M Zavitz, MD, by Jarvis Morganaylor Ferguson, ED Scribe. This patient was seen in room TR06C/TR06C and the patient's care was started at 2:31 PM.    Chief Complaint  Patient presents with  . Dental Pain    The history is provided by the patient. No language interpreter was used.   HPI Comments: Sara Taylor is a 23 y.o. female with a h/o HTN who presents to the Emergency Department complaining of left lower dental pain the began this morning. She states she has had a hole in her tooth since March and it has been causing her mild discomfort but became worse this morning. Pt notes that it is due to a cavity. She has previously had a fever and sore throat but not at this time. She presented to the ED for those symptoms about 1 week ago. She had a fever of 100.9 at the time. Pt states she has been [facility] 2 [facility] and has made multiple attempts to call this office with no luck. She has even gotten a referral from her PCP and called them two weeks ago with no response. She denies any current fever, chills, nausea, vomiting, or diarrhea.    Past Medical History  Diagnosis Date  . Hypertension    History reviewed. No pertinent past surgical history. Family History  Problem Relation Age of Onset  . Hypertension Maternal Grandmother   . Hypertension Maternal Grandfather   . Hypertension Paternal Grandmother   . Diabetes Paternal Grandmother    History  Substance Use Topics  . Smoking status: Current Every Day Smoker -- 0.20 packs/day    Types: Cigarettes  . Smokeless tobacco: Not on file  . Alcohol Use: Yes     Comment: less than monthly   OB History   Grav Para Term Preterm Abortions TAB SAB Ect Mult Living                 Review of Systems  Constitutional: Negative for fever and chills.   HENT: Positive for dental problem (left lower). Negative for sore throat.   Gastrointestinal: Negative for nausea, vomiting and diarrhea.  All other systems reviewed and are negative.     Allergies  Doxycycline  Home Medications   Prior to Admission medications   Medication Sig Start Date End Date Taking? Authorizing Provider  albuterol (PROVENTIL HFA;VENTOLIN HFA) 108 (90 BASE) MCG/ACT inhaler Inhale 2 puffs into the lungs every 4 (four) hours as needed for wheezing or shortness of breath.    Historical Provider, MD  hydrochlorothiazide (HYDRODIURIL) 25 MG tablet Take 1 tablet (25 mg total) by mouth daily. 03/02/14   Tommie SamsJayce G Cook, DO  metroNIDAZOLE (FLAGYL) 500 MG tablet Take 1 tablet (500 mg total) by mouth 2 (two) times daily. 03/02/14   Tommie SamsJayce G Cook, DO  OVER THE COUNTER MEDICATION Take 1 tablet by mouth every 6 (six) hours as needed (for cough). Cold and allergy medication    Historical Provider, MD   Triage Vitals: BP 142/75  Pulse 68  Temp(Src) 98.4 F (36.9 C) (Oral)  Resp 20  SpO2 100%  Physical Exam  Nursing note and vitals reviewed. Constitutional: She is oriented to person, place, and time. She appears well-developed and well-nourished. No distress.  HENT:  Head: Normocephalic and atraumatic.  Mouth/Throat:  Uvula is midline, oropharynx is clear and moist and mucous membranes are normal. No oral lesions. No trismus in the jaw. Abnormal dentition. Dental caries present. No dental abscesses. No oropharyngeal exudate, posterior oropharyngeal edema, posterior oropharyngeal erythema or tonsillar abscesses.  Teeth largely in fair dentition, left lower molar with cavity present; surrounding gingiva normal in appearance without signs of abscess, handling secretions appropriately, no trismus  Eyes: Conjunctivae and EOM are normal. Pupils are equal, round, and reactive to light.  Neck: Normal range of motion. Neck supple.  Cardiovascular: Normal rate, regular rhythm and normal  heart sounds.   Pulmonary/Chest: Effort normal and breath sounds normal. No respiratory distress. She has no wheezes.  Musculoskeletal: Normal range of motion.  Neurological: She is alert and oriented to person, place, and time.  Skin: Skin is warm and dry. She is not diaphoretic.  Psychiatric: She has a normal mood and affect.     ED Course  Procedures (including critical care time)  DIAGNOSTIC STUDIES: Oxygen Saturation is 100% on RA, normal by my interpretation.    COORDINATION OF CARE:    Labs Review Labs Reviewed - No data to display  Imaging Review No results found.   EKG Interpretation None      MDM   Final diagnoses:  Pain, dental   Dental pain without dental abscess.  Patient having difficulty getting dental follow-up with orange card-- have spoken to wanda with care management and [facility] to address in the morning.  Rx tramadol for pain control.  Discussed plan with patient, he/she acknowledged understanding and agreed with plan of care.  Return precautions given for new or worsening symptoms.  I personally performed the services described in this documentation, which was scribed in my presence. The recorded information has been reviewed and is accurate.  Garlon HatchetLisa M Sanders, PA-C 03/12/14 [phone]

## 2014-03-12 NOTE — ED Notes (Signed)
Started this am with left lower dental pain.

## 2014-04-30 ENCOUNTER — Emergency Department (HOSPITAL_COMMUNITY)
Admission: EM | Admit: 2014-04-30 | Discharge: 2014-04-30 | Disposition: A | Discharge status: Home / Self Care | Payer: Self-pay | Attending: Emergency Medicine | Admitting: Emergency Medicine

## 2014-04-30 ENCOUNTER — Encounter (HOSPITAL_COMMUNITY): Payer: Self-pay | Admitting: *Deleted

## 2014-04-30 DIAGNOSIS — Z792 Long term (current) use of antibiotics: Secondary | ICD-10-CM | POA: Insufficient documentation

## 2014-04-30 DIAGNOSIS — Z720 Tobacco use: Secondary | ICD-10-CM | POA: Insufficient documentation

## 2014-04-30 DIAGNOSIS — M545 Low back pain, unspecified: Secondary | ICD-10-CM

## 2014-04-30 DIAGNOSIS — Z79899 Other long term (current) drug therapy: Secondary | ICD-10-CM | POA: Insufficient documentation

## 2014-04-30 DIAGNOSIS — M542 Cervicalgia: Secondary | ICD-10-CM | POA: Insufficient documentation

## 2014-04-30 DIAGNOSIS — I10 Essential (primary) hypertension: Secondary | ICD-10-CM | POA: Insufficient documentation

## 2014-04-30 MED ORDER — NAPROXEN 500 MG PO TABS: 500.0000 mg | ORAL_TABLET | Freq: Two times a day (BID) | ORAL | Status: DC

## 2014-04-30 MED ORDER — METHOCARBAMOL 500 MG PO TABS: 500.0000 mg | ORAL_TABLET | Freq: Two times a day (BID) | ORAL | Status: DC

## 2014-04-30 NOTE — ED Notes (Signed)
Declined W/C at D/C and was escorted to lobby by RN. 

## 2014-04-30 NOTE — Discharge Instructions (Signed)
Please follow up with your primary care physician in 1-2 days. If you do not have one please call the [facility] number listed above. Please take pain medication and/or muscle relaxants as prescribed and as needed for pain. Please do not drive on narcotic pain medication or on muscle relaxants. Please read all discharge instructions and return precautions.  ° °Back Pain, Adult °Low back pain is very common. About 1 in 5 people have back pain. The cause of low back pain is rarely dangerous. The pain often gets better over time. About half of people with a sudden onset of back pain feel better in just 2 weeks. About 8 in 10 people feel better by 6 weeks.  °CAUSES °Some common causes of back pain include: °· Strain of the muscles or ligaments supporting the spine. °· Wear and tear (degeneration) of the spinal discs. °· Arthritis. °· Direct injury to the back. °DIAGNOSIS °Most of the time, the direct cause of low back pain is not known. However, back pain can be treated effectively even when the exact cause of the pain is unknown. Answering your caregiver's questions about your overall health and symptoms is one of the most accurate ways to make sure the cause of your pain is not dangerous. If your caregiver needs more information, he or she may order lab work or imaging tests (X-rays or MRIs). However, even if imaging tests show changes in your back, this usually does not require surgery. °HOME CARE INSTRUCTIONS °For many people, back pain returns. Since low back pain is rarely dangerous, it is often a condition that people can learn to manage on their own.  °· Remain active. It is stressful on the back to sit or stand in one place. Do not sit, drive, or stand in one place for more than 30 minutes at a time. Take short walks on level surfaces as soon as pain allows. Try to increase the length of time you walk each day. °· Do not stay in bed. Resting more than 1 or 2 days can delay your  recovery. °· Do not avoid exercise or work. Your body is made to move. It is not dangerous to be active, even though your back may hurt. Your back will likely heal faster if you return to being active before your pain is gone. °· Pay attention to your body when you  bend and lift. Many people have less discomfort when lifting if they bend their knees, keep the load close to their bodies, and avoid twisting. Often, the most comfortable positions are those that put less stress on your recovering back. °· Find a comfortable position to sleep. Use a firm mattress and lie on your side with your knees slightly bent. If you lie on your back, put a pillow under your knees. °· Only take over-the-counter or prescription medicines as directed by your caregiver. Over-the-counter medicines to reduce pain and inflammation are often the most helpful. Your caregiver may prescribe muscle relaxant drugs. These medicines help dull your pain so you can more quickly return to your normal activities and healthy exercise. °· Put ice on the injured area. °¨ Put ice in a plastic bag. °¨ Place a towel between your skin and the bag. °¨ Leave the ice on for 15-20 minutes, 03-04 times a day for the first 2 to 3 days. After that, ice and heat may be alternated to reduce pain and spasms. °· Ask your caregiver about trying back exercises and gentle massage. This may be of some benefit. °· Avoid feeling anxious or stressed. Stress increases muscle tension and   can worsen back pain. It is important to recognize when you are anxious or stressed and learn ways to manage it. Exercise is a great option. °SEEK MEDICAL CARE IF: °· You have pain that is not relieved with rest or medicine. °· You have pain that does not improve in 1 week. °· You have new symptoms. °· You are generally not feeling well. °SEEK IMMEDIATE MEDICAL CARE IF:  °· You have pain that radiates from your back into your legs. °· You develop new bowel or bladder control problems. °· You  have unusual weakness or numbness in your arms or legs. °· You develop nausea or vomiting. °· You develop abdominal pain. °· You feel faint. °Document Released: 04/29/2005 Document Revised: 10/29/2011 Document Reviewed: 08/31/2013 °[employer]® Patient Information ©2015 [employer]. This information is not intended to replace advice given to you by your health care provider. Make sure you discuss any questions you have with your health care provider. ° °

## 2014-04-30 NOTE — ED Provider Notes (Signed)
CSN: [id number]     Arrival date & time 04/30/14  1623 History  This chart was scribed for non-physician practitioner working with Purvis SheffieldForrest Harrison, MD by Richarda Overlieichard Holland, ED Scribe. This patient was seen in room TR06C/TR06C and the patient's care was started at 4:40 PM.     Chief Complaint  Patient presents with  . Back Pain  . Neck Pain   The history is provided by the patient. No language interpreter was used.   HPI Comments: Sara LandsbergJasmine L Taylor is a 23 y.o. female who presents to the Emergency Department complaining moderate left lower sided back pain that started last night. Pt reports she was sitting at home when the pain began. She states the pain radiates up to the left side of the neck. When asked if her pain was sharp, dull or achy pt responded "all of them". She states her back pain does not radiate to her legs. She says she has had similar pain in the past but nothing this severe, and that her previous pain would resolve after ibuprofen . Pt reports no injuries, falls or heavy exertion recently. Pt denies cough, congestion, rash and any urinary symptoms.    Past Medical History  Diagnosis Date  . Hypertension    History reviewed. No pertinent past surgical history. Family History  Problem Relation Age of Onset  . Hypertension Maternal Grandmother   . Hypertension Maternal Grandfather   . Hypertension Paternal Grandmother   . Diabetes Paternal Grandmother    History  Substance Use Topics  . Smoking status: Current Every Day Smoker -- 0.20 packs/day    Types: Cigarettes  . Smokeless tobacco: Not on file  . Alcohol Use: Yes     Comment: less than monthly   OB History    No data available     Review of Systems  HENT: Negative for congestion.   Respiratory: Negative for cough.   Genitourinary: Negative for dysuria and hematuria.  Musculoskeletal: Positive for back pain and neck pain.  All other systems reviewed and are negative.     Allergies   Doxycycline  Home Medications   Prior to Admission medications   Medication Sig Start Date End Date Taking? Authorizing Provider  albuterol (PROVENTIL HFA;VENTOLIN HFA) 108 (90 BASE) MCG/ACT inhaler Inhale 2 puffs into the lungs every 4 (four) hours as needed for wheezing or shortness of breath.    Historical Provider, MD  hydrochlorothiazide (HYDRODIURIL) 25 MG tablet Take 1 tablet (25 mg total) by mouth daily. 03/02/14   Tommie SamsJayce G Cook, DO  methocarbamol (ROBAXIN) 500 MG tablet Take 1 tablet (500 mg total) by mouth 2 (two) times daily. 04/30/14   Ernesta Trabert L Ivory Bail, PA-C  metroNIDAZOLE (FLAGYL) 500 MG tablet Take 1 tablet (500 mg total) by mouth 2 (two) times daily. 03/02/14   Tommie SamsJayce G Cook, DO  naproxen (NAPROSYN) 500 MG tablet Take 1 tablet (500 mg total) by mouth 2 (two) times daily with a meal. 04/30/14   Nicola Quesnell L Tawonna Esquer, PA-C  OVER THE COUNTER MEDICATION Take 1 tablet by mouth every 6 (six) hours as needed (for cough). Cold and allergy medication    Historical Provider, MD  traMADol-acetaminophen (ULTRACET) 37.5-325 MG per tablet Take 1 tablet by mouth every 6 (six) hours as needed. 03/12/14   Garlon HatchetLisa M Sanders, PA-C   BP 127/89 mmHg  Pulse 90  Temp(Src) 98.4 F (36.9 C)  Resp 16  Ht 5\' 5"  (1.651 m)  SpO2 98% Physical Exam  Constitutional: She is oriented to  person, place, and time. She appears well-developed and well-nourished. No distress.  HENT:  Head: Normocephalic and atraumatic.  Right Ear: External ear normal.  Left Ear: External ear normal.  Nose: Nose normal.  Mouth/Throat: Oropharynx is clear and moist. No oropharyngeal exudate.  Eyes: Conjunctivae and EOM are normal. Pupils are equal, round, and reactive to light.  Neck: Normal range of motion. Neck supple.  Cardiovascular: Normal rate, regular rhythm, normal heart sounds and intact distal pulses.   Pulmonary/Chest: Effort normal and breath sounds normal. No respiratory distress.  Abdominal: Soft. There is no  tenderness.  Musculoskeletal:       Back:  No bony tenderness, no deformity.   Neurological: She is alert and oriented to person, place, and time. She has normal strength. No cranial nerve deficit. Gait normal. GCS eye subscore is 4. GCS verbal subscore is 5. GCS motor subscore is 6.  Sensation grossly intact.  No pronator drift.  Bilateral heel-knee-shin intact.  Skin: Skin is warm and dry. She is not diaphoretic.  Nursing note and vitals reviewed.   ED Course  Procedures   Medications - No data to display  DIAGNOSTIC STUDIES: Oxygen Saturation is 98% on RA, normal by my interpretation.    COORDINATION OF CARE: 4:47 PM Discussed treatment plan with pt at bedside and pt agreed to plan.   Labs Review Labs Reviewed - No data to display  Imaging Review No results found.   EKG Interpretation None      MDM   Final diagnoses:  Left-sided low back pain without sciatica   Filed Vitals:   04/30/14 1627  BP: 127/89  Pulse: 90  Temp: 98.4 F (36.9 C)  Resp: 16   Afebrile, NAD, non-toxic appearing, AAOx4.  Patient with back pain.  No neurological deficits and normal neuro exam.  Patient can walk but states is painful.  No loss of bowel or bladder control.  No concern for cauda equina.  No fever, night sweats, weight loss, h/o cancer, IVDU.  RICE protocol and pain medicine indicated and discussed with patient.  Patient is stable at time of discharge   I personally performed the services described in this documentation, which was scribed in my presence. The recorded information has been reviewed and is accurate.       Jeannetta EllisJennifer L Cannan Beeck, PA-C 04/30/14 1734  Purvis SheffieldForrest Harrison, MD 05/01/14 1204

## 2014-04-30 NOTE — ED Notes (Signed)
Pt reports left side lower back pains that started last night. Pain radiates up into her back and left side of her neck. Denies injury to back. Denies urinary symptoms.

## 2014-05-23 ENCOUNTER — Emergency Department (HOSPITAL_COMMUNITY)
Admission: EM | Admit: 2014-05-23 | Discharge: 2014-05-23 | Disposition: A | Discharge status: Home / Self Care | Payer: Self-pay | Attending: Emergency Medicine | Admitting: Emergency Medicine

## 2014-05-23 ENCOUNTER — Encounter (HOSPITAL_COMMUNITY): Payer: Self-pay | Admitting: Emergency Medicine

## 2014-05-23 DIAGNOSIS — Z720 Tobacco use: Secondary | ICD-10-CM | POA: Insufficient documentation

## 2014-05-23 DIAGNOSIS — I10 Essential (primary) hypertension: Secondary | ICD-10-CM | POA: Insufficient documentation

## 2014-05-23 DIAGNOSIS — J069 Acute upper respiratory infection, unspecified: Secondary | ICD-10-CM | POA: Insufficient documentation

## 2014-05-23 DIAGNOSIS — Z79899 Other long term (current) drug therapy: Secondary | ICD-10-CM | POA: Insufficient documentation

## 2014-05-23 DIAGNOSIS — Z791 Long term (current) use of non-steroidal anti-inflammatories (NSAID): Secondary | ICD-10-CM | POA: Insufficient documentation

## 2014-05-23 DIAGNOSIS — J45909 Unspecified asthma, uncomplicated: Secondary | ICD-10-CM | POA: Insufficient documentation

## 2014-05-23 DIAGNOSIS — Z792 Long term (current) use of antibiotics: Secondary | ICD-10-CM | POA: Insufficient documentation

## 2014-05-23 NOTE — Discharge Instructions (Signed)

## 2014-05-23 NOTE — ED Provider Notes (Signed)
CSN: [id number]     Arrival date & time 05/23/14  1353 History  This chart was scribed for non-physician practitioner, Santiago GladHeather Masashi Snowdon, PA-C working with Purvis SheffieldForrest Harrison, MD by Greggory StallionKayla Andersen, ED scribe. This patient was seen in room WTR8/WTR8 and the patient's care was started at 2:18 PM.   Chief Complaint  Patient presents with  . Nasal Congestion  . Cough   The history is provided by the patient. No language interpreter was used.    HPI Comments: Sara Taylor is a 24 y.o. female with history of asthma who presents to the Emergency Department complaining of nasal congestion, rhinorrhea, sore throat, and cough that stared yesterday. Also reports watery eyes. She has taken robitussin with some relief. Denies fever, chills, nausea, emesis. Denies sick contacts. Pt smokes 3-4 cigarettes per day.   Past Medical History  Diagnosis Date  . Hypertension    No past surgical history on file. Family History  Problem Relation Age of Onset  . Hypertension Maternal Grandmother   . Hypertension Maternal Grandfather   . Hypertension Paternal Grandmother   . Diabetes Paternal Grandmother    History  Substance Use Topics  . Smoking status: Current Every Day Smoker -- 0.20 packs/day    Types: Cigarettes  . Smokeless tobacco: Not on file  . Alcohol Use: Yes     Comment: less than monthly   OB History    No data available     Review of Systems  Constitutional: Negative for fever and chills.  HENT: Positive for congestion, rhinorrhea and sore throat.   Respiratory: Positive for cough.   Gastrointestinal: Negative for nausea and vomiting.  All other systems reviewed and are negative.  Allergies  Doxycycline  Home Medications   Prior to Admission medications   Medication Sig Start Date End Date Taking? Authorizing Provider  albuterol (PROVENTIL HFA;VENTOLIN HFA) 108 (90 BASE) MCG/ACT inhaler Inhale 2 puffs into the lungs every 4 (four) hours as needed for wheezing or shortness of  breath.    Historical Provider, MD  hydrochlorothiazide (HYDRODIURIL) 25 MG tablet Take 1 tablet (25 mg total) by mouth daily. 03/02/14   Tommie SamsJayce G Cook, DO  methocarbamol (ROBAXIN) 500 MG tablet Take 1 tablet (500 mg total) by mouth 2 (two) times daily. 04/30/14   Jennifer L Piepenbrink, PA-C  metroNIDAZOLE (FLAGYL) 500 MG tablet Take 1 tablet (500 mg total) by mouth 2 (two) times daily. 03/02/14   Tommie SamsJayce G Cook, DO  naproxen (NAPROSYN) 500 MG tablet Take 1 tablet (500 mg total) by mouth 2 (two) times daily with a meal. 04/30/14   Jennifer L Piepenbrink, PA-C  OVER THE COUNTER MEDICATION Take 1 tablet by mouth every 6 (six) hours as needed (for cough). Cold and allergy medication    Historical Provider, MD  traMADol-acetaminophen (ULTRACET) 37.5-325 MG per tablet Take 1 tablet by mouth every 6 (six) hours as needed. 03/12/14   Garlon HatchetLisa M Sanders, PA-C   BP 146/80 mmHg  Pulse 84  Temp(Src) 98 F (36.7 C) (Oral)  Resp 16  SpO2 100%   Physical Exam  Constitutional: She appears well-developed and well-nourished.  HENT:  Head: Normocephalic and atraumatic.  Right Ear: Tympanic membrane and ear canal normal.  Left Ear: Tympanic membrane and ear canal normal.  Nose: Right sinus exhibits no maxillary sinus tenderness and no frontal sinus tenderness. Left sinus exhibits no maxillary sinus tenderness and no frontal sinus tenderness.  Mouth/Throat: Uvula is midline, oropharynx is clear and moist and mucous membranes are normal.  No oropharyngeal exudate or posterior oropharyngeal erythema.  Eyes: EOM are normal. Pupils are equal, round, and reactive to light.  Neck: Normal range of motion. Neck supple.  Cardiovascular: Normal rate, regular rhythm and normal heart sounds.   Pulmonary/Chest: Effort normal and breath sounds normal. She has no wheezes.  Musculoskeletal: Normal range of motion.  Lymphadenopathy:    She has no cervical adenopathy.  Neurological: She is alert.  Skin: Skin is warm and dry.   Psychiatric: She has a normal mood and affect. Her behavior is normal.  Nursing note and vitals reviewed.   ED Course  Procedures (including critical care time)  DIAGNOSTIC STUDIES: Oxygen Saturation is 100% on RA, normal by my interpretation.    COORDINATION OF CARE: 2:22 PM-Discussed treatment plan which includes symptomatic treatment with pt at bedside and pt agreed to plan.   Labs Review Labs Reviewed - No data to display  Imaging Review No results found.   EKG Interpretation None      MDM   Final diagnoses:  URI (upper respiratory infection)    Symptoms most consistent with Viral URI.  Symptoms began yesterday.  Pulse ox 100 on RA.  She is afebrile.  Lungs CTAB.  Therefore, do not feel that CXR is indicated at this time.  Supportive care recommended.  Feel that she is stable for discharge.  Return precautions given.  I personally performed the services described in this documentation, which was scribed in my presence. The recorded information has been reviewed and is accurate.  Santiago Glad, PA-C 05/23/14 1558  Purvis Sheffield, MD 05/23/14 [id number]

## 2014-05-23 NOTE — ED Notes (Signed)
Pt c/o nasal congestion and cough since yesterday.

## 2014-05-30 ENCOUNTER — Emergency Department (HOSPITAL_COMMUNITY)
Admission: EM | Admit: 2014-05-30 | Discharge: 2014-05-30 | Disposition: A | Discharge status: Home / Self Care | Payer: Self-pay | Attending: Emergency Medicine | Admitting: Emergency Medicine

## 2014-05-30 ENCOUNTER — Encounter (HOSPITAL_COMMUNITY): Payer: Self-pay | Admitting: Emergency Medicine

## 2014-05-30 ENCOUNTER — Emergency Department (HOSPITAL_COMMUNITY): Payer: Self-pay

## 2014-05-30 DIAGNOSIS — I10 Essential (primary) hypertension: Secondary | ICD-10-CM | POA: Insufficient documentation

## 2014-05-30 DIAGNOSIS — R52 Pain, unspecified: Secondary | ICD-10-CM

## 2014-05-30 DIAGNOSIS — Z791 Long term (current) use of non-steroidal anti-inflammatories (NSAID): Secondary | ICD-10-CM | POA: Insufficient documentation

## 2014-05-30 DIAGNOSIS — Y998 Other external cause status: Secondary | ICD-10-CM | POA: Insufficient documentation

## 2014-05-30 DIAGNOSIS — X58XXXA Exposure to other specified factors, initial encounter: Secondary | ICD-10-CM | POA: Insufficient documentation

## 2014-05-30 DIAGNOSIS — Y9289 Other specified places as the place of occurrence of the external cause: Secondary | ICD-10-CM | POA: Insufficient documentation

## 2014-05-30 DIAGNOSIS — S46911A Strain of unspecified muscle, fascia and tendon at shoulder and upper arm level, right arm, initial encounter: Secondary | ICD-10-CM | POA: Insufficient documentation

## 2014-05-30 DIAGNOSIS — Y9389 Activity, other specified: Secondary | ICD-10-CM | POA: Insufficient documentation

## 2014-05-30 DIAGNOSIS — Z79899 Other long term (current) drug therapy: Secondary | ICD-10-CM | POA: Insufficient documentation

## 2014-05-30 DIAGNOSIS — Z720 Tobacco use: Secondary | ICD-10-CM | POA: Insufficient documentation

## 2014-05-30 DIAGNOSIS — Z792 Long term (current) use of antibiotics: Secondary | ICD-10-CM | POA: Insufficient documentation

## 2014-05-30 DIAGNOSIS — S46811A Strain of other muscles, fascia and tendons at shoulder and upper arm level, right arm, initial encounter: Secondary | ICD-10-CM

## 2014-05-30 MED ORDER — IBUPROFEN 800 MG PO TABS: 800.0000 mg | ORAL_TABLET | Freq: Three times a day (TID) | ORAL | Status: DC

## 2014-05-30 MED ORDER — CYCLOBENZAPRINE HCL 10 MG PO TABS: 10.0000 mg | ORAL_TABLET | Freq: Two times a day (BID) | ORAL | Status: DC | PRN

## 2014-05-30 NOTE — ED Notes (Signed)
Declined W/C at D/C and was escorted to lobby by RN. 

## 2014-05-30 NOTE — ED Notes (Signed)
Pt back from X-ray.  

## 2014-05-30 NOTE — ED Provider Notes (Signed)
CSN: [id number]     Arrival date & time 05/30/14  1433 History  This chart was scribed for non-physician practitioner, Langston Masker, PA-C working with Rolland Porter, MD by Greggory Stallion, ED scribe. This patient was seen in room TR08C/TR08C and the patient's care was started at 4:49 PM.   Chief Complaint  Patient presents with  . Shoulder Pain   The history is provided by the patient. No language interpreter was used.    HPI Comments: Sara Taylor is a 24 y.o. female who presents to the Emergency Department complaining of right shoulder pain that started 2 days ago when she woke up. Reports associated shoulder swelling that is now resolved. Denies injury but thinks she slept on her shoulder wrong. Certain movements worsen pain. Pt has taken ibuprofen with some relief. Denies neck pain, elbow pain.   Past Medical History  Diagnosis Date  . Hypertension    History reviewed. No pertinent past surgical history. Family History  Problem Relation Age of Onset  . Hypertension Maternal Grandmother   . Hypertension Maternal Grandfather   . Hypertension Paternal Grandmother   . Diabetes Paternal Grandmother    History  Substance Use Topics  . Smoking status: Current Every Day Smoker -- 0.20 packs/day    Types: Cigarettes  . Smokeless tobacco: Not on file  . Alcohol Use: Yes     Comment: less than monthly   OB History    No data available     Review of Systems  Musculoskeletal: Positive for arthralgias. Negative for neck pain.  All other systems reviewed and are negative.  Allergies  Doxycycline  Home Medications   Prior to Admission medications   Medication Sig Start Date End Date Taking? Authorizing Provider  albuterol (PROVENTIL HFA;VENTOLIN HFA) 108 (90 BASE) MCG/ACT inhaler Inhale 2 puffs into the lungs every 4 (four) hours as needed for wheezing or shortness of breath.    Historical Provider, MD  hydrochlorothiazide (HYDRODIURIL) 25 MG tablet Take 1 tablet (25 mg total) by  mouth daily. 03/02/14   Tommie Sams, DO  methocarbamol (ROBAXIN) 500 MG tablet Take 1 tablet (500 mg total) by mouth 2 (two) times daily. 04/30/14   Jennifer L Piepenbrink, PA-C  metroNIDAZOLE (FLAGYL) 500 MG tablet Take 1 tablet (500 mg total) by mouth 2 (two) times daily. 03/02/14   Tommie Sams, DO  naproxen (NAPROSYN) 500 MG tablet Take 1 tablet (500 mg total) by mouth 2 (two) times daily with a meal. 04/30/14   Jennifer L Piepenbrink, PA-C  OVER THE COUNTER MEDICATION Take 1 tablet by mouth every 6 (six) hours as needed (for cough). Cold and allergy medication    Historical Provider, MD  traMADol-acetaminophen (ULTRACET) 37.5-325 MG per tablet Take 1 tablet by mouth every 6 (six) hours as needed. 03/12/14   Garlon Hatchet, PA-C   BP 128/82 mmHg  Pulse 70  Temp(Src) 98.2 F (36.8 C) (Oral)  Resp 16  Ht  (1.651 m)  Wt 245 lb (111.131 kg)  BMI 40.77 kg/m2  SpO2 99%  LMP 05/06/2014   Physical Exam  Constitutional: She is oriented to person, place, and time. She appears well-developed and well-nourished. No distress.  HENT:  Head: Normocephalic and atraumatic.  Eyes: Conjunctivae and EOM are normal.  Neck: Neck supple.  Cardiovascular: Normal rate.   Pulmonary/Chest: Effort normal. No respiratory distress.  Musculoskeletal: Normal range of motion.  Tenderness to right trapezius.   Neurological: She is alert and oriented to person, place, and  time.  Skin: Skin is warm and dry.  Psychiatric: She has a normal mood and affect. Her behavior is normal.  Nursing note and vitals reviewed.   ED Course  Procedures (including critical care time)  DIAGNOSTIC STUDIES: Oxygen Saturation is 99% on RA, normal by my interpretation.    COORDINATION OF CARE: 4:50 PM-Discussed treatment plan which includes a muscle relaxer and continuing ibuprofen with pt at bedside and pt agreed to plan.   Labs Review Labs Reviewed - No data to display  Imaging Review Dg Shoulder Right  05/30/2014    CLINICAL DATA:  Right shoulder pain today.  No trauma.  EXAM: RIGHT SHOULDER - 2+ VIEW  COMPARISON:  None.  FINDINGS: Examination demonstrates no evidence of fracture or dislocation. No significant degenerative changes. Suggestion of an os acromiale normal variant.  IMPRESSION: No acute findings.   Electronically Signed   By: Elberta Fortisaniel  Boyle M.D.   On: 05/30/2014 16:55     EKG Interpretation None      MDM   Final diagnoses:  Trapezius muscle strain, right, initial encounter      I personally performed the services described in this documentation, which was scribed in my presence. The recorded information has been reviewed and is accurate.  Lonia SkinnerLeslie K CavaleroSofia, PA-C 05/30/14 1930  Rolland PorterMark James, MD 06/04/14 [phone]

## 2014-05-30 NOTE — ED Notes (Signed)
Pt states she woke up Saturday morning and her right shoulder was swollen and "felt like the muscle was tight" since then the swelling has gone down but pain is still there. Increase pain with movement

## 2014-05-30 NOTE — Discharge Instructions (Signed)

## 2014-06-11 ENCOUNTER — Emergency Department (HOSPITAL_COMMUNITY): Payer: Self-pay

## 2014-06-11 ENCOUNTER — Encounter (HOSPITAL_COMMUNITY): Payer: Self-pay | Admitting: *Deleted

## 2014-06-11 ENCOUNTER — Emergency Department (HOSPITAL_COMMUNITY)
Admission: EM | Admit: 2014-06-11 | Discharge: 2014-06-11 | Disposition: A | Discharge status: Home / Self Care | Payer: Self-pay | Attending: Emergency Medicine | Admitting: Emergency Medicine

## 2014-06-11 DIAGNOSIS — I10 Essential (primary) hypertension: Secondary | ICD-10-CM | POA: Insufficient documentation

## 2014-06-11 DIAGNOSIS — M25562 Pain in left knee: Secondary | ICD-10-CM | POA: Insufficient documentation

## 2014-06-11 DIAGNOSIS — Z791 Long term (current) use of non-steroidal anti-inflammatories (NSAID): Secondary | ICD-10-CM | POA: Insufficient documentation

## 2014-06-11 DIAGNOSIS — Z792 Long term (current) use of antibiotics: Secondary | ICD-10-CM | POA: Insufficient documentation

## 2014-06-11 DIAGNOSIS — Z79899 Other long term (current) drug therapy: Secondary | ICD-10-CM | POA: Insufficient documentation

## 2014-06-11 DIAGNOSIS — Z720 Tobacco use: Secondary | ICD-10-CM | POA: Insufficient documentation

## 2014-06-11 MED ORDER — NAPROXEN 375 MG PO TABS: 375.0000 mg | ORAL_TABLET | Freq: Two times a day (BID) | ORAL | Status: DC

## 2014-06-11 NOTE — ED Provider Notes (Signed)
CSN: [id number]     Arrival date & time 06/11/14  1826 History  This chart was scribed for non-physician practitioner, Herma Ard, working with Samuel Jester, DO by Freida Busman, ED Scribe. This patient was seen in room TR11C/TR11C and the patient's care was started at 8:16 PM.    Chief Complaint  Patient presents with  . Knee Pain     The history is provided by the patient. No language interpreter was used.     HPI Comments:  PRIM MORACE is a 24 y.o. female with a h/o injury to her left knee in childhood who presents to the Emergency Department complaining of moderate constant left knee pain for two weeks. She notes the pain has worsened in the last two days and she has noticed swelling to the knee in the last 2 days but she currently is not having any swelling- it resolved.. She denies recent injury/fall and notes that she stands for long periods of time at work. She also denies swelling to the calf/lower leg.  Denies SOB or CP. No alleviating factors or associated symptoms noted.   Past Medical History  Diagnosis Date  . Hypertension    History reviewed. No pertinent past surgical history. Family History  Problem Relation Age of Onset  . Hypertension Maternal Grandmother   . Hypertension Maternal Grandfather   . Hypertension Paternal Grandmother   . Diabetes Paternal Grandmother    History  Substance Use Topics  . Smoking status: Current Every Day Smoker -- 0.20 packs/day    Types: Cigarettes  . Smokeless tobacco: Not on file  . Alcohol Use: Yes     Comment: less than monthly   OB History    No data available     Review of Systems  Constitutional: Negative for fever and chills.  Musculoskeletal: Positive for myalgias and arthralgias.  All other systems reviewed and are negative.     Allergies  Doxycycline  Home Medications   Prior to Admission medications   Medication Sig Start Date End Date Taking? Authorizing Provider  albuterol  (PROVENTIL HFA;VENTOLIN HFA) 108 (90 BASE) MCG/ACT inhaler Inhale 2 puffs into the lungs every 4 (four) hours as needed for wheezing or shortness of breath.    Historical Provider, MD  cyclobenzaprine (FLEXERIL) 10 MG tablet Take 1 tablet (10 mg total) by mouth 2 (two) times daily as needed for muscle spasms. 05/30/14   Elson Areas, PA-C  hydrochlorothiazide (HYDRODIURIL) 25 MG tablet Take 1 tablet (25 mg total) by mouth daily. 03/02/14   Tommie Sams, DO  ibuprofen (ADVIL,MOTRIN) 800 MG tablet Take 1 tablet (800 mg total) by mouth 3 (three) times daily. 05/30/14   Elson Areas, PA-C  methocarbamol (ROBAXIN) 500 MG tablet Take 1 tablet (500 mg total) by mouth 2 (two) times daily. 04/30/14   Jennifer L Piepenbrink, PA-C  metroNIDAZOLE (FLAGYL) 500 MG tablet Take 1 tablet (500 mg total) by mouth 2 (two) times daily. 03/02/14   Tommie Sams, DO  naproxen (NAPROSYN) 375 MG tablet Take 1 tablet (375 mg total) by mouth 2 (two) times daily. 06/11/14   Alezandra Egli Irine Seal, PA-C  naproxen (NAPROSYN) 500 MG tablet Take 1 tablet (500 mg total) by mouth 2 (two) times daily with a meal. 04/30/14   Jennifer L Piepenbrink, PA-C  OVER THE COUNTER MEDICATION Take 1 tablet by mouth every 6 (six) hours as needed (for cough). Cold and allergy medication    Historical Provider, MD  traMADol-acetaminophen Alphonzo Severance)  37.5-325 MG per tablet Take 1 tablet by mouth every 6 (six) hours as needed. 03/12/14   Garlon HatchetLisa M Sanders, PA-C   BP 136/69 mmHg  Pulse 62  Temp(Src) 98.7 F (37.1 C) (Oral)  Resp 18  Ht 5\' 5"  (1.651 m)  Wt 245 lb (111.131 kg)  BMI 40.77 kg/m2  SpO2 100%  LMP 05/06/2014 Physical Exam  Constitutional: She is oriented to person, place, and time. She appears well-developed and well-nourished. No distress.  HENT:  Head: Normocephalic and atraumatic.  Eyes: Conjunctivae are normal.  Cardiovascular: Normal rate.   Pulmonary/Chest: Effort normal.  Abdominal: She exhibits no distension.  Musculoskeletal:   Exam limited due to body habitus but no obvious swelling noted. Strong pedal pulse and FROM to left leg no rash or TTP.  No swelling to the calf or TTP of the left calf. No claudication  NOrmal strength to leg and intact sensation to touch  Neurological: She is alert and oriented to person, place, and time.  Skin: Skin is warm and dry. No rash noted.  Psychiatric: She has a normal mood and affect.  Nursing note and vitals reviewed.   ED Course  Procedures   DIAGNOSTIC STUDIES:  Oxygen Saturation is 99% on RA, normal by my interpretation.    COORDINATION OF CARE:  8:34 PM Advised pt to stay off of the extremity and to apply ice.  Will send home with crutches. Discussed treatment plan with pt at bedside and pt agreed to plan.  Labs Review Labs Reviewed - No data to display  Imaging Review Dg Knee Complete 4 Views Left  06/11/2014   CLINICAL DATA:  Knee pain and swelling  EXAM: LEFT KNEE - COMPLETE 4+ VIEW  COMPARISON:  None.  FINDINGS: Frontal, lateral, and bilateral oblique views were obtained. There is no fracture or dislocation. No joint effusion. Joint spaces appear intact. No erosive change.  IMPRESSION: No fracture or dislocation.  No appreciable arthropathy.   Electronically Signed   By: Bretta BangWilliam  Woodruff M.D.   On: 06/11/2014 19:49     EKG Interpretation None      MDM   Final diagnoses:  Left knee pain    23 y.o.Sara Taylor's evaluation in the Emergency Department is complete. It has been determined that no acute conditions requiring further emergency intervention are present at this time. The patient/guardian have been advised of the diagnosis and plan. We have discussed signs and symptoms that warrant return to the ED, such as changes or worsening in symptoms.  Vital signs are stable at discharge. Filed Vitals:   06/11/14 2032  BP: 136/69  Pulse: 62  Temp: 98.7 F (37.1 C)  Resp: 18    Patient/guardian has voiced understanding and agreed to follow-up  with the PCP or specialist.  I personally performed the services described in this documentation, which was scribed in my presence. The recorded information has been reviewed and is accurate.    Dorthula Matasiffany G Jos Cygan, PA-C 06/11/14 2035  Samuel JesterKathleen McManus, DO 06/13/14 1357

## 2014-06-11 NOTE — ED Notes (Signed)
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  Since yesterday she has had lt knee pain and swelling.  No known injury. She just started this job jan 2nd  lmop dec 25th

## 2014-06-11 NOTE — Discharge Instructions (Signed)

## 2014-06-20 ENCOUNTER — Emergency Department (HOSPITAL_COMMUNITY)
Admission: EM | Admit: 2014-06-20 | Discharge: 2014-06-20 | Disposition: A | Discharge status: Home / Self Care | Payer: Self-pay | Attending: Emergency Medicine | Admitting: Emergency Medicine

## 2014-06-20 ENCOUNTER — Encounter (HOSPITAL_COMMUNITY): Payer: Self-pay | Admitting: Emergency Medicine

## 2014-06-20 DIAGNOSIS — K088 Other specified disorders of teeth and supporting structures: Secondary | ICD-10-CM | POA: Insufficient documentation

## 2014-06-20 DIAGNOSIS — Z792 Long term (current) use of antibiotics: Secondary | ICD-10-CM | POA: Insufficient documentation

## 2014-06-20 DIAGNOSIS — Z791 Long term (current) use of non-steroidal anti-inflammatories (NSAID): Secondary | ICD-10-CM | POA: Insufficient documentation

## 2014-06-20 DIAGNOSIS — I10 Essential (primary) hypertension: Secondary | ICD-10-CM | POA: Insufficient documentation

## 2014-06-20 DIAGNOSIS — Z3202 Encounter for pregnancy test, result negative: Secondary | ICD-10-CM | POA: Insufficient documentation

## 2014-06-20 DIAGNOSIS — K0889 Other specified disorders of teeth and supporting structures: Secondary | ICD-10-CM

## 2014-06-20 DIAGNOSIS — R51 Headache: Secondary | ICD-10-CM | POA: Insufficient documentation

## 2014-06-20 DIAGNOSIS — Z79899 Other long term (current) drug therapy: Secondary | ICD-10-CM | POA: Insufficient documentation

## 2014-06-20 DIAGNOSIS — Z720 Tobacco use: Secondary | ICD-10-CM | POA: Insufficient documentation

## 2014-06-20 DIAGNOSIS — H6122 Impacted cerumen, left ear: Secondary | ICD-10-CM | POA: Insufficient documentation

## 2014-06-20 MED ORDER — OXYCODONE-ACETAMINOPHEN 5-325 MG PO TABS
2.0000 | ORAL_TABLET | Freq: Once | ORAL | Status: AC
  Administered 2014-06-20: 2 via ORAL
  Filled 2014-06-20: qty 2

## 2014-06-20 NOTE — ED Notes (Signed)
Pt reports left lower wisdom tooth pain since last night causing h/a. Pt notes an odor, sore throat.  Pt alert and oriented.

## 2014-06-20 NOTE — ED Provider Notes (Signed)
CSN: [id number]     Arrival date & time 06/20/14  1514 History   First MD Initiated Contact with Patient 06/20/14 1802     Chief Complaint  Patient presents with  . Headache     (Consider location/radiation/quality/duration/timing/severity/associated sxs/prior Treatment) Patient is a 24 y.o. female presenting with headaches.  Headache Pain location:  Frontal (Left) Quality:  Dull Radiates to:  Does not radiate Onset quality:  Gradual Duration:  2 days Timing:  Constant Progression:  Unchanged Chronicity:  Recurrent Context comment:  L dental pain Relieved by:  Nothing Exacerbated by: chewing, hot/cold liquids. Ineffective treatments:  None tried Associated symptoms: no abdominal pain, no fever, no focal weakness, no hearing loss, no numbness, no photophobia, no tingling and no vomiting     Past Medical History  Diagnosis Date  . Hypertension    History reviewed. No pertinent past surgical history. Family History  Problem Relation Age of Onset  . Hypertension Maternal Grandmother   . Hypertension Maternal Grandfather   . Hypertension Paternal Grandmother   . Diabetes Paternal Grandmother    History  Substance Use Topics  . Smoking status: Current Every Day Smoker -- 0.20 packs/day    Types: Cigarettes  . Smokeless tobacco: Not on file  . Alcohol Use: Yes     Comment: less than monthly   OB History    No data available     Review of Systems  Constitutional: Negative for fever.  HENT: Negative for hearing loss.   Eyes: Negative for photophobia.  Gastrointestinal: Negative for vomiting and abdominal pain.  Neurological: Positive for headaches. Negative for focal weakness and numbness.  All other systems reviewed and are negative.     Allergies  Doxycycline  Home Medications   Prior to Admission medications   Medication Sig Start Date End Date Taking? Authorizing Provider  albuterol (PROVENTIL HFA;VENTOLIN HFA) 108 (90 BASE) MCG/ACT inhaler Inhale 2  puffs into the lungs every 4 (four) hours as needed for wheezing or shortness of breath.    Historical Provider, MD  cyclobenzaprine (FLEXERIL) 10 MG tablet Take 1 tablet (10 mg total) by mouth 2 (two) times daily as needed for muscle spasms. 05/30/14   Elson Areas, PA-C  hydrochlorothiazide (HYDRODIURIL) 25 MG tablet Take 1 tablet (25 mg total) by mouth daily. 03/02/14   Tommie Sams, DO  ibuprofen (ADVIL,MOTRIN) 800 MG tablet Take 1 tablet (800 mg total) by mouth 3 (three) times daily. 05/30/14   Elson Areas, PA-C  methocarbamol (ROBAXIN) 500 MG tablet Take 1 tablet (500 mg total) by mouth 2 (two) times daily. 04/30/14   Jennifer L Piepenbrink, PA-C  metroNIDAZOLE (FLAGYL) 500 MG tablet Take 1 tablet (500 mg total) by mouth 2 (two) times daily. 03/02/14   Tommie Sams, DO  naproxen (NAPROSYN) 375 MG tablet Take 1 tablet (375 mg total) by mouth 2 (two) times daily. 06/11/14   Tiffany Irine Seal, PA-C  naproxen (NAPROSYN) 500 MG tablet Take 1 tablet (500 mg total) by mouth 2 (two) times daily with a meal. 04/30/14   Jennifer L Piepenbrink, PA-C  OVER THE COUNTER MEDICATION Take 1 tablet by mouth every 6 (six) hours as needed (for cough). Cold and allergy medication    Historical Provider, MD  traMADol-acetaminophen (ULTRACET) 37.5-325 MG per tablet Take 1 tablet by mouth every 6 (six) hours as needed. 03/12/14   Garlon Hatchet, PA-C   BP 138/89 mmHg  Pulse 96  Temp(Src) 98.8 F (37.1 C) (Oral)  Resp  22  Ht 5\' 5"  (1.651 m)  Wt 240 lb (108.863 kg)  BMI 39.94 kg/m2  SpO2 98%  LMP 05/06/2014 Physical Exam  Constitutional: She is oriented to person, place, and time. She appears well-developed and well-nourished.  HENT:  Head: Normocephalic and atraumatic.  Right Ear: Tympanic membrane and external ear normal.  Left Ear: Tympanic membrane and external ear normal.  Mouth/Throat: Oropharynx is clear and moist.  L TM with cerumen impaction.  Tonsils enlarged bilaterally, no exudate, no erythema   Eyes: Conjunctivae and EOM are normal. Pupils are equal, round, and reactive to light.  Neck: Normal range of motion. Neck supple.  Cardiovascular: Normal rate, regular rhythm, normal heart sounds and intact distal pulses.   Pulmonary/Chest: Effort normal and breath sounds normal.  Abdominal: Soft. Bowel sounds are normal. There is no tenderness.  Musculoskeletal: Normal range of motion.  Neurological: She is alert and oriented to person, place, and time.  Skin: Skin is warm and dry.  Vitals reviewed.   ED Course  EAR CERUMEN REMOVAL Date/Time: 06/20/2014 6:18 PM Performed by: Mirian MoGENTRY, MATTHEW Authorized by: Mirian MoGENTRY, MATTHEW Consent: Verbal consent obtained. Local anesthetic: none Location details: left ear Procedure type: curette Patient sedated: no Patient tolerance: Patient tolerated the procedure well with no immediate complications   (including critical care time) Labs Review Labs Reviewed  POC URINE PREG, ED    Imaging Review No results found.   EKG Interpretation None      MDM   Final diagnoses:  Pain, dental  Cerumen impaction, left    24 y.o. female with pertinent PMH of HTN presents with L frontal and periauricular HA x 2 days.  Pain centered on L superior 3rd molar, worse with eating.  No signs of mastoiditis.  Cleared cerumen impaction in L ear.  Physical exam as above.  Offered both dental block and upreg, pt declined.  DC home in stable condition.    I have reviewed all laboratory and imaging studies if ordered as above  1. Pain, dental   2. Cerumen impaction, left         Mirian MoMatthew Gentry, MD 06/20/14 [phone]

## 2014-06-20 NOTE — ED Notes (Signed)
Pt c/o temporal HA x 2 days; pt denies vision change or nausea

## 2014-06-20 NOTE — ED Notes (Signed)
Pt made aware to return if symptoms worsen or if any life threatening symptoms occur.   

## 2014-06-20 NOTE — Discharge Instructions (Signed)
Dental Pain A tooth ache may be caused by cavities (tooth decay). Cavities expose the nerve of the tooth to air and hot or cold temperatures. It may come from an infection or abscess (also called a boil or furuncle) around your tooth. It is also often caused by dental caries (tooth decay). This causes the pain you are having. DIAGNOSIS  Your caregiver can diagnose this problem by exam. TREATMENT   If caused by an infection, it may be treated with medications which kill germs (antibiotics) and pain medications as prescribed by your caregiver. Take medications as directed.  Only take over-the-counter or prescription medicines for pain, discomfort, or fever as directed by your caregiver.  Whether the tooth ache today is caused by infection or dental disease, you should see your dentist as soon as possible for further care. SEEK MEDICAL CARE IF: The exam and treatment you received today has been provided on an emergency basis only. This is not a substitute for complete medical or dental care. If your problem worsens or new problems (symptoms) appear, and you are unable to meet with your dentist, call or return to this location. SEEK IMMEDIATE MEDICAL CARE IF:   You have a fever.  You develop redness and swelling of your face, jaw, or neck.  You are unable to open your mouth.  You have severe pain uncontrolled by pain medicine. MAKE SURE YOU:   Understand these instructions.  Will watch your condition.  Will get help right away if you are not doing well or get worse. Document Released: 04/29/2005 Document Revised: 07/22/2011 Document Reviewed: 12/16/2007 [facility] Patient Information 2015 [place]Care, [place]. This information is not intended to replace advice given to you by your health care provider. Make sure you discuss any questions you have with your health care provider.  Cerumen Impaction A cerumen impaction is when the wax in your ear forms a plug. This plug usually causes reduced  hearing. Sometimes it also causes an earache or dizziness. Removing a cerumen impaction can be difficult and painful. The wax sticks to the ear canal. The canal is sensitive and bleeds easily. If you try to remove a heavy wax buildup with a cotton tipped swab, you may push it in further. Irrigation with water, suction, and small ear curettes may be used to clear out the wax. If the impaction is fixed to the skin in the ear canal, ear drops may be needed for a few days to loosen the wax. People who build up a lot of wax frequently can use ear wax removal products available in your local drugstore. SEEK MEDICAL CARE IF:  You develop an earache, increased hearing loss, or marked dizziness. Document Released: 06/06/2004 Document Revised: 07/22/2011 Document Reviewed: 07/27/2009 [facility] HospitalExit[facility] Patient Information 2015 [place]ExitCare, [place]LLC. This information is not intended to replace advice given to you by your health care provider. Make sure you discuss any questions you have with your health care provider.   Emergency Department Resource Guide 1) Find a Doctor and Pay Out of Pocket Although you won't have to find out who is covered by your insurance plan, it is a good idea to ask around and get recommendations. You will then need to call the office and see if the doctor you have chosen will accept you as a new patient and what types of options they offer for patients who are self-pay. Some doctors offer discounts or will set up payment plans for their patients who do not have insurance, but you will need to  ask so you aren't surprised when you get to your appointment.  2) Contact Your Local Health Department Not all health departments have doctors that can see patients for sick visits, but many do, so it is worth a call to see if yours does. If you don't know where your local health department is, you can check in your phone book. The CDC also has a tool to help you locate your state's health department, and many  state websites also have listings of all of their local health departments.  3) Find a [facility] If your illness is not likely to be very severe or complicated, you may want to try a walk in clinic. These are popping up all over the country in pharmacies, drugstores, and shopping centers. They're usually staffed by nurse practitioners or physician assistants that have been trained to treat common illnesses and complaints. They're usually fairly quick and inexpensive. However, if you have serious medical issues or chronic medical problems, these are probably not your best option.  No Primary Care Doctor: - Call Health Connect at  [phone] - they can help you locate a primary care doctor that  accepts your insurance, provides certain services, etc. - Physician Referral Service- [phone]  Chronic Pain Problems: Organization         Address  Phone   Notes  [facility]  [phone] Patients need to be referred by their primary care doctor.   Medication Assistance: Organization         Address  Phone   Notes  [facility] [place] Medication [facility] [address] [place], [place] [address] [phone] --Must be a resident of [facility] -- Must have NO insurance coverage whatsoever (no Medicaid/ Medicare, etc.) -- The pt. MUST have a primary care doctor that directs their care regularly and follows them in the community   Med[facility]ist  [phone]   [employer]  [phone]    Agencies that provide inexpensive medical care: Organization         Address  Phone   Notes  [place]  [phone]   [facility]    [phone]   [facility] [place], [place] [address] [phone]   [place] [address], [place] [phone]   Planned Parenthood    [phone]   [facility]    [phone]   [place] and  [address], [place] Phone:  [phone], Fax:  [phone] Hours of Operation:  9 am - 6 pm, M-F.  Also accepts Medicaid/Medicare and self-pay.  [facility] for [address], [address], [place] Phone: [phone], Fax: [phone]. Hours of Operation:  8:30 am - 5:30 pm, M-F.  Also accepts Medicaid and self-pay.  [facility] [place] [address], [place] Phone: [phone]   [place], [place], [place] [phone], Ext. [phone] Mondays & Thursdays: 7-9 AM.  First 15 patients are seen on a first come, first serve basis.    [place] Providers:  Organization         Address  Phone   Notes  [facility] [address], [place] [phone] Also accepts self-pay patients.  [facility] [address], [place]  [phone])  [place], [address], [place] [phone]   [place] [address] [place] [phone]   Lucianne Lei [address], [place]   [phone] Only accepts [place] Access [place] patients after they have their name applied to their card.   Self-Pay (no insurance) in [facility]:  Organization         Address  Phone   Notes  Sickle Cell Patients, [facility] [place] [phone]   [facility] [place] [phone]   [facility] [place]  [place], [place], [place] [phone]   [facility]/Dr. Osei-Bonsu  [address] [place] or [address], [place] [phone] Phone number for both [place] and [place] locations is the same.  [facility] [address], [place] [phone]   [facility] [address] [place] or [address] [phone]   [facility] [address], [place] [phone], phone; [phone], fax Sees patients 1st and 3rd Saturday of every month.  Must not qualify for public or private insurance (i.e. Medicaid, Medicare, [place] Health Choice, Veterans' Benefits)  Household income should be no more than 200% of the poverty level The clinic cannot treat you if you are pregnant or think you are pregnant  Sexually transmitted diseases are not treated at the clinic.    Dental Care: Organization         Address  Phone  Notes  [facility] of [facility] [place] [phone] Accepts children up to age 72 who are enrolled in [place] or [place]; pregnant women with a Medicaid card; and children who have applied for Medicaid or [place] Health Choice, but were declined, whose parents can pay a reduced fee at time of service.  [facility] of [facility]  [address], [place] [phone] Accepts children up to age 54 who are enrolled in [place] or [place]; pregnant women with a Medicaid card; and children who have applied for Medicaid or [place] Health Choice, but were declined, whose parents can pay a reduced fee at time of service.  [place] Adult Dental Access PROGRAM  [place] [phone] Patients are seen by appointment only. Walk-ins are not accepted. [place] will see patients 68 years of age and older. Monday - Tuesday (8am-5pm) Most Wednesdays (8:30-5pm) $30 per visit, cash only  [facility] Adult Dental Access PROGRAM  [address], [facility] [phone] Patients are seen by appointment only. Walk-ins are not accepted. [place] will see patients 30 years of age and older. One Wednesday Evening (Monthly: Volunteer Based).  $30 per visit, cash only  [place]  [phone] for adults; Children under age 60, call Graduate Pediatric Dentistry at [phone]. Children aged 62-14, please call [phone] to request a pediatric application.  Dental services are provided in all areas of dental care including fillings, crowns and bridges, complete and partial dentures, implants, gum treatment, root canals, and extractions. Preventive care is also provided. Treatment is provided to both adults and children. Patients are selected via a lottery and there is often  a waiting list.   [facility] [address] [place]  [phone] [email or web address]   [facility] [address] [place], [place] [phone], Ext. [phone] Second and Fourth Thursday of each month, opens at 6:30 AM; Clinic ends at 9 AM.  Patients are seen on a first-come first-served basis, and a limited number are seen during each clinic.   [facility]  [address] Hillard Danker [place], [place] [phone]   Eligibility Requirements You must have lived in [place], [place], or [place] counties for at least the last three months.   You cannot be eligible for state or federal sponsored [employer], including [employer], [place], or [employer].   You generally cannot be eligible for healthcare insurance through your employer.    How to apply: Eligibility screenings are held every Tuesday and Wednesday afternoon from 1:00 pm until 4:00 pm. You do not need an appointment for the interview!  [facility] [address], [place], [place]   [place]  [place] Department  [place]  [phone]    Behavioral Health Resources in the Community: Intensive Outpatient Programs Organization         Address  Phone  Notes  [place]. [address] [place], [place]  [phone]   [facility] Outpatient [address] [place], [place]   ADS: Alcohol & Drug Svcs [address], [place], [place]   [place] [address]  [place], [place] or [phone]   Substance Abuse Resources Organization         Address  Phone  Notes  Alcohol and Drug Services  [phone]   [place]  [phone]   The [place]   Chinita Pester  [phone]   Residential & Outpatient Substance Abuse Program  [phone]   Psychological Services Organization         Address  Phone  Notes  [facility] [place]  [employer]  [phone]   [place] [address] [place] or [phone]    Mobile Crisis Teams Organization         Address  Phone  Notes  [facility], Mobile Crisis Care Unit  [phone]   [facility]ive Psychotherapeutic Services  [address] [place], [place]   [place] Levels [address] [place] [phone]    Self-Help/Support Groups Organization         Address  Phone             Notes  [place]. of [place] - variety of support groups  [place] Call for more information  Narcotics Anonymous (NA), [facility] [address], [employer] [place]  2 meetings at this location   Special educational needs teacher         Address  Phone  Notes  ASAP Residential Treatment [place],    [place]  [phone]   [facility]  [address] [place] [address], [place], [place]   [place], [place] [phone] Admissions: 8am-3pm M-F  Incentives Substance [place] [address]    [place], [place]   [facility] [address] [place], [place], [place]   The  [facility] [address]    [place], [place] [phone]   [facility] - Intensive Outpatient [address] Laurell Joseph[address], [place], [place] [phone]   [facility] ([facility] [address] [place].,  [place], [place] [phone] or [phone]   [facility] Services (RTS) [address] [place], [place] [phone] Accepts Medicaid  [facility] [place] [address],  [place] [phone] Substance Abuse/Addiction Treatment   [facility] Organization         Address  Phone  Notes  [facility]  [phone]   Angie Fava, PhD [address] Ervin Knack [place], [place]   [phone] or [phone]   [facility]   [address] [place], [place] [phone]   [facility] [address] [place], [place] [phone] Insurance/Medicaid/sponsorship through [facility] and Families [address]                                    [place], [place] [phone] Therapy/tele-psych/case  [facility] [address], [place] [phone]    Dr. Lolly Mustache  [phone]   [facility]  [employer] [facility] Dept. 1) [address],  2) [address] [place] 3)  [address], [place] [phone]  [phone]   [facility] Child Abuse Hotline (718)219-4467 or 847-667-2002 (After Hours)

## 2014-07-04 ENCOUNTER — Ambulatory Visit: Payer: Self-pay | Admitting: Family Medicine

## 2014-07-11 ENCOUNTER — Encounter (HOSPITAL_COMMUNITY): Payer: Self-pay | Admitting: *Deleted

## 2014-07-11 ENCOUNTER — Emergency Department (HOSPITAL_COMMUNITY)
Admission: EM | Admit: 2014-07-11 | Discharge: 2014-07-11 | Disposition: A | Discharge status: Home / Self Care | Payer: Self-pay | Attending: Emergency Medicine | Admitting: Emergency Medicine

## 2014-07-11 DIAGNOSIS — B9789 Other viral agents as the cause of diseases classified elsewhere: Secondary | ICD-10-CM

## 2014-07-11 DIAGNOSIS — Z79899 Other long term (current) drug therapy: Secondary | ICD-10-CM | POA: Insufficient documentation

## 2014-07-11 DIAGNOSIS — J069 Acute upper respiratory infection, unspecified: Secondary | ICD-10-CM | POA: Insufficient documentation

## 2014-07-11 DIAGNOSIS — Z720 Tobacco use: Secondary | ICD-10-CM | POA: Insufficient documentation

## 2014-07-11 DIAGNOSIS — I10 Essential (primary) hypertension: Secondary | ICD-10-CM | POA: Insufficient documentation

## 2014-07-11 MED ORDER — ALBUTEROL SULFATE HFA 108 (90 BASE) MCG/ACT IN AERS: 1.0000 | INHALATION_SPRAY | Freq: Four times a day (QID) | RESPIRATORY_TRACT | Status: DC | PRN

## 2014-07-11 NOTE — ED Notes (Signed)
Reviewed discharge instructions and prescription  Voiced understanding.

## 2014-07-11 NOTE — ED Notes (Signed)
Pt reports cough, congestion, sore throat since yesterday.

## 2014-07-11 NOTE — ED Provider Notes (Signed)
CSN: [id number]     Arrival date & time 07/11/14  1824 History   First MD Initiated Contact with Patient 07/11/14 2057     Chief Complaint  Patient presents with  . Nasal Congestion  . Cough    Patient is a 24 y.o. female presenting with cough. The history is provided by the patient.  Cough Severity:  Moderate Onset quality:  Gradual Duration:  1 day Timing:  Intermittent Progression:  Unchanged Chronicity:  New Smoker: yes   Relieved by:  Nothing Worsened by:  Nothing tried Associated symptoms: sinus congestion and sore throat   Associated symptoms: no fever     Past Medical History  Diagnosis Date  . Hypertension    History reviewed. No pertinent past surgical history. Family History  Problem Relation Age of Onset  . Hypertension Maternal Grandmother   . Hypertension Maternal Grandfather   . Hypertension Paternal Grandmother   . Diabetes Paternal Grandmother    History  Substance Use Topics  . Smoking status: Current Every Day Smoker -- 0.20 packs/day    Types: Cigarettes  . Smokeless tobacco: Not on file  . Alcohol Use: Yes     Comment: less than monthly   OB History    No data available     Review of Systems  Constitutional: Negative for fever.  HENT: Positive for sore throat.   Respiratory: Positive for cough.   Gastrointestinal: Negative for vomiting.      Allergies  Doxycycline  Home Medications   Prior to Admission medications   Medication Sig Start Date End Date Taking? Authorizing Provider  albuterol (PROVENTIL HFA;VENTOLIN HFA) 108 (90 BASE) MCG/ACT inhaler Inhale 1-2 puffs into the lungs every 6 (six) hours as needed for wheezing or shortness of breath. 07/11/14   Joya Gaskinsonald W Chakira Jachim, MD  hydrochlorothiazide (HYDRODIURIL) 25 MG tablet Take 1 tablet (25 mg total) by mouth daily. 03/02/14   Jayce G Cook, DO   BP 148/94 mmHg  Pulse 77  Temp(Src) 98.6 F (37 C) (Oral)  Resp 20  Ht 5\' 5"  (1.651 m)  Wt 250 lb (113.399 kg)  BMI 41.60 kg/m2   SpO2 99%  LMP 07/07/2014 Physical Exam CONSTITUTIONAL: Well developed/well nourished HEAD: Normocephalic/atraumatic EYES: EOMI/PERRL ENMT: Mucous membranes moist, uvula midline, no erythema/exudates noted NECK: supple no meningeal signs CV: S1/S2 noted, no murmurs/rubs/gallops noted LUNGS: Lungs are clear to auscultation bilaterally, no apparent distress ABDOMEN: soft, nontender, no rebound or guarding, bowel sounds noted throughout abdomen NEURO: Pt is awake/alert/appropriate, moves all extremitiesx4.  No facial droop.   SKIN: warm, color normal PSYCH: no abnormalities of mood noted, alert and oriented to situation  ED Course  Procedures  Pt well appearing, no distress Lung sounds clear However given cough and she is a smoker, will give albuterol MDI for symptomatic relief Suspect viral URI   MDM   Final diagnoses:  Viral URI with cough    Nursing notes including past medical history and social history reviewed and considered in documentation     Joya Gaskinsonald W Darletta Noblett, MD 07/11/14 2147

## 2014-07-11 NOTE — Discharge Instructions (Signed)
Upper Respiratory Infection, Adult An upper respiratory infection (URI) is also sometimes known as the common cold. The upper respiratory tract includes the nose, sinuses, throat, trachea, and bronchi. Bronchi are the airways leading to the lungs. Most people improve within 1 week, but symptoms can last up to 2 weeks. A residual cough may last even longer.  CAUSES Many different viruses can infect the tissues lining the upper respiratory tract. The tissues become irritated and inflamed and often become very moist. Mucus production is also common. A cold is contagious. You can easily spread the virus to others by oral contact. This includes kissing, sharing a glass, coughing, or sneezing. Touching your mouth or nose and then touching a surface, which is then touched by another person, can also spread the virus. SYMPTOMS  Symptoms typically develop 1 to 3 days after you come in contact with a cold virus. Symptoms vary from person to person. They may include:  Runny nose.  Sneezing.  Nasal congestion.  Sinus irritation.  Sore throat.  Loss of voice (laryngitis).  Cough.  Fatigue.  Muscle aches.  Loss of appetite.  Headache.  Low-grade fever. DIAGNOSIS  You might diagnose your own cold based on familiar symptoms, since most people get a cold 2 to 3 times a year. Your caregiver can confirm this based on your exam. Most importantly, your caregiver can check that your symptoms are not due to another disease such as strep throat, sinusitis, pneumonia, asthma, or epiglottitis. Blood tests, throat tests, and X-rays are not necessary to diagnose a common cold, but they may sometimes be helpful in excluding other more serious diseases. Your caregiver will decide if any further tests are required. RISKS AND COMPLICATIONS  You may be at risk for a more severe case of the common cold if you smoke cigarettes, have chronic heart disease (such as heart failure) or lung disease (such as asthma), or if  you have a weakened immune system. The very young and very old are also at risk for more serious infections. Bacterial sinusitis, middle ear infections, and bacterial pneumonia can complicate the common cold. The common cold can worsen asthma and chronic obstructive pulmonary disease (COPD). Sometimes, these complications can require emergency medical care and may be life-threatening. PREVENTION  The best way to protect against getting a cold is to practice good hygiene. Avoid oral or hand contact with people with cold symptoms. Wash your hands often if contact occurs. There is no clear evidence that vitamin C, vitamin E, echinacea, or exercise reduces the chance of developing a cold. However, it is always recommended to get plenty of rest and practice good nutrition. TREATMENT  Treatment is directed at relieving symptoms. There is no cure. Antibiotics are not effective, because the infection is caused by a virus, not by bacteria. Treatment may include:  Increased fluid intake. Sports drinks offer valuable electrolytes, sugars, and fluids.  Breathing heated mist or steam (vaporizer or shower).  Eating chicken soup or other clear broths, and maintaining good nutrition.  Getting plenty of rest.  Using gargles or lozenges for comfort.  Controlling fevers with ibuprofen or acetaminophen as directed by your caregiver.  Increasing usage of your inhaler if you have asthma. Zinc gel and zinc lozenges, taken in the first 24 hours of the common cold, can shorten the duration and lessen the severity of symptoms. Pain medicines may help with fever, muscle aches, and throat pain. A variety of non-prescription medicines are available to treat congestion and runny nose. Your caregiver   can make recommendations and may suggest nasal or lung inhalers for other symptoms.  HOME CARE INSTRUCTIONS   Only take over-the-counter or prescription medicines for pain, discomfort, or fever as directed by your  caregiver.  Use a warm mist humidifier or inhale steam from a shower to increase air moisture. This may keep secretions moist and make it easier to breathe.  Drink enough water and fluids to keep your urine clear or pale yellow.  Rest as needed.  Return to work when your temperature has returned to normal or as your caregiver advises. You may need to stay home longer to avoid infecting others. You can also use a face mask and careful hand washing to prevent spread of the virus. SEEK MEDICAL CARE IF:   After the first few days, you feel you are getting worse rather than better.  You need your caregiver's advice about medicines to control symptoms.  You develop chills, worsening shortness of breath, or brown or red sputum. These may be signs of pneumonia.  You develop yellow or brown nasal discharge or pain in the face, especially when you bend forward. These may be signs of sinusitis.  You develop a fever, swollen neck glands, pain with swallowing, or white areas in the back of your throat. These may be signs of strep throat. SEEK IMMEDIATE MEDICAL CARE IF:   You have a fever.  You develop severe or persistent headache, ear pain, sinus pain, or chest pain.  You develop wheezing, a prolonged cough, cough up blood, or have a change in your usual mucus (if you have chronic lung disease).  You develop sore muscles or a stiff neck. Document Released: 10/23/2000 Document Revised: 07/22/2011 Document Reviewed: 08/04/2013 [employer] Patient Information 2015 [employer]. This information is not intended to replace advice given to you by your health care provider. Make sure you discuss any questions you have with your health care provider.  

## 2014-07-23 ENCOUNTER — Emergency Department (HOSPITAL_COMMUNITY)
Admission: EM | Admit: 2014-07-23 | Discharge: 2014-07-23 | Disposition: A | Discharge status: Home / Self Care | Payer: Self-pay | Attending: Emergency Medicine | Admitting: Emergency Medicine

## 2014-07-23 ENCOUNTER — Encounter (HOSPITAL_COMMUNITY): Payer: Self-pay

## 2014-07-23 DIAGNOSIS — K089 Disorder of teeth and supporting structures, unspecified: Secondary | ICD-10-CM

## 2014-07-23 DIAGNOSIS — Z79899 Other long term (current) drug therapy: Secondary | ICD-10-CM | POA: Insufficient documentation

## 2014-07-23 DIAGNOSIS — I10 Essential (primary) hypertension: Secondary | ICD-10-CM | POA: Insufficient documentation

## 2014-07-23 DIAGNOSIS — G8929 Other chronic pain: Secondary | ICD-10-CM

## 2014-07-23 DIAGNOSIS — Z720 Tobacco use: Secondary | ICD-10-CM | POA: Insufficient documentation

## 2014-07-23 DIAGNOSIS — K029 Dental caries, unspecified: Secondary | ICD-10-CM

## 2014-07-23 MED ORDER — NAPROXEN 500 MG PO TABS: 500.0000 mg | ORAL_TABLET | Freq: Two times a day (BID) | ORAL | Status: DC | PRN

## 2014-07-23 MED ORDER — OXYCODONE-ACETAMINOPHEN 5-325 MG PO TABS: 1.0000 | ORAL_TABLET | Freq: Four times a day (QID) | ORAL | Status: DC | PRN

## 2014-07-23 MED ORDER — OXYCODONE-ACETAMINOPHEN 5-325 MG PO TABS
1.0000 | ORAL_TABLET | Freq: Once | ORAL | Status: AC
  Administered 2014-07-23: 1 via ORAL
  Filled 2014-07-23: qty 1

## 2014-07-23 NOTE — Discharge Instructions (Signed)
Apply warm compresses to jaw throughout the day. Take naprosyn and percocet as directed, as needed for pain but do not drive or operate machinery with pain medication use. Followup with a dentist is very important for ongoing evaluation and management of recurrent dental pain. Return to emergency department for emergent changing or worsening symptoms.    Dental Caries Dental caries (also called tooth decay) is the most common oral disease. It can occur at any age but is more common in children and young adults.  HOW DENTAL CARIES DEVELOPS  The process of decay begins when bacteria and foods (particularly sugars and starches) combine in your mouth to produce plaque. Plaque is a substance that sticks to the hard, outer surface of a tooth (enamel). The bacteria in plaque produce acids that attack enamel. These acids may also attack the root surface of a tooth (cementum) if it is exposed. Repeated attacks dissolve these surfaces and create holes in the tooth (cavities). If left untreated, the acids destroy the other layers of the tooth.  RISK FACTORS  Frequent sipping of sugary beverages.   Frequent snacking on sugary and starchy foods, especially those that easily get stuck in the teeth.   Poor oral hygiene.   Dry mouth.   Substance abuse such as methamphetamine abuse.   Broken or poor-fitting dental restorations.   Eating disorders.   Gastroesophageal reflux disease (GERD).   Certain radiation treatments to the head and neck. SYMPTOMS In the early stages of dental caries, symptoms are seldom present. Sometimes white, chalky areas may be seen on the enamel or other tooth layers. In later stages, symptoms may include:  Pits and holes on the enamel.  Toothache after sweet, hot, or cold foods or drinks are consumed.  Pain around the tooth.  Swelling around the tooth. DIAGNOSIS  Most of the time, dental caries is detected during a regular dental checkup. A diagnosis is made  after a thorough medical and dental history is taken and the surfaces of your teeth are checked for signs of dental caries. Sometimes special instruments, such as lasers, are used to check for dental caries. Dental X-ray exams may be taken so that areas not visible to the eye (such as between the contact areas of the teeth) can be checked for cavities.  TREATMENT  If dental caries is in its early stages, it may be reversed with a fluoride treatment or an application of a remineralizing agent at the dental office. Thorough brushing and flossing at home is needed to aid these treatments. If it is in its later stages, treatment depends on the location and extent of tooth destruction:   If a small area of the tooth has been destroyed, the destroyed area will be removed and cavities will be filled with a material such as gold, silver amalgam, or composite resin.   If a large area of the tooth has been destroyed, the destroyed area will be removed and a cap (crown) will be fitted over the remaining tooth structure.   If the center part of the tooth (pulp) is affected, a procedure called a root canal will be needed before a filling or crown can be placed.   If most of the tooth has been destroyed, the tooth may need to be pulled (extracted). HOME CARE INSTRUCTIONS You can prevent, stop, or reverse dental caries at home by practicing good oral hygiene. Good oral hygiene includes:  Thoroughly cleaning your teeth at least twice a day with a toothbrush and dental floss.  Using a fluoride toothpaste. A fluoride mouth rinse may also be used if recommended by your dentist or health care provider.   Restricting the amount of sugary and starchy foods and sugary liquids you consume.   Avoiding frequent snacking on these foods and sipping of these liquids.   Keeping regular visits with a dentist for checkups and cleanings. PREVENTION   Practice good oral hygiene.  Consider a dental sealant. A dental  sealant is a coating material that is applied by your dentist to the pits and grooves of teeth. The sealant prevents food from being trapped in them. It may protect the teeth for several years.  Ask about fluoride supplements if you live in a community without fluorinated water or with water that has a low fluoride content. Use fluoride supplements as directed by your dentist or health care provider.  Allow fluoride varnish applications to teeth if directed by your dentist or health care provider. Document Released: 01/19/2002 Document Revised: 09/13/2013 Document Reviewed: 05/01/2012 [facility] Patient Information 2015 [place], [place]. This information is not intended to replace advice given to you by your health care provider. Make sure you discuss any questions you have with your health care provider.  Dental Pain Toothache is pain in or around a tooth. It may get worse with chewing or with cold or heat.  HOME CARE  Your dentist may use a numbing medicine during treatment. If so, you may need to avoid eating until the medicine wears off. Ask your dentist about this.  Only take medicine as told by your dentist or doctor.  Avoid chewing food near the painful tooth until after all treatment is done. Ask your dentist about this. GET HELP RIGHT AWAY IF:   The problem gets worse or new problems appear.  You have a fever.  There is redness and puffiness (swelling) of the face, jaw, or neck.  You cannot open your mouth.  There is pain in the jaw.  There is very bad pain that is not helped by medicine. MAKE SURE YOU:   Understand these instructions.  Will watch your condition.  Will get help right away if you are not doing well or get worse. Document Released: 10/16/2007 Document Revised: 07/22/2011 Document Reviewed: 10/16/2007 [facility] Patient Information 2015 [place], [place]. This information is not intended to replace advice given to you by your health care provider. Make sure you  discuss any questions you have with your health care provider.  Dental Extraction A dental extraction procedure refers to a routine tooth extraction performed by your dentist. The procedure depends on where and how the tooth is positioned. The procedure can be very quick, sometimes lasting only seconds. Reasons for dental extraction include:  Tooth decay.  Infections (abscesses).  The need to make room for other teeth.  Gum diseases where the supporting bone has been destroyed.  Fractures of the tooth leaving it unrestorable.  Extra teeth (supernumerary) or grossly malformed teeth.  Baby teeththat have not fallen out in time and have not permitted the the permanent teeth to erupt properly.  In preparation for braces where there is not enough room to align the teeth properly.  Not enough room for wisdom teeth (particularly those that are impacted).  Prior to receiving radiation to the head and neck,teeth in the field of radiation may need to be extracted. LET YOUR CAREGIVER KNOW ABOUT:  Any allergies.  All medicines you are taking:  Including herbs, eye drops, over-the-counter medications, and creams.  Blood thinners (anticoagulants), aspirin,  or other drugs that may affect blood clotting.  Use of steroids (through mouth or as creams).  Previous problems with anesthetics, including local anesthetics.  History of bleeding or blood problems.  Previous surgery.  Possibility of pregnancy if this applies.  Smoking history.  Any health problems. RISKS AND COMPLICATIONS As with any procedure, complications may occur, but they can usually be managed by your caregiver. General surgical complications may include:  Reaction to anesthesia.  Damage to surrounding teeth, nerves, tissues, or structures.  Infection.  Bleeding. With appropriate treatment and care after surgery, the following complications are very uncommon:  Dry socket (blood clot does not form or stay in  place over empty socket). This can delay healing.  Incomplete extraction of roots.  Jawbone injury, pain, or weakness. BEFORE THE PROCEDURE  Your dental care provider will:  Take a medical and dental history.  Take an X-ray to evaluate the circumstances and how to best extract the tooth.  Do an oral exam.  Depending on the situation, antibiotics may be given before or after the extraction.  Your caregivers may review the procedure, the local anesthesia and/or sedation being used, and what to expect after the procedure with you.  If needed, your dentist may give you a form of sedation, either by medicine you swallow, gas, or intravenously (IV). This will help to relieve anxiety. Complicated extractions may require the use of general anesthesia. It is important to follow your caregiver's instructions prior to your procedure to avoid complications. Steps before your procedure may include:  Alert your caregiver if you feel ill (sore throat, fever, upset stomach, etc.) in the days leading up to your procedure.  Stop taking certain medications for several days prior to your procedure such as blood thinners.  Take certain medications, such as antibiotics.  Avoid eating and drinking for several hours before the procedure. This will help you to avoid complications from the sedation or anesthesia.  Sign a patient consent form.  Have a friend or family member drive you to the dentist and drive you home after the procedure.  Wear comfortable, loose clothing. Limit makeup and jewelry.  Quit smoking. If you are a smoker, this will raise the chances of a healing problem after your procedure. If you are thinking about quitting, talk to your surgeon about how long before the operation you should stop smoking. You may also get help from your primary caregiver. PROCEDURE Dental extraction is typically done as an outpatient procedure. IV sedation, local anesthesia, or both may be used. It will  keep you comfortable and free of pain during the procedure.  There are 2 types of extractions:  Simple extraction involves a tooth that is visible in the mouth and above the gum line. After local anesthetic is given by injection, and the area is numbed, the dentist will loosen the tooth with a special instrument (elevator). Then another instrument (forceps) will be used to grasp the tooth and remove it from its socket. During the procedure you will feel some pressure, but you should not feel pain. If you do feel pain, tell your dentist. The open socket will be cleaned. Dressings (gauze) will be placed in the socket to reduce bleeding.  Surgical extractions are used if the tooth has not come into the mouth or the tooth is broken off below the gum line. The dentist will make a cut (incision) in the gum and may have to remove some of the bone around the tooth to aid in the  removal of the tooth. After removal, stitches (sutures) may be required to close the area to help in healing and control bleeding. For some surgical extractions, you may need a general anesthetic or IV sedation (through the vein). After both types of extractions, you may be given pain medication or other drugs to help healing. Other postoperative instructions will be given by your dental caregiver. AFTER THE PROCEDURE  You will have gauze in your mouth where the tooth was removed. Gentle pressure on the gauze for up to 1 hour will help to control bleeding.  A blood clot will begin to form over the open socket. This is normal. Do not touch the area or rinse it.  Your pain will be controlled with medication and self-care.  You will be given detailed instructions for care after surgery. PROGNOSIS While some discomfort is normal after tooth extraction, most patients recover fully in just a few days. SEEK IMMEDIATE DENTAL CARE  You have uncontrolled bleeding, marked swelling, or severe pain.  You develop a fever, difficulty  swallowing, or other severe symptoms.  You have questions or concerns. Document Released: 04/29/2005 Document Revised: 09/13/2013 Document Reviewed: 08/03/2010 [facility] Patient Information 2015 [place], [place]. This information is not intended to replace advice given to you by your health care provider. Make sure you discuss any questions you have with your health care provider.

## 2014-07-23 NOTE — ED Provider Notes (Signed)
CSN: [id number]     Arrival date & time 07/23/14  2059 History   None   This chart was scribed for non-physician practitioner, Zacarias Pontes, PA-C working with Alfonzo Beers, MD by Terressa Koyanagi, ED Scribe. This patient was seen in room TR06C/TR06C and the patient's care was started at 9:34 PM.  Chief Complaint  Patient presents with  . Dental Pain   Patient is a 24 y.o. female presenting with tooth pain. The history is provided by the patient. No language interpreter was used.  Dental Pain Location:  Lower Lower teeth location:  17/LL 3rd molar Quality:  Sharp Severity:  Severe Onset quality:  Sudden Duration:  4 months Timing:  Constant Progression:  Worsening Chronicity:  New Context: dental caries   Context comment:  Hole in tooth Previous work-up:  Dental exam Relieved by:  Nothing (minimal relief (less than an hour) with tylenol with codeine) Worsened by:  Hot food/drink, touching, jaw movement and cold food/drink Ineffective treatments: vicodin. Associated symptoms: facial pain   Associated symptoms: no difficulty swallowing, no facial swelling, no fever, no gum swelling, no neck pain, no oral lesions and no trismus   Risk factors: smoking   Risk factors: no diabetes and no immunosuppression    PCP: Kennith Maes, DO HPI Comments: Sara Taylor is a 24 y.o. female, with PMH noted below including daily tobacco use and HTN, who presents to the Emergency Department complaining of left, lower molar pain onset 3-4 months ago. Pt describes her pain as a sharp pain and rates it a 10 out of 10. Pt notes drinking, chewing, air exposure to air makes the pain worse. Pt also complains of associated subjective warmth to left side of the face. Pt reports taking tylenol with codeine at home with minimal relief. Pt reports she has also tried Tramadol and Hydrocodone without relief. Pt reports she is on a waitlist to get an appointment with oral surgery; pt has an appointment with her  dentist on 08/02/14. Pt denies Hx of DM, Hx of HIV, chest pain, SOB, n/v, difficulty swallowing, neck pain, URI symptoms, or trismus.    Past Medical History  Diagnosis Date  . Hypertension    History reviewed. No pertinent past surgical history. Family History  Problem Relation Age of Onset  . Hypertension Maternal Grandmother   . Hypertension Maternal Grandfather   . Hypertension Paternal Grandmother   . Diabetes Paternal Grandmother    History  Substance Use Topics  . Smoking status: Current Every Day Smoker -- 0.20 packs/day    Types: Cigarettes  . Smokeless tobacco: Not on file  . Alcohol Use: Yes     Comment: less than monthly   OB History    No data available     Review of Systems  Constitutional: Negative for fever and chills.  HENT: Positive for dental problem (left lower molar pain). Negative for ear discharge, facial swelling, mouth sores, rhinorrhea and trouble swallowing.   Respiratory: Negative for shortness of breath.   Cardiovascular: Negative for chest pain.  Gastrointestinal: Negative for nausea, vomiting and abdominal pain.  Musculoskeletal: Negative for neck pain and neck stiffness.  Allergic/Immunologic: Negative for immunocompromised state.  Psychiatric/Behavioral: Negative for confusion.    A complete 10 system review of systems was obtained and all systems are negative except as noted in the HPI and PMH.    Allergies  Doxycycline  Home Medications   Prior to Admission medications   Medication Sig Start Date End Date Taking? Authorizing Provider  albuterol (PROVENTIL HFA;VENTOLIN HFA) 108 (90 BASE) MCG/ACT inhaler Inhale 1-2 puffs into the lungs every 6 (six) hours as needed for wheezing or shortness of breath. 07/11/14   Ripley Fraise, MD  hydrochlorothiazide (HYDRODIURIL) 25 MG tablet Take 1 tablet (25 mg total) by mouth daily. 03/02/14   Coral Spikes, DO   Triage Vitals: BP 159/54 mmHg  Pulse 92  Temp(Src) 98.3 F (36.8 C) (Oral)  Resp  18  Ht _0  (1.651 m)  Wt 333 lb 1 oz (151.076 kg)  BMI 55.42 kg/m2  SpO2 99%  LMP 07/07/2014 Physical Exam  Constitutional: She is oriented to person, place, and time. Vital signs are normal. She appears well-developed and well-nourished.  Non-toxic appearance. No distress.  Afebrile, nontoxic, NAD  HENT:  Head: Normocephalic and atraumatic.  Mouth/Throat: Oropharynx is clear and moist and mucous membranes are normal. No trismus in the jaw. Abnormal dentition. Dental caries present. No dental abscesses.  L lower molar #17 with caries defect to surface, no drainage, no surrounding erythema or swelling, nonTTP Diffuse dental decay No mouth subfloor induration or tenderness  Eyes: Conjunctivae and EOM are normal. Right eye exhibits no discharge. Left eye exhibits no discharge.  Neck: Normal range of motion. Neck supple.  Cardiovascular: Normal rate.   Pulmonary/Chest: Effort normal. No respiratory distress.  Abdominal: Normal appearance. She exhibits no distension.  Musculoskeletal: Normal range of motion.  Neurological: She is alert and oriented to person, place, and time. She has normal strength. No sensory deficit.  Skin: Skin is warm, dry and intact. No rash noted.  Psychiatric: She has a normal mood and affect. Her behavior is normal.  Nursing note and vitals reviewed.   ED Course  Procedures (including critical care time) DIAGNOSTIC STUDIES: Oxygen Saturation is 99% on RA, nl by my interpretation.    COORDINATION OF CARE: 9:38 PM-Discussed treatment plan which includes pain meds with pt at bedside and pt agreed to plan. Pt reports she has a ride home.    Labs Review Labs Reviewed - No data to display  Imaging Review No results found.   EKG Interpretation None      MDM   Final diagnoses:  Dental decay  Chronic dental pain    24 y.o. female with chronic Dental pain without associated abscess x4 mos, with patient afebrile, non toxic appearing and swallowing  secretions well. No concern for PTA or ludwig's. Has dentist appt but hasn't met with surgeon for extraction and stressed the importance of dental follow up for ultimate management of dental pain.  I have also discussed reasons to return immediately to the ER.  Patient expresses understanding and agrees with plan.  I will also give pain control but doubt need for abx today since no s/sx of infection and no molar TTP.    I personally performed the services described in this documentation, which was scribed in my presence. The recorded information has been reviewed and is accurate.  BP 159/54 mmHg  Pulse 92  Temp(Src) 98.3 F (36.8 C) (Oral)  Resp 18  Ht _1  (1.651 m)  Wt 333 lb 1 oz (151.076 kg)  BMI 55.42 kg/m2  SpO2 99%  LMP 07/07/2014  Meds ordered this encounter  Medications  . oxyCODONE-acetaminophen (PERCOCET/ROXICET) 5-325 MG per tablet 1 tablet    Sig:   . oxyCODONE-acetaminophen (PERCOCET) 5-325 MG per tablet    Sig: Take 1 tablet by mouth every 6 (six) hours as needed for severe pain.    Dispense:  10 tablet    Refill:  0    Order Specific Question:  Supervising Provider    Answer:  MILLER, BRIAN [[id number]]  . naproxen (NAPROSYN) 500 MG tablet    Sig: Take 1 tablet (500 mg total) by mouth 2 (two) times daily as needed for mild pain, moderate pain or headache (TAKE WITH MEALS.).    Dispense:  20 tablet    Refill:  0    Order Specific Question:  Supervising Provider    Answer:  Noemi Chapel [[id number]]     Dekari Bures Camprubi-Soms, PA-C 07/23/14 [place], MD 07/23/14 2211

## 2014-07-23 NOTE — ED Notes (Signed)
Onset 3-4 months left lower molar pain, hold in tooth.  Is on waiting list to see surgeon.  Pt has Tylenol w/codeine for pain but ineffective.

## 2014-07-24 ENCOUNTER — Emergency Department (HOSPITAL_COMMUNITY)
Admission: EM | Admit: 2014-07-24 | Discharge: 2014-07-24 | Disposition: A | Discharge status: Home / Self Care | Payer: Self-pay | Attending: Emergency Medicine | Admitting: Emergency Medicine

## 2014-07-24 ENCOUNTER — Encounter (HOSPITAL_COMMUNITY): Payer: Self-pay

## 2014-07-24 DIAGNOSIS — Z720 Tobacco use: Secondary | ICD-10-CM | POA: Insufficient documentation

## 2014-07-24 DIAGNOSIS — K088 Other specified disorders of teeth and supporting structures: Secondary | ICD-10-CM | POA: Insufficient documentation

## 2014-07-24 DIAGNOSIS — I10 Essential (primary) hypertension: Secondary | ICD-10-CM | POA: Insufficient documentation

## 2014-07-24 DIAGNOSIS — Z79899 Other long term (current) drug therapy: Secondary | ICD-10-CM | POA: Insufficient documentation

## 2014-07-24 DIAGNOSIS — K029 Dental caries, unspecified: Secondary | ICD-10-CM | POA: Insufficient documentation

## 2014-07-24 DIAGNOSIS — K0889 Other specified disorders of teeth and supporting structures: Secondary | ICD-10-CM

## 2014-07-24 MED ORDER — AMOXICILLIN 500 MG PO CAPS: 500.0000 mg | ORAL_CAPSULE | Freq: Three times a day (TID) | ORAL | Status: DC

## 2014-07-24 MED ORDER — OXYCODONE-ACETAMINOPHEN 10-325 MG PO TABS: 1.0000 | ORAL_TABLET | ORAL | Status: DC | PRN

## 2014-07-24 NOTE — Discharge Instructions (Signed)
You have been diagnosed with Dental pain. Please call the follow up dentist first thing in the morning on Monday for a follow up appointment. Keep your discharge paperwork from today's visit to bring to the dentist office. You may also use the resource guide listed below to help you find a dentist if you do not already have one to followup with. It is very important that you get evaluated by a dentist as soon as possible.  Use your pain medication as prescribed and do not operate heavy machinery while on pain medication. Note that your pain medication contains acetaminophen (Tylenol) & its is not reccommended that you use additional acetaminophen (Tylenol) while taking this medication. Take your full course of antibiotics. Read the instructions below. ° °Eat a soft or liquid diet and rinse your mouth out after meals with warm water. You should see a dentist or return here at once if you have increased swelling, increased pain or uncontrolled bleeding from the site of your injury. ° ° °SEEK MEDICAL CARE IF:  °· You have increased pain not controlled with medicines.  °· You have swelling around your tooth, in your face or neck.  °· You have bleeding which starts, continues, or gets worse.  °· You have a fever >101 °· If you are unable to open your mouth °Soft Diet  °The soft diet may be recommended after you were put on a full liquid diet. A normal diet may follow. The soft diet can also be used after surgery if you are too ill to keep down a normal diet. The soft diet may also be needed if you have a hard time chewing foods.  °DESCRIPTION  °Tender foods are used. Foods do not need to be ground or pureed. Most raw fruits and vegetables and coarse breads and cereals should be avoided. Fried foods and highly seasoned foods may cause discomfort.  °NUTRITIONAL ADEQUACY  °A healthy diet is possible if foods from each of the basic food groups are eaten daily.  °SOFT DIET FOOD LISTS  °Milk/Dairy  °Allowed: Milk and milk  drinks, milk shakes, cream cheese, cottage cheese, mild cheeses.  °Avoid: Sharp or highly seasoned cheese. °Meat/Meat Substitutes  °Allowed: Broiled, roasted, baked, or stewed tender lean beef, mutton, lamb, veal, chicken, turkey, liver, ham, crisp bacon, white fish, tuna, salmon. Eggs, smooth peanut butter.  °Avoid: All fried meats, fish, or fowl. Rich gravies and sauces. Lunch meats, sausages, hot dogs. Meats with gristle, chunky peanut butter. °Breads/Grains  °Allowed: Rice, noodles, spaghetti, macaroni. Dry or cooked refined cereals, such as farina, cream of wheat, oatmeal, grits, whole-wheat cereals. Plain or toasted white or wheat blend or whole-grain breads, soda crackers or saltines, flour tortillas.  °Avoid: Wild rice, coarse cereals, such as bran. Seed in or on breads and crackers. Bread or bread products with nuts or seeds. °Fruits/Vegetables  °Allowed: Fruit and vegetable juices, well-cooked or canned fruits and vegetables, any dried fruit. One citrus fruit daily, 1 vitamin A source daily. Well-ripened, easy to chew fruits, sweet potatoes. Baked, boiled, mashed, creamed, scalloped, or au gratin potatoes. Broths or creamed soups made with allowed vegetables, strained tomatoes.  °Avoid: All gas-forming vegetables (corn, radishes, Brussels sprouts, onions, broccoli, cabbage, parsnips, turnips, chili peppers, pinto beans, split peas, dried beans). Fruits containing seeds and skin. Potato chips and corn chips. All others that are not made with allowed vegetables. Highly seasoned soups. °Desserts/Sweets  °Allowed: Simple desserts, such as custard, junkets, gelatin desserts, plain ice cream and sherbets, simple cakes   and cookies, allowed fruits, sugar, syrup, jelly, honey, plain hard candy, and molasses.  °Avoid: Rich pastries, any dessert containing dates, nuts, raisins, or coconut. Fried pastries, such as doughnuts. Chocolate. °Beverages  °Allowed: Fruit and vegetable juices. Caffeine-free carbonated drinks,  coffee, and tea.  °Avoid: Caffeinated beverages: coffee, tea, soda or pop. °Miscellaneous  °Allowed: Butter, cream, margarine, mayonnaise, oil. Cream sauces, salt, and mild spices.  °Avoid: Highly spiced salad dressings. Highly seasoned foods, hot sauce, mustard, horseradish, and pepper. °SAMPLE MENU  °Breakfast  °Orange juice.  °Oatmeal.  °Soft cooked egg.  °Toast and margarine.  °2% milk.  °Coffee. °Lunch  °Meatloaf.  °Mashed potato.  °Green beans.  °Lemon pudding.  °Bread and margarine.  °Coffee. °Dinner  °Consommé or apricot nectar.  °Chicken breast.  °Rice, peas, and carrots.  °Applesauce.  °Bread and margarine.  °2% milk. °To cut the amount of fat in your diet, omit margarine and use 1% or skim milk.  °NUTRIENT ANALYSIS  °Calories........................1953 Kcal.  °Protein.........................102 gm.  °Carbohydrate...............247 gm.  °Fat................................65 gm.  °Cholesterol...................449 mg.  °Dietary fiber.................19 gm.  °Vitamin A.....................2944 RE.  °Vitamin C.....................79 mg.  °Niacin..........................25 mg.  °Riboflavin....................2.0 mg.  °Thiamin.......................1.5 mg.  °Folate..........................249 mcg.  °Calcium.......................1030 mg.  °Phosphorus.................1782 mg.  °Zinc..............................12 mg.  °Iron..............................13 mg.  °Sodium.........................299 mg.  °Potassium....................3046 mg. °Document Released: 08/06/2007 Document Revised: 07/22/2011 Document Reviewed: 08/06/2007  °ExitCare® Patient Information ©2014 [employer].  ° °RESOURCE GUIDE ° ° °Dental Problems ° °Dr. Janna Civilis °$200 dollar visit °[address] °[place], [place] [address]  °[phone] °  ° °Patients with Medicaid: °[facility]                     [facility] °[address]                                           [address] °Phone:   [phone]                                                  Phone:  [phone] ° °If unable to pay or uninsured, contact:  [facility] or [facility] to become qualified for the adult dental clinic. ° °Chronic Pain Problems °Contact [facility]  [phone] °Patients need to be referred by their primary care doctor. ° °Insufficient Money for Medicine °Contact [employer]:  call "211" or [facility] 271-5999. ° °No Primary Care Doctor °Call [facility]  832-8000 °Other agencies that provide inexpensive medical care °   [facility]  [phone] °   [facility]  [phone] °   [facility]  [phone] °   [facility]  [phone] °   [facility]  [phone] °   [facility]  [phone] ° °Psychological Services °[facility]  [phone] °[facility]  [phone] °[facility]   [phone] (emergency services 641-4993) ° °Substance Abuse Resources °[facility]  336-882-2125 °[facility] 336-784-9470 °[facility] 336-285-9073 °[facility] 336-845-3988 °Residential & Outpatient Substance Abuse Program  800-659-3381 ° °Abuse/Neglect °[place] [facility] Hot[facility] (336) 641-3795 °[place] [facility] Hotline 800-378-5315 (After Hours) ° °Emergency Shelter °[place]   [facility] (336) 271-5985 ° °Maternity Homes °[facility] (336) 275-9566 °[facility] (704) 372-4663 ° °MRSA Hotline #:   832-7006 ° ° ° °[place] [facility] ° °[facility] [facility]     [employer]                          [facility] °[address] [place]                       [address]      [address]  °[place]                                                [place]                            [place] °Phone:  349-3220                                   Phone:  [phone]                 Phone:   [phone] ° °[facility] °Phone:  342-8316 ° °[place] Child Abuse Hotline °(336) 342-1394 °(336) 342-3537 (After Hours) ° ° ° ° ° ° °

## 2014-07-24 NOTE — ED Provider Notes (Signed)
CSN: [id number]     Arrival date & time 07/24/14  1601 History  This chart was scribed for non-physician practitioner, Arthor CaptainAbigail Ireoluwa Grant, PA-C,working with Arby BarretteMarcy Pfeiffer, MD, by Karle PlumberJennifer Tensley, ED Scribe. This patient was seen in room WTR6/WTR6 and the patient's care was started at [facility]5:02 PM.  Chief Complaint  Patient presents with  . Dental Pain   Patient is a 24 y.o. female presenting with tooth pain. The history is provided by the patient and medical records. No language interpreter was used.  Dental Pain Associated symptoms: no facial swelling and no fever     HPI Comments:  [name] Mccook is a 24 y.o. obese female who presents to the Emergency Department complaining of severe left lower dental pain that began approximately three months ago. She reports [facility] and was treated with Percocet and Naproxen that she states is not working. She states it wears off within a couple of hours and has not taken anything for pain today. She denies modifying factors. Denies fever, chills, SOB, difficulty swallowing, facial swelling, nausea or vomiting. Pt states she has dentist appt in 9 days (08/02/14).  Past Medical History  Diagnosis Date  . Hypertension    History reviewed. No pertinent past surgical history. Family History  Problem Relation Age of Onset  . Hypertension Maternal Grandmother   . Hypertension Maternal Grandfather   . Hypertension Paternal Grandmother   . Diabetes Paternal Grandmother    History  Substance Use Topics  . Smoking status: Current Every Day Smoker -- 0.20 packs/day    Types: Cigarettes  . Smokeless tobacco: Not on file  . Alcohol Use: Yes     Comment: less than monthly   OB History    No data available     Review of Systems  Constitutional: Negative for fever and chills.  HENT: Positive for dental problem. Negative for facial swelling and trouble swallowing.   Gastrointestinal: Negative for nausea and vomiting.    Allergies   Doxycycline  Home Medications   Prior to Admission medications   Medication Sig Start Date End Date Taking? Authorizing Provider  albuterol (PROVENTIL HFA;VENTOLIN HFA) 108 (90 BASE) MCG/ACT inhaler Inhale 1-2 puffs into the lungs every 6 (six) hours as needed for wheezing or shortness of breath. 07/11/14   Zadie Rhineonald Wickline, MD  hydrochlorothiazide (HYDRODIURIL) 25 MG tablet Take 1 tablet (25 mg total) by mouth daily. 03/02/14   Tommie SamsJayce G Cook, DO  naproxen (NAPROSYN) 500 MG tablet Take 1 tablet (500 mg total) by mouth 2 (two) times daily as needed for mild pain, moderate pain or headache (TAKE WITH MEALS.). 07/23/14   Mercedes Camprubi-Soms, PA-C  oxyCODONE-acetaminophen (PERCOCET) 5-325 MG per tablet Take 1 tablet by mouth every 6 (six) hours as needed for severe pain. 07/23/14   Mercedes Camprubi-Soms, PA-C   Triage Vitals: BP 147/89 mmHg  Pulse 73  Temp(Src) 98.2 F (36.8 C) (Oral)  Resp 20  SpO2 98%  LMP 07/07/2014 Physical Exam  Constitutional: She is oriented to person, place, and time. She appears well-developed and well-nourished.  HENT:  Head: Normocephalic and atraumatic.  Mouth/Throat: Uvula is midline, oropharynx is clear and moist and mucous membranes are normal.  3rd molar on left lower side with large missing portion with dental carry and exposed pulp. Gums nontender.  Eyes: EOM are normal.  Neck: Normal range of motion.  Cardiovascular: Normal rate.   Pulmonary/Chest: Effort normal.  Musculoskeletal: Normal range of motion.  Neurological: She is alert and oriented  to person, place, and time.  Skin: Skin is warm and dry.  Psychiatric: She has a normal mood and affect. Her behavior is normal.  Nursing note and vitals reviewed.   ED Course  Procedures (including critical care time) DIAGNOSTIC STUDIES: Oxygen Saturation is 98% on RA, normal by my interpretation.   COORDINATION OF CARE: 5:06 PM- Will change dose of the pain medication. Pt verbalizes understanding and  agrees to plan.  Medications - No data to display  Labs Review Labs Reviewed - No data to display  Imaging Review No results found.   EKG Interpretation None      MDM   Final diagnoses:  Dentalgia    Patient with dental pain. Uncontrolled at home with pain medications given yesterday. We will change her dose to Percocet 10. Patient requesting antibiotics. Will discharge with amoxicillin. Follow-up with dentist. No signs of Ludwig angina. Safe for discharge at this time  I personally performed the services described in this documentation, which was scribed in my presence. The recorded information has been reviewed and is accurate.    Arthor Captain, PA-C 07/24/14 1734  Arby Barrette, MD 07/28/14 1235

## 2014-07-24 NOTE — ED Notes (Signed)
Patient c/o left lower tooth pain x 3 months. [facility]. Pain meds prescribed are not helping. Patient states she has been having a fever at home. Patient denies any gum, facial swelling. Patient states she has pain that radiates into her head and down the left lateral neck.

## 2014-08-04 ENCOUNTER — Ambulatory Visit: Payer: Self-pay

## 2014-08-05 ENCOUNTER — Encounter (HOSPITAL_COMMUNITY): Payer: Self-pay | Admitting: Physical Medicine and Rehabilitation

## 2014-08-05 ENCOUNTER — Emergency Department (HOSPITAL_COMMUNITY)
Admission: EM | Admit: 2014-08-05 | Discharge: 2014-08-05 | Disposition: A | Discharge status: Home / Self Care | Payer: Self-pay | Attending: Emergency Medicine | Admitting: Emergency Medicine

## 2014-08-05 DIAGNOSIS — K0889 Other specified disorders of teeth and supporting structures: Secondary | ICD-10-CM

## 2014-08-05 DIAGNOSIS — K088 Other specified disorders of teeth and supporting structures: Secondary | ICD-10-CM | POA: Insufficient documentation

## 2014-08-05 DIAGNOSIS — Z792 Long term (current) use of antibiotics: Secondary | ICD-10-CM | POA: Insufficient documentation

## 2014-08-05 DIAGNOSIS — Z79899 Other long term (current) drug therapy: Secondary | ICD-10-CM | POA: Insufficient documentation

## 2014-08-05 DIAGNOSIS — Z720 Tobacco use: Secondary | ICD-10-CM | POA: Insufficient documentation

## 2014-08-05 DIAGNOSIS — K029 Dental caries, unspecified: Secondary | ICD-10-CM | POA: Insufficient documentation

## 2014-08-05 DIAGNOSIS — I10 Essential (primary) hypertension: Secondary | ICD-10-CM | POA: Insufficient documentation

## 2014-08-05 NOTE — ED Notes (Signed)
Pt presents to department for evaluation of L lower dental pain. Ongoing x4 months. 7/10 pain upon arrival to ED. No facial swelling noted.

## 2014-08-05 NOTE — Discharge Instructions (Signed)

## 2014-08-05 NOTE — ED Provider Notes (Signed)
CSN: [id number]     Arrival date & time 08/05/14  1400 History  This chart was scribed for non-physician practitioner Marlon Pel working with Samuel Jester, DO by Carl Best, ED Scribe. This patient was seen in room TR08C/TR08C and the patient's care was started at 2:53 PM.    Chief Complaint  Patient presents with  . Dental Pain   Patient is a 24 y.o. female presenting with tooth pain. The history is provided by the patient. No language interpreter was used.  Dental Pain Associated symptoms: no facial swelling and no fever    HPI Comments: Sara Taylor is a 24 y.o. female who presents to the Emergency Department complaining of constant, gradually worsening left lower dental pain that started 4 months ago.  She was last seen in the ED for these symptoms on 07/24/2014 and was discharged with Percocet 10.  She states that the medication did alleviate her symptoms.  Cold air aggravates her pain.  She has applied a heating pad to the area with no relief to her symptoms.  She has tried orajel and Ibuprofen with no relief to her symptoms.  She has not been taking antibiotics.  She has an appointment to get her tooth pulled at [facility] on Monday.  She denies neck pain, fever, nausea, redness, swelling and vomiting as associated symptoms.    Past Medical History  Diagnosis Date  . Hypertension    History reviewed. No pertinent past surgical history. Family History  Problem Relation Age of Onset  . Hypertension Maternal Grandmother   . Hypertension Maternal Grandfather   . Hypertension Paternal Grandmother   . Diabetes Paternal Grandmother    History  Substance Use Topics  . Smoking status: Current Every Day Smoker -- 0.20 packs/day    Types: Cigarettes  . Smokeless tobacco: Not on file  . Alcohol Use: Yes   OB History    No data available     Review of Systems  Constitutional: Negative for fever.  HENT: Positive for dental problem. Negative for facial swelling.    Gastrointestinal: Negative for nausea and vomiting.  All other systems reviewed and are negative.   Allergies  Doxycycline  Home Medications   Prior to Admission medications   Medication Sig Start Date End Date Taking? Authorizing Provider  albuterol (PROVENTIL HFA;VENTOLIN HFA) 108 (90 BASE) MCG/ACT inhaler Inhale 1-2 puffs into the lungs every 6 (six) hours as needed for wheezing or shortness of breath. 07/11/14   Zadie Rhine, MD  amoxicillin (AMOXIL) 500 MG capsule Take 1 capsule (500 mg total) by mouth 3 (three) times daily. 07/24/14   Arthor Captain, PA-C  hydrochlorothiazide (HYDRODIURIL) 25 MG tablet Take 1 tablet (25 mg total) by mouth daily. 03/02/14   Tommie Sams, DO  naproxen (NAPROSYN) 500 MG tablet Take 1 tablet (500 mg total) by mouth 2 (two) times daily as needed for mild pain, moderate pain or headache (TAKE WITH MEALS.). 07/23/14   Mercedes Camprubi-Soms, PA-C  oxyCODONE-acetaminophen (PERCOCET) 10-325 MG per tablet Take 1 tablet by mouth every 4 (four) hours as needed for pain. 07/24/14   Arthor Captain, PA-C   Triage Vitals: BP 148/88 mmHg  Pulse 104  Temp(Src) 99 F (37.2 C) (Oral)  Resp 18  SpO2 99%  LMP 07/07/2014  Physical Exam  Constitutional: She is oriented to person, place, and time. She appears well-developed and well-nourished. No distress.  HENT:  Head: Normocephalic and atraumatic.  Mouth/Throat: Uvula is midline, oropharynx is clear and moist and  mucous membranes are normal. No trismus (no tongue protusion or neck tenderness) in the jaw. Normal dentition. Dental caries (Pts tooth shows no obvious abscess but moderate to severe tenderness to palpation of marked tooth) present. No uvula swelling.    Eyes: EOM are normal. Pupils are equal, round, and reactive to light.  Neck: Trachea normal, normal range of motion and full passive range of motion without pain. Neck supple.  Cardiovascular: Normal rate, regular rhythm, normal heart sounds and normal  pulses.   Pulmonary/Chest: Effort normal and breath sounds normal. No respiratory distress. Chest wall is not dull to percussion. She exhibits no tenderness, no crepitus, no edema, no deformity and no retraction.  Abdominal: Normal appearance.  Musculoskeletal: Normal range of motion.  Neurological: She is alert and oriented to person, place, and time. She has normal strength.  Skin: Skin is warm, dry and intact. She is not diaphoretic.  Psychiatric: She has a normal mood and affect. Her speech is normal and behavior is normal. Cognition and memory are normal.  Nursing note and vitals reviewed.   ED Course  Procedures (including critical care time)  DIAGNOSTIC STUDIES: Oxygen Saturation is 99% on room air, normal by my interpretation.    COORDINATION OF CARE: 2:56 PM- Will administer a dental block in the ED and the patient agreed to the treatment plan.  Labs Review Labs Reviewed - No data to display  Imaging Review No results found.   EKG Interpretation None      MDM   Final diagnoses:  Toothache    NERVE BLOCK Date/Time: 08/05/2014 Performed by: Dorthula MatasGREENE, [place] G Authorized by: Dorthula MatasGREENE, Glenroy Crossen G Consent: Verbal consent obtained. Risks and benefits: risks, benefits and alternatives were discussed Consent given by: patient Indications: pain relief Body area: face/mouth Laterality: left Needle gauge: 25 G Local anesthetic: lidocaine 2% without epinephrine Anesthetic total: 2 ml Outcome: pain improved Patient tolerance: Patient tolerated the procedure well with no immediate complications. Comments: Patient had complete relief of pain.  No emergent s/sx's present. Patent airway. No trismus.  No neck tenderness or protrusion of tongue or floor of mouth.  pt has Vicodin she has not yet gotten filled. She did not remember this until before discharged. [facility].  23 y.o.Sara Taylor's evaluation in the Emergency  Department is complete. It has been determined that no acute conditions requiring further emergency intervention are present at this time. The patient/guardian have been advised of the diagnosis and plan. We have discussed signs and symptoms that warrant return to the ED, such as changes or worsening in symptoms.  Vital signs are stable at discharge. Filed Vitals:   08/05/14 1510  BP: 137/76  Pulse: 92  Temp: 98.3 F (36.8 C)  Resp: 16    Patient/guardian has voiced understanding and agreed to follow-up with the PCP or specialist.      Marlon Peliffany Haille Pardi, PA-C 08/05/14 1516  Samuel JesterKathleen McManus, DO 08/06/14 1413

## 2014-08-17 ENCOUNTER — Ambulatory Visit: Payer: Self-pay

## 2014-10-12 ENCOUNTER — Emergency Department (HOSPITAL_COMMUNITY): Payer: Self-pay

## 2014-10-12 ENCOUNTER — Encounter (HOSPITAL_COMMUNITY): Payer: Self-pay | Admitting: *Deleted

## 2014-10-12 ENCOUNTER — Emergency Department (HOSPITAL_COMMUNITY)
Admission: EM | Admit: 2014-10-12 | Discharge: 2014-10-12 | Disposition: A | Discharge status: Home / Self Care | Payer: Self-pay | Attending: Emergency Medicine | Admitting: Emergency Medicine

## 2014-10-12 DIAGNOSIS — Z79899 Other long term (current) drug therapy: Secondary | ICD-10-CM | POA: Insufficient documentation

## 2014-10-12 DIAGNOSIS — Y280XXA Contact with sharp glass, undetermined intent, initial encounter: Secondary | ICD-10-CM | POA: Insufficient documentation

## 2014-10-12 DIAGNOSIS — Z792 Long term (current) use of antibiotics: Secondary | ICD-10-CM | POA: Insufficient documentation

## 2014-10-12 DIAGNOSIS — Y929 Unspecified place or not applicable: Secondary | ICD-10-CM | POA: Insufficient documentation

## 2014-10-12 DIAGNOSIS — Y999 Unspecified external cause status: Secondary | ICD-10-CM | POA: Insufficient documentation

## 2014-10-12 DIAGNOSIS — I10 Essential (primary) hypertension: Secondary | ICD-10-CM | POA: Insufficient documentation

## 2014-10-12 DIAGNOSIS — S51811A Laceration without foreign body of right forearm, initial encounter: Secondary | ICD-10-CM | POA: Insufficient documentation

## 2014-10-12 DIAGNOSIS — Z720 Tobacco use: Secondary | ICD-10-CM | POA: Insufficient documentation

## 2014-10-12 DIAGNOSIS — Y939 Activity, unspecified: Secondary | ICD-10-CM | POA: Insufficient documentation

## 2014-10-12 MED ORDER — HYDROCODONE-ACETAMINOPHEN 5-325 MG PO TABS: 1.0000 | ORAL_TABLET | Freq: Four times a day (QID) | ORAL | Status: DC | PRN

## 2014-10-12 MED ORDER — HYDROCODONE-ACETAMINOPHEN 5-325 MG PO TABS
1.0000 | ORAL_TABLET | Freq: Once | ORAL | Status: AC
  Administered 2014-10-12: 1 via ORAL
  Filled 2014-10-12: qty 1

## 2014-10-12 MED ORDER — NAPROXEN 500 MG PO TABS: 500.0000 mg | ORAL_TABLET | Freq: Two times a day (BID) | ORAL | Status: DC | PRN

## 2014-10-12 MED ORDER — LIDOCAINE-EPINEPHRINE (PF) 2 %-1:200000 IJ SOLN
10.0000 mL | Freq: Once | INTRAMUSCULAR | Status: AC
  Administered 2014-10-12: 10 mL
  Filled 2014-10-12: qty 20

## 2014-10-12 NOTE — Discharge Instructions (Signed)
Keep wound clean with mild soap and water. Keep area covered with a topical antibiotic ointment and bandage, keep bandage dry, and do not submerge in water for 24 hours. Ice and elevate for additional pain relief and swelling. Alternate between Naprosyn and norco for additional pain relief, but don't drive while taking norco. Follow up with your primary care doctor or the [facility] in approximately 7-10 days for wound recheck and suture removal. Monitor area for signs of infection to include, but not limited to: increasing pain, redness, drainage/pus, or swelling. Return to emergency department for emergent changing or worsening symptoms.    Laceration Care, Adult A laceration is a cut that goes through all layers of the skin. The cut goes into the tissue beneath the skin. HOME CARE For stitches (sutures) or staples:  Keep the cut clean and dry.  If you have a bandage (dressing), change it at least once a day. Change the bandage if it gets wet or dirty, or as told by your doctor.  Wash the cut with soap and water 2 times a day. Rinse the cut with water. Pat it dry with a clean towel.  Put a thin layer of medicated cream on the cut as told by your doctor.  You may shower after the first 24 hours. Do not soak the cut in water until the stitches are removed.  Only take medicines as told by your doctor.  Have your stitches or staples removed as told by your doctor. For skin adhesive strips:  Keep the cut clean and dry.  Do not get the strips wet. You may take a bath, but be careful to keep the cut dry.  If the cut gets wet, pat it dry with a clean towel.  The strips will fall off on their own. Do not remove the strips that are still stuck to the cut. For wound glue:  You may shower or take baths. Do not soak or scrub the cut. Do not swim. Avoid heavy sweating until the glue falls off on its own. After a shower or bath, pat the cut dry with a clean towel.  Do not put  medicine on your cut until the glue falls off.  If you have a bandage, do not put tape over the glue.  Avoid lots of sunlight or tanning lamps until the glue falls off. Put sunscreen on the cut for the first year to reduce your scar.  The glue will fall off on its own. Do not pick at the glue. You may need a tetanus shot if:  You cannot remember when you had your last tetanus shot.  You have never had a tetanus shot. If you need a tetanus shot and you choose not to have one, you may get tetanus. Sickness from tetanus can be serious. GET HELP RIGHT AWAY IF:   Your pain does not get better with medicine.  Your arm, hand, leg, or foot loses feeling (numbness) or changes color.  Your cut is bleeding.  Your joint feels weak, or you cannot use your joint.  You have painful lumps on your body.  Your cut is red, puffy (swollen), or painful.  You have a red line on the skin near the cut.  You have yellowish-white fluid (pus) coming from the cut.  You have a fever.  You have a bad smell coming from the cut or bandage.  Your cut breaks open before or after stitches are removed.  You notice something  coming out of the cut, such as wood or glass.  You cannot move a finger or toe. MAKE SURE YOU:   Understand these instructions.  Will watch your condition.  Will get help right away if you are not doing well or get worse. Document Released: 10/16/2007 Document Revised: 07/22/2011 Document Reviewed: 10/23/2010 [facility] Patient Information 2015 [place], [place]. This information is not intended to replace advice given to you by your health care provider. Make sure you discuss any questions you have with your health care provider.  Sutured Wound Care Sutures are stitches that can be used to close wounds. Wound care helps prevent pain and infection.  HOME CARE INSTRUCTIONS   Rest and elevate the injured area until all the pain and swelling are gone.  Only take over-the-counter or  prescription medicines for pain, discomfort, or fever as directed by your caregiver.  After 48 hours, gently wash the area with mild soap and water once a day, or as directed. Rinse off the soap. Pat the area dry with a clean towel. Do not rub the wound. This may cause bleeding.  Follow your caregiver's instructions for how often to change the bandage (dressing). Stop using a dressing after 2 days or after the wound stops draining.  If the dressing sticks, moisten it with soapy water and gently remove it.  Apply ointment on the wound as directed.  Avoid stretching a sutured wound.  Drink enough fluids to keep your urine clear or pale yellow.  Follow up with your caregiver for suture removal as directed.  Use sunscreen on your wound for the next 3 to 6 months so the scar will not darken. SEEK IMMEDIATE MEDICAL CARE IF:   Your wound becomes red, swollen, hot, or tender.  You have increasing pain in the wound.  You have a red streak that extends from the wound.  There is pus coming from the wound.  You have a fever.  You have shaking chills.  There is a bad smell coming from the wound.  You have persistent bleeding from the wound. MAKE SURE YOU:   Understand these instructions.  Will watch your condition.  Will get help right away if you are not doing well or get worse. Document Released: 06/06/2004 Document Revised: 07/22/2011 Document Reviewed: 09/02/2010 [facility] Patient Information 2015 [place], [place]. This information is not intended to replace advice given to you by your health care provider. Make sure you discuss any questions you have with your health care provider.

## 2014-10-12 NOTE — ED Provider Notes (Signed)
CSN: [id number]     Arrival date & time 10/12/14  1607 History  This chart was scribed for Levi StraussMercedes Camprubi-Soms, PA-C, working with Vanetta MuldersScott Zackowski, MD by Chestine SporeSoijett Blue, ED Scribe. The patient was seen in room TR08C/TR08C at 4:20 PM.    Chief Complaint  Patient presents with  . Arm Injury      Patient is a 24 y.o. female presenting with arm injury. The history is provided by the patient. No language interpreter was used.  Arm Injury Location:  Arm Time since incident:  30 minutes Injury: yes   Mechanism of injury comment:  Accident Arm location:  R forearm Pain details:    Quality: stinging.   Radiates to:  R forearm   Severity:  Moderate   Onset quality:  Sudden   Duration:  1 hour   Timing:  Constant   Progression:  Unchanged Chronicity:  New Foreign body present:  Unable to specify Tetanus status:  Up to date Prior injury to area:  No Relieved by: cleaning the area. Worsened by:  Nothing tried Ineffective treatments:  None tried Associated symptoms: no decreased range of motion, no muscle weakness, no numbness and no tingling      Norinne L Rolley SimsHampton is a 24 y.o. female with a medical hx of HTN who presents to the Emergency department complaining of right arm injury onset 30 minutes ago PTA. Pt reports that she was coming out the door when someone attempted to shut the door onto her and her right arm went through the glass door. Pt pain is 8/10, constant, stinging, R forearm pain at the site of the laceration, and it doesn't  radiate. Pt reports that her pain was alleviated by cleaning the area but she is unaware of what will worsen the pain. She denies dizziness, lightheadedness, numbness, tingling, weakness, CP, SOB abdominal pain, n/v, right elbow pain, and any other symptoms. Pt reports that her last tetanus shot was in 2014. Pt is allergic to doxycycline but denies allergies to any other medications. Pt takes HCTZ daily for her HTN that she did not take today. Pt denies having  a medical hx of DM. Pt PCP is Gildardo CrankerHess, Bryan, DO.   Past Medical History  Diagnosis Date  . Hypertension    History reviewed. No pertinent past surgical history. Family History  Problem Relation Age of Onset  . Hypertension Maternal Grandmother   . Hypertension Maternal Grandfather   . Hypertension Paternal Grandmother   . Diabetes Paternal Grandmother    History  Substance Use Topics  . Smoking status: Current Every Day Smoker -- 0.20 packs/day    Types: Cigarettes  . Smokeless tobacco: Not on file  . Alcohol Use: Yes   OB History    No data available     Review of Systems  Respiratory: Negative for shortness of breath.   Cardiovascular: Negative for chest pain.  Gastrointestinal: Negative for nausea, vomiting and abdominal pain.  Musculoskeletal: Positive for myalgias (right forearm, at laceration). Negative for arthralgias.  Skin: Positive for wound (multiple lacerations to the right forearm).  Allergic/Immunologic: Negative for immunocompromised state.  Neurological: Negative for dizziness, weakness, light-headedness and numbness.  Hematological: Does not bruise/bleed easily.  Psychiatric/Behavioral: Negative for self-injury.   A complete 10 system review of systems was obtained and all systems are negative except as noted in the HPI and PMH.    Allergies  Doxycycline  Home Medications   Prior to Admission medications   Medication Sig Start Date End Date Taking?  Authorizing Provider  albuterol (PROVENTIL HFA;VENTOLIN HFA) 108 (90 BASE) MCG/ACT inhaler Inhale 1-2 puffs into the lungs every 6 (six) hours as needed for wheezing or shortness of breath. 07/11/14   Zadie Rhine, MD  amoxicillin (AMOXIL) 500 MG capsule Take 1 capsule (500 mg total) by mouth 3 (three) times daily. 07/24/14   Arthor Captain, PA-C  hydrochlorothiazide (HYDRODIURIL) 25 MG tablet Take 1 tablet (25 mg total) by mouth daily. 03/02/14   Tommie Sams, DO  naproxen (NAPROSYN) 500 MG tablet Take 1  tablet (500 mg total) by mouth 2 (two) times daily as needed for mild pain, moderate pain or headache (TAKE WITH MEALS.). 07/23/14   Ananda Sitzer Camprubi-Soms, PA-C  oxyCODONE-acetaminophen (PERCOCET) 10-325 MG per tablet Take 1 tablet by mouth every 4 (four) hours as needed for pain. 07/24/14   Abigail Harris, PA-C   BP 146/103 mmHg  Pulse 98  Temp(Src) 97.5 F (36.4 C) (Oral)  Resp 22  SpO2 100% Physical Exam  Constitutional: She is oriented to person, place, and time. Vital signs are normal. She appears well-developed and well-nourished.  Non-toxic appearance. No distress.  Afebrile, nontoxic, NAD. Mild HTN noted  HENT:  Head: Normocephalic and atraumatic.  Mouth/Throat: Mucous membranes are normal.  Eyes: Conjunctivae and EOM are normal. Right eye exhibits no discharge. Left eye exhibits no discharge.  Neck: Normal range of motion. Neck supple.  Cardiovascular: Normal rate and intact distal pulses.   Pulmonary/Chest: Effort normal. No respiratory distress.  Abdominal: Normal appearance. She exhibits no distension.  Musculoskeletal: Normal range of motion.       Right elbow: She exhibits normal range of motion. No tenderness found.       Right wrist: She exhibits normal range of motion and no tenderness.       Right forearm: She exhibits laceration. She exhibits no tenderness, no bony tenderness, no swelling and no deformity.  Right forearm with no focal bony TTP, with three lacerations as pictured below, with no active hemorrhage. Right wrist and elbow with FROM and no TTP, no swelling or deformities. Sensation and strength grossly intact. Distal pulses intact.   Neurological: She is alert and oriented to person, place, and time. She has normal strength. No sensory deficit.  Skin: Skin is warm and dry. Laceration noted. No rash noted.  Three linear lacerations to the right forearm, see picture below.   Psychiatric: She has a normal mood and affect. Her behavior is normal.  Nursing note  and vitals reviewed.         ED Course  LACERATION REPAIR Date/Time: 10/12/2014 4:47 PM Performed by: Allen Derry Authorized by: Allen Derry Consent: Verbal consent obtained. Risks and benefits: risks, benefits and alternatives were discussed Consent given by: patient Patient understanding: patient states understanding of the procedure being performed Patient consent: the patient's understanding of the procedure matches consent given Patient identity confirmed: verbally with patient Body area: upper extremity Location details: right lower arm Wound length (cm): multiple, ranging from 1.5cm to 6cm. Foreign bodies: no foreign bodies Tendon involvement: none Nerve involvement: none Vascular damage: no Anesthesia: local infiltration Local anesthetic: lidocaine 2% with epinephrine Anesthetic total: 4 ml Patient sedated: no Preparation: Patient was prepped and draped in the usual sterile fashion. Irrigation solution: saline Irrigation method: syringe Amount of cleaning: extensive Debridement: none Degree of undermining: none Skin closure: 4-0 Prolene and 5-0 Prolene Number of sutures: 11 Technique: horizontal mattress and simple Approximation: close Approximation difficulty: complex Dressing: 4x4 sterile gauze and antibiotic ointment Patient  tolerance: Patient tolerated the procedure well with no immediate complications Comments: 3 lacs repaired, using 4-0 and 5-0 prolene combination, and using simple and horizontal mattress sutures combination   (including critical care time) DIAGNOSTIC STUDIES: Oxygen Saturation is 100% on RA, nl by my interpretation.    COORDINATION OF CARE: 4:24 PM-Discussed treatment plan which includes laceration repair and f/u in 10 days for suture removal with PCP with pt at bedside and pt agreed to plan.   4:47 PM- Total of 4 cc's 2% Lidocaine with epinephrine used and 11 sutures total.    Labs Review Labs Reviewed -  No data to display  Imaging Review Dg Forearm Right  10/12/2014   CLINICAL DATA:  Multiple lacerations to forearm from broken glass in door. Rule out foreign body.  EXAM: RIGHT FOREARM - 2 VIEW  COMPARISON:  None.  FINDINGS: No bony abnormality. No fracture, subluxation or dislocation. No soft tissue foreign body.  IMPRESSION: No radiopaque foreign body or acute bony abnormality.   Electronically Signed   By: Charlett Nose M.D.   On: 10/12/2014 16:40     EKG Interpretation None      MDM   Final diagnoses:  Forearm laceration, right, initial encounter  HTN (hypertension), benign    24 y.o. female here with 3 lacs to forearm, very superficial. Tetanus UTD. Neurovascularly intact. Xrays negative for retained FB or fx. Sutured closed with total of 11 sutures, combination of 4-0 and 5-0 prolene. Discussed wound care and suture removal at PCPs office in 7-10 days. Doubt need for abx. Will give pain meds. Discussed RICE therapy. Of note, pt didn't take antiHTN meds, slightly HTN here but asymptomatic, doubt need for further eval. I explained the diagnosis and have given explicit precautions to return to the ER including for any other new or worsening symptoms. The patient understands and accepts the medical plan as it's been dictated and I have answered their questions. Discharge instructions concerning home care and prescriptions have been given. The patient is STABLE and is discharged to home in good condition.   I personally performed the services described in this documentation, which was scribed in my presence. The recorded information has been reviewed and is accurate.  BP 146/103 mmHg  Pulse 98  Temp(Src) 97.5 F (36.4 C) (Oral)  Resp 22  SpO2 100%  Meds ordered this encounter  Medications  . HYDROcodone-acetaminophen (NORCO/VICODIN) 5-325 MG per tablet 1 tablet    Sig:   . lidocaine-EPINEPHrine (XYLOCAINE W/EPI) 2 %-1:200000 (PF) injection 10 mL    Sig:   . naproxen (NAPROSYN) 500  MG tablet    Sig: Take 1 tablet (500 mg total) by mouth 2 (two) times daily as needed for mild pain, moderate pain or headache (TAKE WITH MEALS.).    Dispense:  20 tablet    Refill:  0    Order Specific Question:  Supervising Provider    Answer:  MILLER, BRIAN [[id number]]  . HYDROcodone-acetaminophen (NORCO) 5-325 MG per tablet    Sig: Take 1 tablet by mouth every 6 (six) hours as needed for severe pain.    Dispense:  10 tablet    Refill:  0    Order Specific Question:  Supervising Provider    Answer:  Eber Hong [[id number]]     Arbie Reisz Camprubi-Soms, PA-C 10/12/14 1740  Vanetta Mulders, MD 10/13/14 [phone]

## 2014-10-12 NOTE — ED Notes (Signed)
Pt in c/o multiple laceration to her right forearm, states she tripped and her arm went through some glass and screen on a screen door, multiple laceration and abrasions noted, swelling noted also, last tetanus shot was 2014

## 2014-10-26 ENCOUNTER — Ambulatory Visit (INDEPENDENT_AMBULATORY_CARE_PROVIDER_SITE_OTHER): Payer: Self-pay | Admitting: *Deleted

## 2014-10-26 DIAGNOSIS — Z4802 Encounter for removal of sutures: Secondary | ICD-10-CM

## 2014-10-26 NOTE — Progress Notes (Signed)
   Pt in nurse clinic for suture removal of right forearm.  Pt had 3 lacerations  to right arm with a total of 11 sutures placed.  Areas were cleaned and 11 sutures removed without difficultly.  Pt to follow up with PCP with any other concerns.  Clovis Pu, RN

## 2014-12-13 ENCOUNTER — Emergency Department (HOSPITAL_COMMUNITY)
Admission: EM | Admit: 2014-12-13 | Discharge: 2014-12-14 | Disposition: A | Discharge status: Home / Self Care | Payer: Self-pay | Attending: Emergency Medicine | Admitting: Emergency Medicine

## 2014-12-13 ENCOUNTER — Encounter (HOSPITAL_COMMUNITY): Payer: Self-pay | Admitting: Emergency Medicine

## 2014-12-13 DIAGNOSIS — R2242 Localized swelling, mass and lump, left lower limb: Secondary | ICD-10-CM | POA: Insufficient documentation

## 2014-12-13 DIAGNOSIS — Z79899 Other long term (current) drug therapy: Secondary | ICD-10-CM | POA: Insufficient documentation

## 2014-12-13 DIAGNOSIS — M25572 Pain in left ankle and joints of left foot: Secondary | ICD-10-CM | POA: Insufficient documentation

## 2014-12-13 DIAGNOSIS — Z720 Tobacco use: Secondary | ICD-10-CM | POA: Insufficient documentation

## 2014-12-13 DIAGNOSIS — Z792 Long term (current) use of antibiotics: Secondary | ICD-10-CM | POA: Insufficient documentation

## 2014-12-13 DIAGNOSIS — I10 Essential (primary) hypertension: Secondary | ICD-10-CM | POA: Insufficient documentation

## 2014-12-13 MED ORDER — OXYCODONE-ACETAMINOPHEN 5-325 MG PO TABS
2.0000 | ORAL_TABLET | Freq: Once | ORAL | Status: AC
  Administered 2014-12-14: 2 via ORAL
  Filled 2014-12-13: qty 2

## 2014-12-13 NOTE — Discharge Instructions (Signed)
Ankle Pain Rest.Ice.Elevate. Take ibuprofen for pain. Follow up with your primary care physician. Ankle pain is a common symptom. The bones, cartilage, tendons, and muscles of the ankle joint perform a lot of work each day. The ankle joint holds your body weight and allows you to move around. Ankle pain can occur on either side or back of 1 or both ankles. Ankle pain may be sharp and burning or dull and aching. There may be tenderness, stiffness, redness, or warmth around the ankle. The pain occurs more often when a person walks or puts pressure on the ankle. CAUSES  There are many reasons ankle pain can develop. It is important to work with your caregiver to identify the cause since many conditions can impact the bones, cartilage, muscles, and tendons. Causes for ankle pain include:  Injury, including a break (fracture), sprain, or strain often due to a fall, sports, or a high-impact activity.  Swelling (inflammation) of a tendon (tendonitis).  Achilles tendon rupture.  Ankle instability after repeated sprains and strains.  Poor foot alignment.  Pressure on a nerve (tarsal tunnel syndrome).  Arthritis in the ankle or the lining of the ankle.  Crystal formation in the ankle (gout or pseudogout). DIAGNOSIS  A diagnosis is based on your medical history, your symptoms, results of your physical exam, and results of diagnostic tests. Diagnostic tests may include X-ray exams or a computerized magnetic scan (magnetic resonance imaging, MRI). TREATMENT  Treatment will depend on the cause of your ankle pain and may include:  Keeping pressure off the ankle and limiting activities.  Using crutches or other walking support (a cane or brace).  Using rest, ice, compression, and elevation.  Participating in physical therapy or home exercises.  Wearing shoe inserts or special shoes.  Losing weight.  Taking medications to reduce pain or swelling or receiving an injection.  Undergoing  surgery. HOME CARE INSTRUCTIONS   Only take over-the-counter or prescription medicines for pain, discomfort, or fever as directed by your caregiver.  Put ice on the injured area.  Put ice in a plastic bag.  Place a towel between your skin and the bag.  Leave the ice on for 15-20 minutes at a time, 03-04 times a day.  Keep your leg raised (elevated) when possible to lessen swelling.  Avoid activities that cause ankle pain.  Follow specific exercises as directed by your caregiver.  Record how often you have ankle pain, the location of the pain, and what it feels like. This information may be helpful to you and your caregiver.  Ask your caregiver about returning to work or sports and whether you should drive.  Follow up with your caregiver for further examination, therapy, or testing as directed. SEEK MEDICAL CARE IF:   Pain or swelling continues or worsens beyond 1 week.  You have an oral temperature above 102 F (38.9 C).  You are feeling unwell or have chills.  You are having an increasingly difficult time with walking.  You have loss of sensation or other new symptoms.  You have questions or concerns. MAKE SURE YOU:   Understand these instructions.  Will watch your condition.  Will get help right away if you are not doing well or get worse. Document Released: 10/17/2009 Document Revised: 07/22/2011 Document Reviewed: 10/17/2009 [facility] Patient Information 2015 [place], [place]. This information is not intended to replace advice given to you by your health care provider. Make sure you discuss any questions you have with your health care provider.

## 2014-12-13 NOTE — ED Notes (Signed)
Pt. reports left ankle pain with swelling onset this week , denies injury or fall /ambukatory .

## 2014-12-13 NOTE — ED Provider Notes (Signed)
CSN: [id number]     Arrival date & time 12/13/14  2310 History  This chart was scribed for non-physician practitioner, Catha Gosselin, PA-C working with Raeford Razor, MD by Placido Sou, ED scribe. This patient was seen in room TR07C/TR07C and the patient's care was started at 11:50 PM.   Chief Complaint  Patient presents with  . Ankle Pain   The history is provided by the patient. No language interpreter was used.    HPI Comments: Sara Taylor is a 24 y.o. female, with a hx of obesity, who presents to the Emergency Department complaining of constant, moderate, pain and swelling to her left ankle with onset 2 days ago. Pt notes working at [employer] and standing for long periods of time but denies that any recent trauma has occurred. She notes a worsening of her pain with standing or ambulating and an alleviation of her pain when resting. Pt also notes taking ibuprofen throughout the day for pain management with temporary relief. She denies any injury or fall.  Past Medical History  Diagnosis Date  . Hypertension    History reviewed. No pertinent past surgical history. Family History  Problem Relation Age of Onset  . Hypertension Maternal Grandmother   . Hypertension Maternal Grandfather   . Hypertension Paternal Grandmother   . Diabetes Paternal Grandmother    History  Substance Use Topics  . Smoking status: Current Every Day Smoker -- 0.20 packs/day    Types: Cigarettes  . Smokeless tobacco: Not on file  . Alcohol Use: Yes   OB History    No data available     Review of Systems  Constitutional: Negative for fever and chills.  Musculoskeletal: Positive for joint swelling and arthralgias.  Skin: Negative for wound.   Allergies  Doxycycline  Home Medications   Prior to Admission medications   Medication Sig Start Date End Date Taking? Authorizing Provider  albuterol (PROVENTIL HFA;VENTOLIN HFA) 108 (90 BASE) MCG/ACT inhaler Inhale 1-2 puffs into the lungs  every 6 (six) hours as needed for wheezing or shortness of breath. 07/11/14   Zadie Rhine, MD  amoxicillin (AMOXIL) 500 MG capsule Take 1 capsule (500 mg total) by mouth 3 (three) times daily. 07/24/14   Arthor Captain, PA-C  hydrochlorothiazide (HYDRODIURIL) 25 MG tablet Take 1 tablet (25 mg total) by mouth daily. 03/02/14   Tommie Sams, DO  HYDROcodone-acetaminophen (NORCO) 5-325 MG per tablet Take 1 tablet by mouth every 6 (six) hours as needed for severe pain. 10/12/14   Mercedes Camprubi-Soms, PA-C  naproxen (NAPROSYN) 500 MG tablet Take 1 tablet (500 mg total) by mouth 2 (two) times daily as needed for mild pain, moderate pain or headache (TAKE WITH MEALS.). 07/23/14   Mercedes Camprubi-Soms, PA-C  naproxen (NAPROSYN) 500 MG tablet Take 1 tablet (500 mg total) by mouth 2 (two) times daily as needed for mild pain, moderate pain or headache (TAKE WITH MEALS.). 10/12/14   Mercedes Camprubi-Soms, PA-C  oxyCODONE-acetaminophen (PERCOCET) 10-325 MG per tablet Take 1 tablet by mouth every 4 (four) hours as needed for pain. 07/24/14   Abigail Harris, PA-C   BP 144/86 mmHg  Pulse 108  Temp(Src) 98.4 F (36.9 C) (Oral)  Resp 22  Wt 354 lb (160.573 kg)  SpO2 98%  LMP 11/20/2014 Physical Exam  Constitutional: She is oriented to person, place, and time. She appears well-developed and well-nourished. No distress.  Morbidly obese  HENT:  Head: Normocephalic and atraumatic.  Eyes: Conjunctivae and EOM are normal. Pupils are  equal, round, and reactive to light.  Neck: Normal range of motion. Neck supple. No tracheal deviation present.  Cardiovascular: Normal rate.   Pulses:      Dorsalis pedis pulses are 2+ on the left side.  Pulmonary/Chest: Effort normal.  Abdominal: Soft. There is no tenderness.  Musculoskeletal: Normal range of motion.  No left ankle swelling or tenderness to palpation. No fifth metatarsal pain or tenderness to palpation. No navicular or calcaneus tenderness to palpation. Able to  flex and extend toes without difficulty. Patient is ambulatory with steady gait. 2+ DP pulse. Able to dorsi and plantar flex without difficulty. NVI  Neurological: She is alert and oriented to person, place, and time.  Skin: Skin is warm and dry.  Psychiatric: She has a normal mood and affect. Her behavior is normal.  Nursing note and vitals reviewed.   ED Course  Procedures  DIAGNOSTIC STUDIES: Oxygen Saturation is 98% on RA, normal by my interpretation.    COORDINATION OF CARE: 11:54 PM Discussed treatment plan with pt at bedside and pt agreed to plan.  Labs Review Labs Reviewed - No data to display  Imaging Review No results found.   EKG Interpretation None      MDM   Final diagnoses:  None   Patient presents for left ankle pain with standing and ambulation. She had no recent injury or fall and she is ambulatory with steady gait. I do not believe she needs imaging and reviewed the [place] ankle rules. I discussed that her pain would most likely be from obesity and standing. RICE. I also explained that she could take ibuprofen or naproxen for pain. She was placed in a ASO splint. Patient verbally agreed with the plan. Medications  oxyCODONE-acetaminophen (PERCOCET/ROXICET) 5-325 MG per tablet 2 tablet (2 tablets Oral Given 12/14/14 0030)  I personally performed the services described in this documentation, which was scribed in my presence. The recorded information has been reviewed and is accurate.    Catha Gosselin, PA-C 12/14/14 0038  Raeford Razor, MD 12/14/14 [phone]

## 2014-12-16 ENCOUNTER — Ambulatory Visit: Payer: Self-pay | Admitting: Family Medicine

## 2014-12-17 ENCOUNTER — Emergency Department (HOSPITAL_COMMUNITY)
Admission: EM | Admit: 2014-12-17 | Discharge: 2014-12-17 | Disposition: A | Discharge status: Home / Self Care | Payer: Self-pay | Attending: Emergency Medicine | Admitting: Emergency Medicine

## 2014-12-17 ENCOUNTER — Emergency Department (HOSPITAL_COMMUNITY): Payer: Self-pay

## 2014-12-17 ENCOUNTER — Encounter (HOSPITAL_COMMUNITY): Payer: Self-pay | Admitting: Emergency Medicine

## 2014-12-17 DIAGNOSIS — Z79899 Other long term (current) drug therapy: Secondary | ICD-10-CM | POA: Insufficient documentation

## 2014-12-17 DIAGNOSIS — Z792 Long term (current) use of antibiotics: Secondary | ICD-10-CM | POA: Insufficient documentation

## 2014-12-17 DIAGNOSIS — I10 Essential (primary) hypertension: Secondary | ICD-10-CM | POA: Insufficient documentation

## 2014-12-17 DIAGNOSIS — M25572 Pain in left ankle and joints of left foot: Secondary | ICD-10-CM | POA: Insufficient documentation

## 2014-12-17 DIAGNOSIS — Z720 Tobacco use: Secondary | ICD-10-CM | POA: Insufficient documentation

## 2014-12-17 MED ORDER — OXYCODONE-ACETAMINOPHEN 5-325 MG PO TABS
1.0000 | ORAL_TABLET | Freq: Once | ORAL | Status: AC
  Administered 2014-12-17: 1 via ORAL
  Filled 2014-12-17: qty 1

## 2014-12-17 MED ORDER — ACETAMINOPHEN-CODEINE #3 300-30 MG PO TABS: 1.0000 | ORAL_TABLET | Freq: Four times a day (QID) | ORAL | Status: DC | PRN

## 2014-12-17 NOTE — ED Notes (Signed)
Pt arrived to the ED with a complaint of left ankle pain.  Pt was seen four days ago at [facility] and let go.  Pt states she has followed her instructions but pain has not gotten any better.  Pt which to be reevaluated.

## 2014-12-17 NOTE — Discharge Instructions (Signed)

## 2014-12-17 NOTE — ED Provider Notes (Signed)
CSN: [id number]     Arrival date & time 12/17/14  2109 History   This chart was scribed for non-physician practitioner Fayrene Helper, PA-C, working with Richardean Canal, MD, by Andrew Au, ED Scribe. This patient was seen in room WTR8/WTR8 and the patient's care was started at 10:37 PM.  Chief Complaint  Patient presents with  . Ankle Pain   The history is provided by the patient. No language interpreter was used.    Sara Taylor is a 24 y.o. female who presents to the Emergency Department complaining of left ankle pain for 5 days worse with standing. Pt was seen at [facility] 4 days ago for the same complaint. During that visit she was advised to take naproxen as needed and given an ankle splint. States 2 days after being seen ankle pain returned. She currently describes ankle pian as sharp worse with standing. She denies past and recent injury but does note having stitches to left foot in the past.  Past Medical History  Diagnosis Date  . Hypertension    History reviewed. No pertinent past surgical history. Family History  Problem Relation Age of Onset  . Hypertension Maternal Grandmother   . Hypertension Maternal Grandfather   . Hypertension Paternal Grandmother   . Diabetes Paternal Grandmother    History  Substance Use Topics  . Smoking status: Current Every Day Smoker -- 0.20 packs/day    Types: Cigarettes  . Smokeless tobacco: Not on file  . Alcohol Use: Yes   OB History    No data available     Review of Systems  Musculoskeletal: Positive for arthralgias. Negative for gait problem.  Neurological: Negative for weakness and numbness.   Allergies  Doxycycline  Home Medications   Prior to Admission medications   Medication Sig Start Date End Date Taking? Authorizing Provider  albuterol (PROVENTIL HFA;VENTOLIN HFA) 108 (90 BASE) MCG/ACT inhaler Inhale 1-2 puffs into the lungs every 6 (six) hours as needed for wheezing or shortness of breath. 07/11/14   Zadie Rhine, MD   amoxicillin (AMOXIL) 500 MG capsule Take 1 capsule (500 mg total) by mouth 3 (three) times daily. 07/24/14   Arthor Captain, PA-C  hydrochlorothiazide (HYDRODIURIL) 25 MG tablet Take 1 tablet (25 mg total) by mouth daily. 03/02/14   Tommie Sams, DO  HYDROcodone-acetaminophen (NORCO) 5-325 MG per tablet Take 1 tablet by mouth every 6 (six) hours as needed for severe pain. 10/12/14   Mercedes Camprubi-Soms, PA-C  naproxen (NAPROSYN) 500 MG tablet Take 1 tablet (500 mg total) by mouth 2 (two) times daily as needed for mild pain, moderate pain or headache (TAKE WITH MEALS.). 07/23/14   Mercedes Camprubi-Soms, PA-C  naproxen (NAPROSYN) 500 MG tablet Take 1 tablet (500 mg total) by mouth 2 (two) times daily as needed for mild pain, moderate pain or headache (TAKE WITH MEALS.). 10/12/14   Mercedes Camprubi-Soms, PA-C  oxyCODONE-acetaminophen (PERCOCET) 10-325 MG per tablet Take 1 tablet by mouth every 4 (four) hours as needed for pain. 07/24/14   Abigail Harris, PA-C   BP 144/69 mmHg  Pulse 108  Temp(Src) 98.2 F (36.8 C) (Oral)  Resp 20  Ht  (1.651 m)  Wt 351 lb 14.4 oz (159.621 kg)  BMI 58.56 kg/m2  SpO2 96%  LMP 11/20/2014 Physical Exam  Constitutional: She is oriented to person, place, and time. She appears well-developed and well-nourished. No distress.  HENT:  Head: Normocephalic and atraumatic.  Eyes: Conjunctivae and EOM are normal.  Neck: Neck supple.  Cardiovascular: Normal rate.   Pulmonary/Chest: Effort normal.  Musculoskeletal: Normal range of motion.  Left ankle- tenderness with inversion and eversion. No pain with plantar and dorsiflexion. Normal skin tone. No bruise. Left knee is not TTP.  Neurological: She is alert and oriented to person, place, and time.  Skin: Skin is warm and dry.  Psychiatric: She has a normal mood and affect. Her behavior is normal.  Nursing note and vitals reviewed.   ED Course  Procedures (including critical care time) DIAGNOSTIC STUDIES: Oxygen  Saturation is 96% on RA, normal by my interpretation.   COORDINATION OF CARE: 10:53 PM- Pain consistent with arthralgia 2/2 large body habitus.  Doubt DVT or septic joint.  Pt is NVI.   Pt advised of plan for treatment and pt agrees. Pt has been advised to RICE left ankle as well as warm water soaks.   Labs Review Labs Reviewed - No data to display  Imaging Review Dg Ankle Complete Left  12/17/2014   CLINICAL DATA:  Ankle injury 5 days ago, subsequent swelling with pain along medial heel. History of foot injury.  EXAM: LEFT ANKLE COMPLETE - 3+ VIEW  COMPARISON:  None.  FINDINGS: No fracture deformity nor dislocation. The ankle mortise appears congruent and the tibiofibular syndesmosis intact. No destructive bony lesions. Soft tissue swelling without subcutaneous gas radiopaque foreign bodies.  IMPRESSION: Soft tissue swelling without acute osseous process.   Electronically Signed   By: Awilda Metro M.D.   On: 12/17/2014 22:18     EKG Interpretation None      MDM   Final diagnoses:  Arthralgia of left ankle    BP 144/69 mmHg  Pulse 108  Temp(Src) 98.2 F (36.8 C) (Oral)  Resp 20  Ht 5\' 5"  (1.651 m)  Wt 351 lb 14.4 oz (159.621 kg)  BMI 58.56 kg/m2  SpO2 96%  LMP 11/20/2014  I have reviewed nursing notes and vital signs. I personally viewed the imaging tests through PACS system and agrees with radiologist's intepretation I reviewed available ER/hospitalization records through the EMR   I personally performed the services described in this documentation, which was scribed in my presence. The recorded information has been reviewed and is accurate.     Fayrene Helper, PA-C 12/17/14 2302  Richardean Canal, MD 12/17/14 [phone]

## 2014-12-20 ENCOUNTER — Other Ambulatory Visit: Payer: Self-pay | Admitting: Podiatry

## 2014-12-20 ENCOUNTER — Ambulatory Visit (INDEPENDENT_AMBULATORY_CARE_PROVIDER_SITE_OTHER): Payer: No Typology Code available for payment source | Admitting: Podiatry

## 2014-12-20 ENCOUNTER — Ambulatory Visit (INDEPENDENT_AMBULATORY_CARE_PROVIDER_SITE_OTHER): Payer: No Typology Code available for payment source

## 2014-12-20 ENCOUNTER — Encounter: Payer: Self-pay | Admitting: Podiatry

## 2014-12-20 VITALS — BP 110/65 | HR 93 | Resp 16

## 2014-12-20 DIAGNOSIS — M7662 Achilles tendinitis, left leg: Principal | ICD-10-CM

## 2014-12-20 DIAGNOSIS — Q6650 Congenital pes planus, unspecified foot: Secondary | ICD-10-CM

## 2014-12-20 DIAGNOSIS — M7661 Achilles tendinitis, right leg: Secondary | ICD-10-CM

## 2014-12-20 DIAGNOSIS — M76829 Posterior tibial tendinitis, unspecified leg: Secondary | ICD-10-CM

## 2014-12-20 MED ORDER — METHYLPREDNISOLONE 4 MG PO TBPK: ORAL_TABLET | ORAL | Status: DC

## 2014-12-20 MED ORDER — MELOXICAM 15 MG PO TABS: 15.0000 mg | ORAL_TABLET | Freq: Every day | ORAL | Status: DC

## 2014-12-20 NOTE — Patient Instructions (Addendum)
Achilles Tendinitis  with Rehab Achilles tendinitis is a disorder of the Achilles tendon. The Achilles tendon connects the large calf muscles (Gastrocnemius and Soleus) to the heel bone (calcaneus). This tendon is sometimes called the heel cord. It is important for pushing-off and standing on your toes and is important for walking, running, or jumping. Tendinitis is often caused by overuse and repetitive microtrauma. SYMPTOMS  Pain, tenderness, swelling, warmth, and redness may occur over the Achilles tendon even at rest.  Pain with pushing off, or flexing or extending the ankle.  Pain that is worsened after or during activity. CAUSES   Overuse sometimes seen with rapid increase in exercise programs or in sports requiring running and jumping.  Poor physical conditioning (strength and flexibility or endurance).  Running sports, especially training running down hills.  Inadequate warm-up before practice or play or failure to stretch before participation.  Injury to the tendon. PREVENTION   Warm up and stretch before practice or competition.  Allow time for adequate rest and recovery between practices and competition.  Keep up conditioning.  Keep up ankle and leg flexibility.  Improve or keep muscle strength and endurance.  Improve cardiovascular fitness.  Use proper technique.  Use proper equipment (shoes, skates).  To help prevent recurrence, taping, protective strapping, or an adhesive bandage may be recommended for several weeks after healing is complete. PROGNOSIS   Recovery may take weeks to several months to heal.  Longer recovery is expected if symptoms have been prolonged.  Recovery is usually quicker if the inflammation is due to a direct blow as compared with overuse or sudden strain. RELATED COMPLICATIONS   Healing time will be prolonged if the condition is not correctly treated. The injury must be given plenty of time to heal.  Symptoms can reoccur if  activity is resumed too soon.  Untreated, tendinitis may increase the risk of tendon rupture requiring additional time for recovery and possibly surgery. TREATMENT   The first treatment consists of rest anti-inflammatory medication, and ice to relieve the pain.  Stretching and strengthening exercises after resolution of pain will likely help reduce the risk of recurrence. Referral to a physical therapist or athletic trainer for further evaluation and treatment may be helpful.  A walking boot or cast may be recommended to rest the Achilles tendon. This can help break the cycle of inflammation and microtrauma.  Arch supports (orthotics) may be prescribed or recommended by your caregiver as an adjunct to therapy and rest.  Surgery to remove the inflamed tendon lining or degenerated tendon tissue is rarely necessary and has shown less than predictable results. MEDICATION   Nonsteroidal anti-inflammatory medications, such as aspirin and ibuprofen, may be used for pain and inflammation relief. Do not take within 7 days before surgery. Take these as directed by your caregiver. Contact your caregiver immediately if any bleeding, stomach upset, or signs of allergic reaction occur. Other minor pain relievers, such as acetaminophen, may also be used.  Pain relievers may be prescribed as necessary by your caregiver. Do not take prescription pain medication for longer than 4 to 7 days. Use only as directed and only as much as you need.  Cortisone injections are rarely indicated. Cortisone injections may weaken tendons and predispose to rupture. It is better to give the condition more time to heal than to use them. HEAT AND COLD  Cold is used to relieve pain and reduce inflammation for acute and chronic Achilles tendinitis. Cold should be applied for 10 to 15 minutes   every 2 to 3 hours for inflammation and pain and immediately after any activity that aggravates your symptoms. Use ice packs or an ice  massage.  Heat may be used before performing stretching and strengthening activities prescribed by your caregiver. Use a heat pack or a warm soak. SEEK MEDICAL CARE IF:  Symptoms get worse or do not improve in 2 weeks despite treatment.  New, unexplained symptoms develop. Drugs used in treatment may produce side effects. EXERCISES RANGE OF MOTION (ROM) AND STRETCHING EXERCISES - Achilles Tendinitis  These exercises may help you when beginning to rehabilitate your injury. Your symptoms may resolve with or without further involvement from your physician, physical therapist or athletic trainer. While completing these exercises, remember:   Restoring tissue flexibility helps normal motion to return to the joints. This allows healthier, less painful movement and activity.  An effective stretch should be held for at least 30 seconds.  A stretch should never be painful. You should only feel a gentle lengthening or release in the stretched tissue. STRETCH - Gastroc, Standing   Place hands on wall.  Extend right / left leg, keeping the front knee somewhat bent.  Slightly point your toes inward on your back foot.  Keeping your right / left heel on the floor and your knee straight, shift your weight toward the wall, not allowing your back to arch.  You should feel a gentle stretch in the right / left calf. Hold this position for __________ seconds. Repeat __________ times. Complete this stretch __________ times per day. STRETCH - Soleus, Standing   Place hands on wall.  Extend right / left leg, keeping the other knee somewhat bent.  Slightly point your toes inward on your back foot.  Keep your right / left heel on the floor, bend your back knee, and slightly shift your weight over the back leg so that you feel a gentle stretch deep in your back calf.  Hold this position for __________ seconds. Repeat __________ times. Complete this stretch __________ times per day. STRETCH -  Gastrocsoleus, Standing  Note: This exercise can place a lot of stress on your foot and ankle. Please complete this exercise only if specifically instructed by your caregiver.   Place the ball of your right / left foot on a step, keeping your other foot firmly on the same step.  Hold on to the wall or a rail for balance.  Slowly lift your other foot, allowing your body weight to press your heel down over the edge of the step.  You should feel a stretch in your right / left calf.  Hold this position for __________ seconds.  Repeat this exercise with a slight bend in your knee. Repeat __________ times. Complete this stretch __________ times per day.  STRENGTHENING EXERCISES - Achilles Tendinitis These exercises may help you when beginning to rehabilitate your injury. They may resolve your symptoms with or without further involvement from your physician, physical therapist or athletic trainer. While completing these exercises, remember:   Muscles can gain both the endurance and the strength needed for everyday activities through controlled exercises.  Complete these exercises as instructed by your physician, physical therapist or athletic trainer. Progress the resistance and repetitions only as guided.  You may experience muscle soreness or fatigue, but the pain or discomfort you are trying to eliminate should never worsen during these exercises. If this pain does worsen, stop and make certain you are following the directions exactly. If the pain is still present after   adjustments, discontinue the exercise until you can discuss the trouble with your clinician. STRENGTH - Plantar-flexors   Sit with your right / left leg extended. Holding onto both ends of a rubber exercise band/tubing, loop it around the ball of your foot. Keep a slight tension in the band.  Slowly push your toes away from you, pointing them downward.  Hold this position for __________ seconds. Return slowly, controlling the  tension in the band/tubing. Repeat __________ times. Complete this exercise __________ times per day.  STRENGTH - Plantar-flexors   Stand with your feet shoulder width apart. Steady yourself with a wall or table using as little support as needed.  Keeping your weight evenly spread over the width of your feet, rise up on your toes.*  Hold this position for __________ seconds. Repeat __________ times. Complete this exercise __________ times per day.  *If this is too easy, shift your weight toward your right / left leg until you feel challenged. Ultimately, you may be asked to do this exercise with your right / left foot only. STRENGTH - Plantar-flexors, Eccentric  Note: This exercise can place a lot of stress on your foot and ankle. Please complete this exercise only if specifically instructed by your caregiver.   Place the balls of your feet on a step. With your hands, use only enough support from a wall or rail to keep your balance.  Keep your knees straight and rise up on your toes.  Slowly shift your weight entirely to your right / left toes and pick up your opposite foot. Gently and with controlled movement, lower your weight through your right / left foot so that your heel drops below the level of the step. You will feel a slight stretch in the back of your calf at the end position.  Use the healthy leg to help rise up onto the balls of both feet, then lower weight only on the right / left leg again. Build up to 15 repetitions. Then progress to 3 consecutive sets of 15 repetitions.*  After completing the above exercise, complete the same exercise with a slight knee bend (about 30 degrees). Again, build up to 15 repetitions. Then progress to 3 consecutive sets of 15 repetitions.* Perform this exercise __________ times per day.  *When you easily complete 3 sets of 15, your physician, physical therapist or athletic trainer may advise you to add resistance by wearing a backpack filled with  additional weight. STRENGTH - Plantar Flexors, Seated   Sit on a chair that allows your feet to rest flat on the ground. If necessary, sit at the edge of the chair.  Keeping your toes firmly on the ground, lift your right / left heel as far as you can without increasing any discomfort in your ankle. Repeat __________ times. Complete this exercise __________ times a day. *If instructed by your physician, physical therapist or athletic trainer, you may add ____________________ of resistance by placing a weighted object on your right / left knee. Document Released: 11/28/2004 Document Revised: 07/22/2011 Document Reviewed: 08/11/2008 [facility] Patient Information 2015 [place], [place]. This information is not intended to replace advice given to you by your health care provider. Make sure you discuss any questions you have with your health care provider. Achilles Tendinitis Achilles tendinitis is inflammation of the tough, cord-like band that attaches the lower muscles of your leg to your heel (Achilles tendon). It is usually caused by overusing the tendon and joint involved.  CAUSES Achilles tendinitis can happen because of:  A sudden increase in exercise or activity (such as running).  Doing the same exercises or activities (such as jumping) over and over.  Not warming up calf muscles before exercising.  Exercising in shoes that are worn out or not made for exercise.  Having arthritis or a bone growth on the back of the heel bone. This can rub against the tendon and hurt the tendon. SIGNS AND SYMPTOMS The most common symptoms are:  Pain in the back of the leg, just above the heel. The pain usually gets worse with exercise and better with rest.  Stiffness or soreness in the back of the leg, especially in the morning.  Swelling of the skin over the Achilles tendon.  Trouble standing on tiptoe. Sometimes, an Achilles tendon tears (ruptures). Symptoms of an Achilles tendon rupture can  include:  Sudden, severe pain in the back of the leg.  Trouble putting weight on the foot or walking normally. DIAGNOSIS Achilles tendinitis will be diagnosed based on symptoms and a physical examination. An X-ray may be done to check if another condition is causing your symptoms. An MRI may be ordered if your health care provider suspects you may have completely torn your tendon, which is called an Achilles tendon rupture.  TREATMENT  Achilles tendinitis usually gets better over time. It can take weeks to months to heal completely. Treatment focuses on treating the symptoms and helping the injury heal. HOME CARE INSTRUCTIONS   Rest your Achilles tendon and avoid activities that cause pain.  Apply ice to the injured area:  Put ice in a plastic bag.  Place a towel between your skin and the bag.  Leave the ice on for 20 minutes, 2-3 times a day  Try to avoid using the tendon (other than gentle range of motion) while the tendon is painful. Do not resume use until instructed by your health care provider. Then begin use gradually. Do not increase use to the point of pain. If pain does develop, decrease use and continue the above measures. Gradually increase activities that do not cause discomfort until you achieve normal use.  Do exercises to make your calf muscles stronger and more flexible. Your health care provider or physical therapist can recommend exercises for you to do.  Wrap your ankle with an elastic bandage or other wrap. This can help keep your tendon from moving too much. Your health care provider will show you how to wrap your ankle correctly.  Only take over-the-counter or prescription medicines for pain, discomfort, or fever as directed by your health care provider. SEEK MEDICAL CARE IF:   Your pain and swelling increase or pain is uncontrolled with medicines.  You develop new, unexplained symptoms or your symptoms get worse.  You are unable to move your toes or  foot.  You develop warmth and swelling in your foot.  You have an unexplained temperature. MAKE SURE YOU:   Understand these instructions.  Will watch your condition.  Will get help right away if you are not doing well or get worse. Document Released: 02/06/2005 Document Revised: 02/17/2013 Document Reviewed: 12/09/2012 [facility] Patient Information 2015 [place], [place]. This information is not intended to replace advice given to you by your health care provider. Make sure you discuss any questions you have with your health care provider.

## 2014-12-20 NOTE — Progress Notes (Signed)
   Subjective:    Patient ID: Sara Taylor, female    DOB: 1991-02-13, 24 y.o.   MRN: [id number]  HPI Patient presents with bilateral foot pain, heel. Pt stated, "more pain in Left foot". Left foot is swollen. Going on for past 2-3 months. Swelling been going on for past week. Pt has soaked feet in epsom salt, with some relief.  Review of Systems  Constitutional: Positive for unexpected weight change.  Cardiovascular: Positive for leg swelling.  Musculoskeletal: Positive for arthralgias.  All other systems reviewed and are negative.      Objective:   Physical Exam: I have reviewed her past medical history medications allergies surgery social history review of systems. Pulses are strongly palpable bilateral. Neurologic sensorium is intact. Deep tendon reflexes are intact bilateral. Muscle strength +5 over 5 dorsiflexion plantar flexors and inverters everters all into the musculature is intact. Orthopedic evaluation demonstrates all joints distal to the ankle for range of motion without crepitation. She has pes planus bilateral. She has mild tenderness on palpation of the Achilles tendon at its insertion site on the posterior calcaneus. She also has swelling along the medial aspect of the talonavicular joint and the posterior tibial tendon as it courses beneath the medial malleolus extending out to the navicular tuberosity. There appears to be some fluctuance in this area. The tendon itself appears to be intact and strong. She states that she wears slides or Crocks. States that she hardly wears tennis shoes ever she's goes barefoot a lot of times at home. Radiographs do not demonstrate any type of major osseous abnormalities in the foot other than pes planus.        Assessment & Plan:  Assessment: Posterior tibial tendinitis left. Achilles tendinitis bilateral. Pes planus bilateral.  Plan: Discussed etiology pathology conservative versus surgical therapies. At this point I placed her on a  Medrol Dosepak to be followed by meloxicam. Discussed appropriate shoe gear stretching exercises ice therapy and shoe modifications. And I will follow-up with her as needed.

## 2015-06-05 IMAGING — CR DG SHOULDER 2+V*R*
3 series · 3 of 3 positions shown · non-contrast
Comparison: None.

CLINICAL DATA: Right shoulder pain today.  No trauma.

EXAM:
RIGHT SHOULDER - 2+ VIEW

[shoulder grashey]
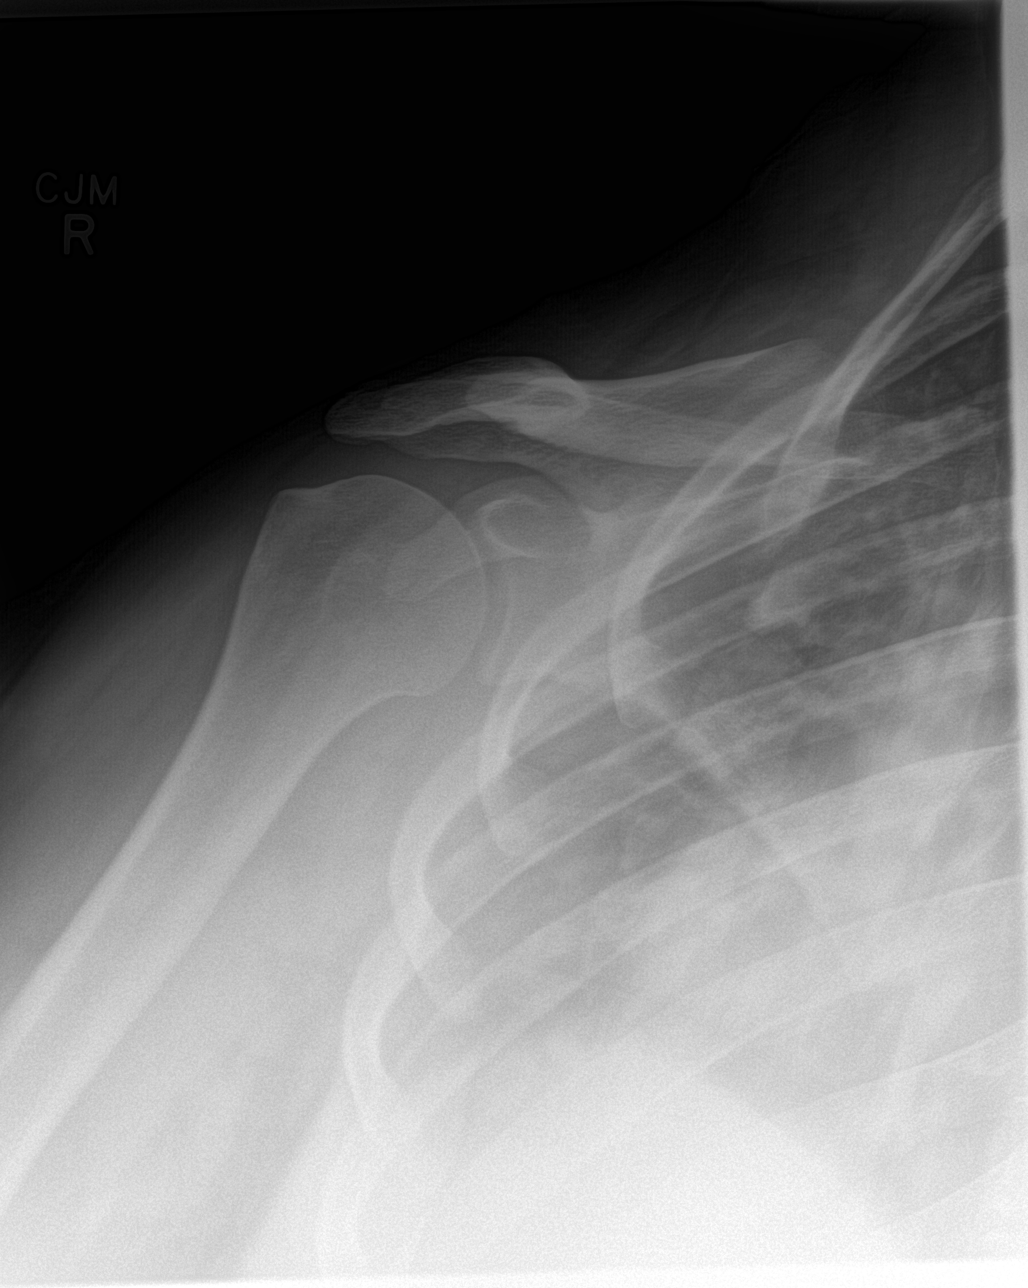

[shoulder y view]
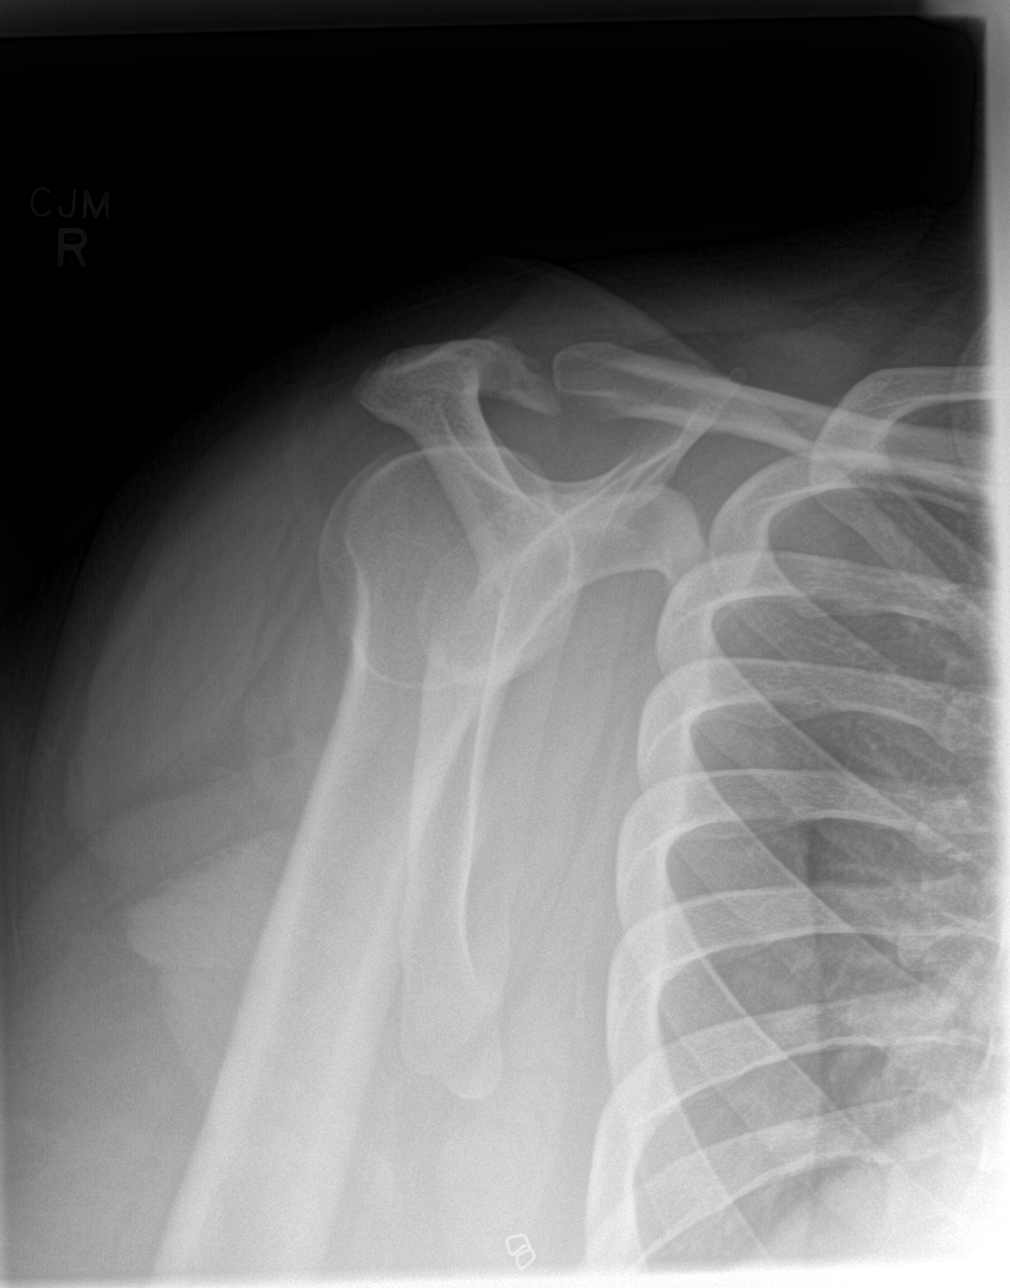

[shoulder axillary]
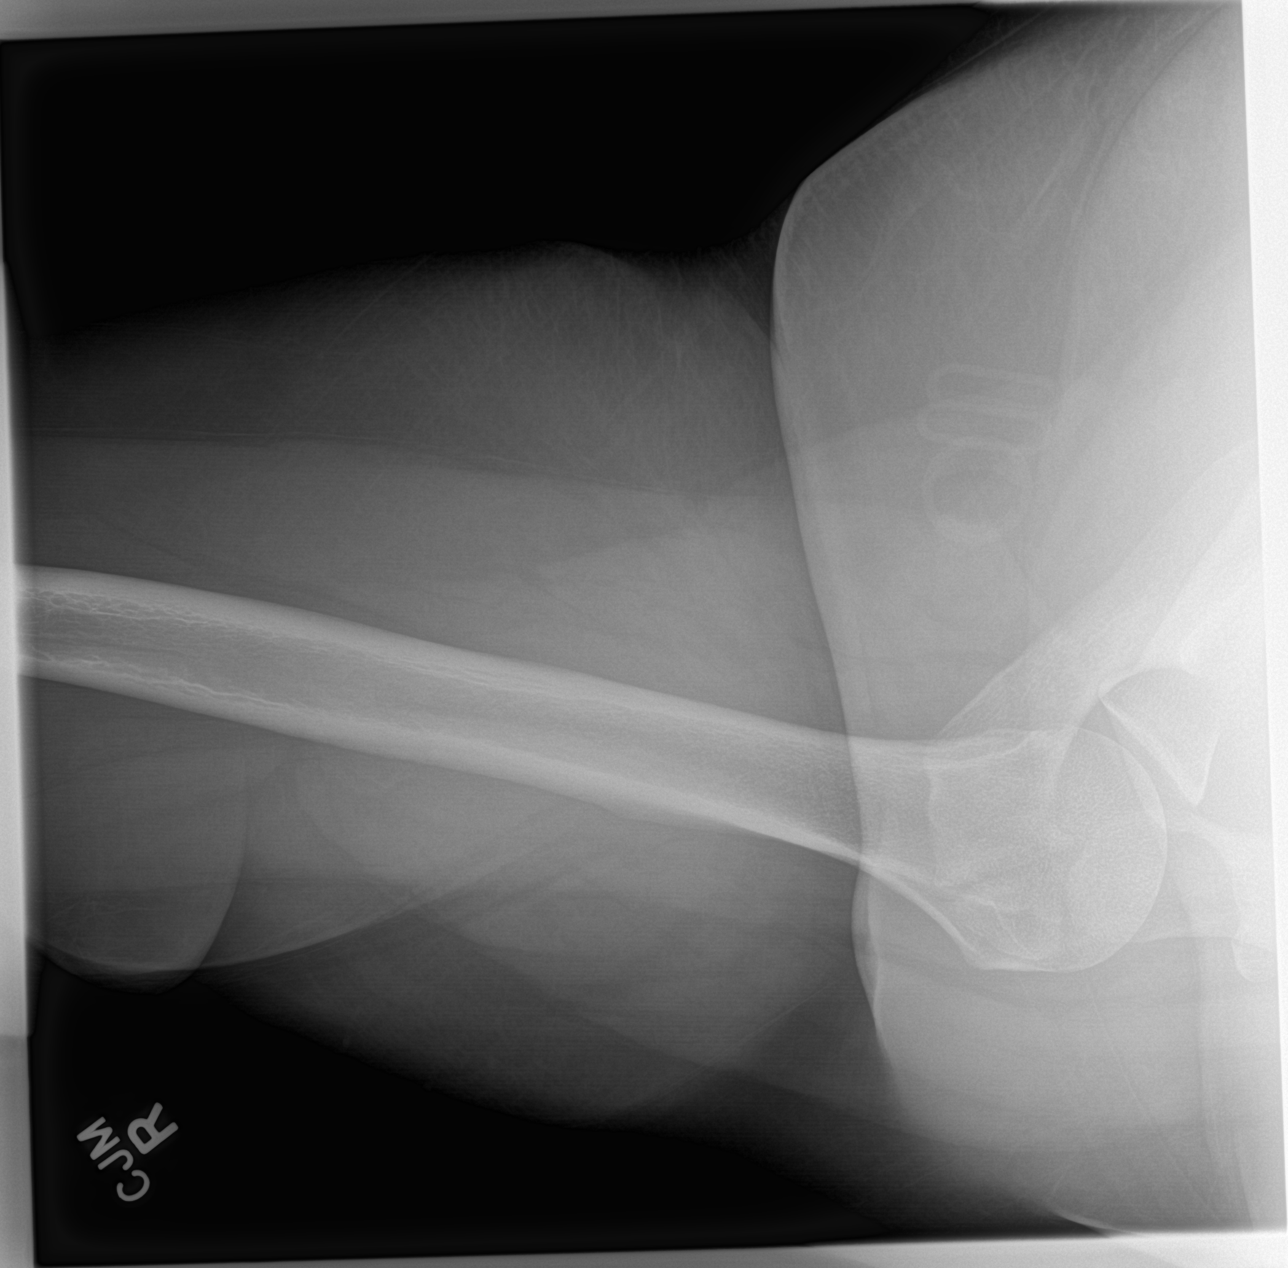

[3 of 3 positions shown; findings below may reference images not displayed]

FINDINGS: Examination demonstrates no evidence of fracture or dislocation. No
significant degenerative changes. Suggestion of an os acromiale
normal variant.
IMPRESSION: No acute findings.

## 2015-06-12 ENCOUNTER — Encounter (HOSPITAL_COMMUNITY): Payer: Self-pay | Admitting: *Deleted

## 2015-06-12 ENCOUNTER — Emergency Department (HOSPITAL_COMMUNITY)
Admission: EM | Admit: 2015-06-12 | Discharge: 2015-06-12 | Disposition: A | Discharge status: Home / Self Care | Payer: Self-pay | Attending: Emergency Medicine | Admitting: Emergency Medicine

## 2015-06-12 ENCOUNTER — Emergency Department (HOSPITAL_COMMUNITY): Payer: Self-pay

## 2015-06-12 DIAGNOSIS — R102 Pelvic and perineal pain: Secondary | ICD-10-CM

## 2015-06-12 DIAGNOSIS — Z3202 Encounter for pregnancy test, result negative: Secondary | ICD-10-CM | POA: Insufficient documentation

## 2015-06-12 DIAGNOSIS — Z8742 Personal history of other diseases of the female genital tract: Secondary | ICD-10-CM | POA: Insufficient documentation

## 2015-06-12 DIAGNOSIS — Z79899 Other long term (current) drug therapy: Secondary | ICD-10-CM | POA: Insufficient documentation

## 2015-06-12 DIAGNOSIS — F17210 Nicotine dependence, cigarettes, uncomplicated: Secondary | ICD-10-CM | POA: Insufficient documentation

## 2015-06-12 DIAGNOSIS — Z791 Long term (current) use of non-steroidal anti-inflammatories (NSAID): Secondary | ICD-10-CM | POA: Insufficient documentation

## 2015-06-12 DIAGNOSIS — I10 Essential (primary) hypertension: Secondary | ICD-10-CM | POA: Insufficient documentation

## 2015-06-12 DIAGNOSIS — A5901 Trichomonal vulvovaginitis: Secondary | ICD-10-CM

## 2015-06-12 HISTORY — DX: Unspecified ovarian cyst, unspecified side: N83.209

## 2015-06-12 LAB — CBC
HEMATOCRIT: 38.9 % (ref 36.0–46.0)
HEMOGLOBIN: 12.9 g/dL (ref 12.0–15.0)
MCH: 29.2 pg (ref 26.0–34.0)
MCHC: 33.2 g/dL (ref 30.0–36.0)
MCV: 88 fL (ref 78.0–100.0)
Platelets: 219 10*3/uL (ref 150–400)
RBC: 4.42 MIL/uL (ref 3.87–5.11)
RDW: 13.2 % (ref 11.5–15.5)
WBC: 6 10*3/uL (ref 4.0–10.5)

## 2015-06-12 LAB — COMPREHENSIVE METABOLIC PANEL
ALT: 17 U/L (ref 14–54)
AST: 20 U/L (ref 15–41)
Albumin: 3.9 g/dL (ref 3.5–5.0)
Alkaline Phosphatase: 65 U/L (ref 38–126)
Anion gap: 11 (ref 5–15)
BUN: 5 mg/dL — ABNORMAL LOW (ref 6–20)
CO2: 25 mmol/L (ref 22–32)
Calcium: 9.5 mg/dL (ref 8.9–10.3)
Chloride: 105 mmol/L (ref 101–111)
Creatinine, Ser: 0.89 mg/dL (ref 0.44–1.00)
GFR calc Af Amer: >60 mL/min (ref >60)
GLUCOSE: 97 mg/dL (ref 65–99)
POTASSIUM: 4.1 mmol/L (ref 3.5–5.1)
Sodium: 141 mmol/L (ref 135–145)
TOTAL PROTEIN: 7.7 g/dL (ref 6.5–8.1)
Total Bilirubin: 0.5 mg/dL (ref 0.3–1.2)

## 2015-06-12 LAB — URINALYSIS, ROUTINE W REFLEX MICROSCOPIC
BILIRUBIN URINE: NEGATIVE
Glucose, UA: NEGATIVE mg/dL
Hgb urine dipstick: NEGATIVE
Ketones, ur: NEGATIVE mg/dL
LEUKOCYTES UA: NEGATIVE
NITRITE: NEGATIVE
Protein, ur: NEGATIVE mg/dL
SPECIFIC GRAVITY, URINE: 1.024 (ref 1.005–1.030)
pH: 5.5 (ref 5.0–8.0)

## 2015-06-12 LAB — WET PREP, GENITAL
Clue Cells Wet Prep HPF POC: NONE SEEN
SPERM: NONE SEEN
YEAST WET PREP: NONE SEEN

## 2015-06-12 LAB — HCG, QUANTITATIVE, PREGNANCY: HCG, BETA CHAIN, QUANT, S: 1 m[IU]/mL (ref <5)

## 2015-06-12 MED ORDER — LIDOCAINE HCL (PF) 1 % IJ SOLN
0.9000 mL | Freq: Once | INTRAMUSCULAR | Status: AC
  Administered 2015-06-12: 0.9 mL
  Filled 2015-06-12: qty 5

## 2015-06-12 MED ORDER — AZITHROMYCIN 250 MG PO TABS
1000.0000 mg | ORAL_TABLET | Freq: Once | ORAL | Status: AC
Start: 2015-06-12 — End: 2015-06-12
  Administered 2015-06-12: 1000 mg via ORAL
  Filled 2015-06-12: qty 4

## 2015-06-12 MED ORDER — ONDANSETRON 4 MG PO TBDP
4.0000 mg | ORAL_TABLET | Freq: Once | ORAL | Status: AC
  Administered 2015-06-12: 4 mg via ORAL
  Filled 2015-06-12: qty 1

## 2015-06-12 MED ORDER — METRONIDAZOLE 500 MG PO TABS: 500.0000 mg | ORAL_TABLET | Freq: Two times a day (BID) | ORAL | Status: DC

## 2015-06-12 MED ORDER — CEFTRIAXONE SODIUM 250 MG IJ SOLR
250.0000 mg | Freq: Once | INTRAMUSCULAR | Status: AC
  Administered 2015-06-12: 250 mg via INTRAMUSCULAR
  Filled 2015-06-12: qty 250

## 2015-06-12 NOTE — ED Provider Notes (Signed)
CSN: [id number]     Arrival date & time 06/12/15  1831 History  By signing my name below, I, Emmanuella Mensah, attest that this documentation has been prepared under the direction and in the presence of Kerrie Buffalo, NP. Electronically Signed: Angelene Giovanni, ED Scribe. 06/12/2015. 8:05 PM.    Chief Complaint  Patient presents with  . Abdominal Pain   Patient is a 25 y.o. female presenting with abdominal pain. The history is provided by the patient. No language interpreter was used.  Abdominal Pain Pain location: Mid lower. Pain radiates to:  Does not radiate Pain severity:  Moderate Onset quality:  Gradual Duration:  1 day Timing:  Constant Progression:  Worsening Chronicity:  Recurrent Relieved by:  None tried Worsened by:  Nothing tried Ineffective treatments:  None tried Associated symptoms: no chills, no diarrhea, no fever, no nausea, no vaginal bleeding, no vaginal discharge and no vomiting    HPI Comments: Sara Taylor is a 25 y.o. female with a hx of ovarian cyst who presents to the Emergency Department complaining of gradually worsening constant non-radiating 7/10 mid lower abdominal pain onset yesterday. No alleviating factors noted. Pt has not taken any medications for her symptoms. She reports that she currently has one sexual partner x 3 years and has a hx of Gonorrhea 3 years ago. She denies a [place] during her ovarian cyst diagnosis and states that during that diagnosis her pain was in her left quadrant while this time it is the entire lower abdomen. She reports an allergy to Doxycycline. She denies any fever, chills, vaginal pain, vaginal discharge or bleeding, n/v/d, or back pain.    Past Medical History  Diagnosis Date  . Hypertension   . Ovarian cyst    History reviewed. No pertinent past surgical history. Family History  Problem Relation Age of Onset  . Hypertension Maternal Grandmother   . Hypertension Maternal Grandfather   . Hypertension Paternal  Grandmother   . Diabetes Paternal Grandmother    Social History  Substance Use Topics  . Smoking status: Current Every Day Smoker -- 0.20 packs/day    Types: Cigarettes  . Smokeless tobacco: None  . Alcohol Use: Yes     Comment: occ   OB History    No data available     Review of Systems  Constitutional: Negative for fever and chills.  Gastrointestinal: Positive for abdominal pain. Negative for nausea, vomiting and diarrhea.  Genitourinary: Negative for vaginal bleeding and vaginal discharge.  Musculoskeletal: Negative for back pain.  All other systems reviewed and are negative.     Allergies  Doxycycline  Home Medications   Prior to Admission medications   Medication Sig Start Date End Date Taking? Authorizing Provider  acetaminophen-codeine (TYLENOL #3) 300-30 MG per tablet Take 1-2 tablets by mouth every 6 (six) hours as needed for moderate pain. 12/17/14   Fayrene Helper, PA-C  albuterol (PROVENTIL HFA;VENTOLIN HFA) 108 (90 BASE) MCG/ACT inhaler Inhale 1-2 puffs into the lungs every 6 (six) hours as needed for wheezing or shortness of breath. 07/11/14   Zadie Rhine, MD  hydrochlorothiazide (HYDRODIURIL) 25 MG tablet Take 1 tablet (25 mg total) by mouth daily. 03/02/14   Tommie Sams, DO  meloxicam (MOBIC) 15 MG tablet Take 1 tablet (15 mg total) by mouth daily. 12/20/14   Max T Hyatt, DPM  methylPREDNISolone (MEDROL) 4 MG TBPK tablet Tapering 6 day dose pack 12/20/14   Max T Hyatt, DPM  metroNIDAZOLE (FLAGYL) 500 MG tablet Take 1 tablet (  500 mg total) by mouth 2 (two) times daily. 06/12/15   Harrel Ferrone Orlene Och, NP  naproxen (NAPROSYN) 500 MG tablet Take 1 tablet (500 mg total) by mouth 2 (two) times daily as needed for mild pain, moderate pain or headache (TAKE WITH MEALS.). 10/12/14   Mercedes Camprubi-Soms, PA-C   BP 172/91 mmHg  Pulse 77  Temp(Src) 98.9 F (37.2 C) (Oral)  Resp 24  Ht  (1.651 m)  Wt 159.213 kg  BMI 58.41 kg/m2  SpO2 99%  LMP 02/10/2015 Physical Exam   Constitutional: She is oriented to person, place, and time. No distress.  Morbidly obese  HENT:  Head: Normocephalic and atraumatic.  Eyes: Conjunctivae and EOM are normal.  Neck: Normal range of motion. Neck supple.  Cardiovascular: Normal rate and regular rhythm.   Pulmonary/Chest: Effort normal and breath sounds normal.  Abdominal: Soft. Bowel sounds are normal. There is tenderness. There is no rebound and no guarding.  Generalized abdominal pain with increased pain in the lower abdomen  Genitourinary:  External genitalia without lesions, frothy d/c vaginal vault, positive CMT, bilateral adnexal tenderness. Unable to palpate uterus due to patient habitus.   Musculoskeletal: Normal range of motion.  Neurological: She is alert and oriented to person, place, and time.  Skin: Skin is warm and dry.  Psychiatric: She has a normal mood and affect. Her behavior is normal.  Nursing note and vitals reviewed.   ED Course  Procedures (including critical care time) DIAGNOSTIC STUDIES: Oxygen Saturation is 98% on RA, normal by my interpretation.    COORDINATION OF CARE: 8:01 PM- Pt advised of plan for treatment and pt agrees. Pt will receive a pelvic exam and a [place] for further evaluation.    Labs Review Results for orders placed or performed during the hospital encounter of 06/12/15 (from the past 24 hour(s))  Comprehensive metabolic panel     Status: Abnormal   Collection Time: 06/12/15  6:45 PM  Result Value Ref Range   Sodium 141 135 - 145 mmol/L   Potassium 4.1 3.5 - 5.1 mmol/L   Chloride 105 101 - 111 mmol/L   CO2 25 22 - 32 mmol/L   Glucose, Bld 97 65 - 99 mg/dL   BUN 5 (L) 6 - 20 mg/dL   Creatinine, Ser 4.69 0.44 - 1.00 mg/dL   Calcium 9.5 8.9 - 62.9 mg/dL   Total Protein 7.7 6.5 - 8.1 g/dL   Albumin 3.9 3.5 - 5.0 g/dL   AST 20 15 - 41 U/L   ALT 17 14 - 54 U/L   Alkaline Phosphatase 65 38 - 126 U/L   Total Bilirubin 0.5 0.3 - 1.2 mg/dL   GFR calc non Af Amer >60 >60 mL/min    GFR calc Af Amer >60 >60 mL/min   Anion gap 11 5 - 15  CBC     Status: None   Collection Time: 06/12/15  6:45 PM  Result Value Ref Range   WBC 6.0 4.0 - 10.5 K/uL   RBC 4.42 3.87 - 5.11 MIL/uL   Hemoglobin 12.9 12.0 - 15.0 g/dL   HCT 52.8 41.3 - 24.4 %   MCV 88.0 78.0 - 100.0 fL   MCH 29.2 26.0 - 34.0 pg   MCHC 33.2 30.0 - 36.0 g/dL   RDW 01.0 27.2 - 53.6 %   Platelets 219 150 - 400 K/uL  hCG, quantitative, pregnancy     Status: None   Collection Time: 06/12/15  6:45 PM  Result Value Ref  Range   hCG, Beta Chain, Quant, S 1 <5 mIU/mL  Urinalysis, Routine w reflex microscopic (not at [facility])     Status: Abnormal   Collection Time: 06/12/15  7:58 PM  Result Value Ref Range   Color, Urine YELLOW YELLOW   APPearance HAZY (A) CLEAR   Specific Gravity, Urine 1.024 1.005 - 1.030   pH 5.5 5.0 - 8.0   Glucose, UA NEGATIVE NEGATIVE mg/dL   Hgb urine dipstick NEGATIVE NEGATIVE   Bilirubin Urine NEGATIVE NEGATIVE   Ketones, ur NEGATIVE NEGATIVE mg/dL   Protein, ur NEGATIVE NEGATIVE mg/dL   Nitrite NEGATIVE NEGATIVE   Leukocytes, UA NEGATIVE NEGATIVE  Wet prep, genital     Status: Abnormal   Collection Time: 06/12/15  9:51 PM  Result Value Ref Range   Yeast Wet Prep HPF POC NONE SEEN NONE SEEN   Trich, Wet Prep PRESENT (A) NONE SEEN   Clue Cells Wet Prep HPF POC NONE SEEN NONE SEEN   WBC, Wet Prep HPF POC MODERATE (A) NONE SEEN   Sperm NONE SEEN      Imaging Review US Transvaginal Non-ob  06/12/2015  CLINICAL DATA:  Lower abdominal and pelvic pain for 1 day. EXAM: TRANSABDOMINAL AND TRANSVAGINAL ULTRASOUND OF PELVIS TECHNIQUE: Both transabdominal and transvaginal ultrasound examinations of the pelvis were performed. Transabdominal technique was performed for global imaging of the pelvis including uterus, ovaries, adnexal regions, and pelvic cul-de-sac. It was necessary to proceed with endovaginal exam following the transabdominal exam to visualize the right and left ovary, and  endometrium. COMPARISON:  None FINDINGS: Uterus Measurements: 6.0 x 2.6 x 4.1 cm. No fibroids or other mass visualized. Best assessed transvaginally. Endometrium Thickness: 7 mm.  No focal abnormality visualized. Right ovary Measurements: 2.8 x 1.7 x 1.4 cm. Normal appearance/no adnexal mass. Physiologic follicles and blood flow are seen. Left ovary Measurements: 2.2 x 1.9 x 1.8 cm. Normal appearance/no adnexal mass. Physiologic follicles and blood flow seen. Other findings No abnormal free fluid. Trace free fluid in the cul-de-sac physiologic. IMPRESSION: Normal pelvic ultrasound. Electronically Signed   By: Rubye Oaks M.D.   On: 06/12/2015 21:17   US Pelvis Complete  06/12/2015  CLINICAL DATA:  Lower abdominal and pelvic pain for 1 day. EXAM: TRANSABDOMINAL AND TRANSVAGINAL ULTRASOUND OF PELVIS TECHNIQUE: Both transabdominal and transvaginal ultrasound examinations of the pelvis were performed. Transabdominal technique was performed for global imaging of the pelvis including uterus, ovaries, adnexal regions, and pelvic cul-de-sac. It was necessary to proceed with endovaginal exam following the transabdominal exam to visualize the right and left ovary, and endometrium. COMPARISON:  None FINDINGS: Uterus Measurements: 6.0 x 2.6 x 4.1 cm. No fibroids or other mass visualized. Best assessed transvaginally. Endometrium Thickness: 7 mm.  No focal abnormality visualized. Right ovary Measurements: 2.8 x 1.7 x 1.4 cm. Normal appearance/no adnexal mass. Physiologic follicles and blood flow are seen. Left ovary Measurements: 2.2 x 1.9 x 1.8 cm. Normal appearance/no adnexal mass. Physiologic follicles and blood flow seen. Other findings No abnormal free fluid. Trace free fluid in the cul-de-sac physiologic. IMPRESSION: Normal pelvic ultrasound. Electronically Signed   By: Rubye Oaks M.D.   On: 06/12/2015 21:17     Kerrie Buffalo, NP has personally reviewed and evaluated these images and lab results as part of  her medical decision-making.   MDM  25 y.o. female with pelvic pain and trichomonas infection. Treated to cover GC and Chlamydia with Rocephin 250 mg IM and Zithromax 1 gram PO. Rx for Flagyl for  trichomonas. Stable for d/c without TOA or torsion noted on ultrasound. Patient is afebrile and does not appear toxic. Discussed with the patient clinical,lab and ultrasound findings and plan of care. All questioned fully answered. She will follow up with her PCP or the [facility] or return as needed.    Final diagnoses:  Trichomonas vaginitis   I personally performed the services described in this documentation, which was scribed in my presence. The recorded information has been reviewed and is accurate.   [address] [place], NP 06/12/15 2247  Rolland Porter, MD 06/17/15 [phone]

## 2015-06-12 NOTE — ED Notes (Signed)
Pt states lower abdominal pain since yesterday.  Denies vaginal discharge, nv or diarrhea. States pain similar to the last time she had an ovarian cyst.

## 2015-06-13 LAB — GC/CHLAMYDIA PROBE AMP (~~LOC~~) NOT AT ARMC
Chlamydia: NEGATIVE
Neisseria Gonorrhea: NEGATIVE

## 2015-06-13 LAB — RPR: RPR: NONREACTIVE

## 2015-06-13 LAB — HIV ANTIBODY (ROUTINE TESTING W REFLEX): HIV SCREEN 4TH GENERATION: NONREACTIVE

## 2015-07-14 ENCOUNTER — Ambulatory Visit: Payer: Self-pay

## 2015-09-05 ENCOUNTER — Other Ambulatory Visit: Payer: Self-pay | Admitting: Family Medicine

## 2015-09-05 MED ORDER — HYDROCHLOROTHIAZIDE 25 MG PO TABS: 25.0000 mg | ORAL_TABLET | Freq: Every day | ORAL | Status: DC

## 2015-09-05 NOTE — Telephone Encounter (Signed)
Has not been seen for her BP in over 3 years.  Please have patient schedule annual exam with fasting labs.  HCTZ sent in x1 month only.  Must have OV before further refills.  Please let patient know this.

## 2015-09-05 NOTE — Telephone Encounter (Signed)
Pt is calling for a [facility] jw

## 2015-09-06 NOTE — Telephone Encounter (Signed)
Called both the [phone] and the  [phone] Mobile number. No answer, no voicemail to leave a message. Sunday SpillersSharon T Hien Cunliffe, CMA

## 2015-09-12 NOTE — Telephone Encounter (Signed)
Called again, no answer, no VM. Sunday SpillersSharon T Saunders, CMA

## 2015-09-13 NOTE — Telephone Encounter (Signed)
Contacted pt and informed her of below and tried to schedule an appointment in June but schedule was not available.  Pt stated she would call back at the latter part of May to schedule this. Lamonte SakaiZimmerman Rumple, April D, [place]

## 2015-10-11 ENCOUNTER — Encounter (HOSPITAL_COMMUNITY): Payer: Self-pay | Admitting: Emergency Medicine

## 2015-10-11 ENCOUNTER — Emergency Department (HOSPITAL_COMMUNITY)
Admission: EM | Admit: 2015-10-11 | Discharge: 2015-10-11 | Disposition: A | Discharge status: Home / Self Care | Payer: Self-pay | Attending: Emergency Medicine | Admitting: Emergency Medicine

## 2015-10-11 DIAGNOSIS — I10 Essential (primary) hypertension: Secondary | ICD-10-CM | POA: Insufficient documentation

## 2015-10-11 DIAGNOSIS — X58XXXA Exposure to other specified factors, initial encounter: Secondary | ICD-10-CM | POA: Insufficient documentation

## 2015-10-11 DIAGNOSIS — Z791 Long term (current) use of non-steroidal anti-inflammatories (NSAID): Secondary | ICD-10-CM | POA: Insufficient documentation

## 2015-10-11 DIAGNOSIS — Y939 Activity, unspecified: Secondary | ICD-10-CM | POA: Insufficient documentation

## 2015-10-11 DIAGNOSIS — F17210 Nicotine dependence, cigarettes, uncomplicated: Secondary | ICD-10-CM | POA: Insufficient documentation

## 2015-10-11 DIAGNOSIS — M25562 Pain in left knee: Secondary | ICD-10-CM

## 2015-10-11 DIAGNOSIS — Y929 Unspecified place or not applicable: Secondary | ICD-10-CM | POA: Insufficient documentation

## 2015-10-11 DIAGNOSIS — Y990 Civilian activity done for income or pay: Secondary | ICD-10-CM | POA: Insufficient documentation

## 2015-10-11 MED ORDER — NAPROXEN 500 MG PO TABS: 500.0000 mg | ORAL_TABLET | Freq: Two times a day (BID) | ORAL | Status: DC | PRN

## 2015-10-11 MED ORDER — IBUPROFEN 200 MG PO TABS
600.0000 mg | ORAL_TABLET | Freq: Once | ORAL | Status: AC
  Administered 2015-10-11: 600 mg via ORAL
  Filled 2015-10-11: qty 3

## 2015-10-11 NOTE — ED Provider Notes (Signed)
CSN: [id number]     Arrival date & time 10/11/15  1245 History  By signing my name below, I, Ronney LionSuzanne Le, attest that this documentation has been prepared under the direction and in the presence of Shantee Hayne, PA-C. Electronically Signed: Ronney LionSuzanne Le, ED Scribe. 10/11/2015. 1:57 PM.    Chief Complaint  Patient presents with  . Knee Pain   The history is provided by the patient. No language interpreter was used.     HPI Comments: Sara Taylor is a 25 y.o. female with a history of hypertension, who presents to the Emergency Department complaining of gradual-onset, constant, worsening posterior left knee pain that began at work last night. Patient states she had started working at a new job 2 weeks ago that requires standing on concrete for 9-hour shifts, and she believes this to be the cause of her symptoms. Patient states she has been wearing sneakers with poor support at work. She states she had tried to pop her knee last night when her pain, which alleviated her pain, but she woke up with pain again this morning. She states she has been able to ambulate, although doing so causes increased pain. Patient reports she had taken 2 ibuprofen, with mild relief.    Past Medical History  Diagnosis Date  . Hypertension   . Ovarian cyst    No past surgical history on file. Family History  Problem Relation Age of Onset  . Hypertension Maternal Grandmother   . Hypertension Maternal Grandfather   . Hypertension Paternal Grandmother   . Diabetes Paternal Grandmother    Social History  Substance Use Topics  . Smoking status: Current Every Day Smoker -- 0.20 packs/day    Types: Cigarettes  . Smokeless tobacco: None  . Alcohol Use: Yes     Comment: occ   OB History    No data available     Review of Systems  Musculoskeletal: Positive for arthralgias.  Neurological: Negative for weakness and numbness.   Allergies  Doxycycline  Home Medications   Prior to Admission medications    Medication Sig Start Date End Date Taking? Authorizing Provider  acetaminophen-codeine (TYLENOL #3) 300-30 MG per tablet Take 1-2 tablets by mouth every 6 (six) hours as needed for moderate pain. 12/17/14   Fayrene HelperBowie Tran, PA-C  albuterol (PROVENTIL HFA;VENTOLIN HFA) 108 (90 BASE) MCG/ACT inhaler Inhale 1-2 puffs into the lungs every 6 (six) hours as needed for wheezing or shortness of breath. 07/11/14   Zadie Rhineonald Wickline, MD  hydrochlorothiazide (HYDRODIURIL) 25 MG tablet Take 1 tablet (25 mg total) by mouth daily. Must have office visit for refills 09/05/15   Raliegh IpAshly M Gottschalk, DO  meloxicam (MOBIC) 15 MG tablet Take 1 tablet (15 mg total) by mouth daily. 12/20/14   Max T Hyatt, DPM  methylPREDNISolone (MEDROL) 4 MG TBPK tablet Tapering 6 day dose pack 12/20/14   Max T Hyatt, DPM  metroNIDAZOLE (FLAGYL) 500 MG tablet Take 1 tablet (500 mg total) by mouth 2 (two) times daily. 06/12/15   Hope Orlene OchM Neese, NP  naproxen (NAPROSYN) 500 MG tablet Take 1 tablet (500 mg total) by mouth 2 (two) times daily as needed for mild pain, moderate pain or headache (TAKE WITH MEALS.). 10/12/14   Mercedes Camprubi-Soms, PA-C   BP 132/68 mmHg  Pulse 85  Temp(Src) 98.1 F (36.7 C) (Oral)  Resp 16  SpO2 100% Physical Exam  Constitutional: She is oriented to person, place, and time. She appears well-developed and well-nourished. No distress.  HENT:  Head: Normocephalic and atraumatic.  Eyes: Conjunctivae are normal. Right eye exhibits no discharge. Left eye exhibits no discharge. No scleral icterus.  Cardiovascular: Normal rate.   Pulmonary/Chest: Effort normal.  Musculoskeletal:  Left knee: Negative anterior/poster drawer bilaterally. Negative ballottement test. No varus or valgus laxity. No crepitus. No pain with flexion or extension. No TTP of ankles.  Neurological: She is alert and oriented to person, place, and time. Coordination normal.  Skin: Skin is warm and dry. No rash noted. She is not diaphoretic. No erythema. No  pallor.  Psychiatric: She has a normal mood and affect. Her behavior is normal.  Nursing note and vitals reviewed.   ED Course  Procedures (including critical care time)  DIAGNOSTIC STUDIES: Oxygen Saturation is 100% on RA, normal by my interpretation.    COORDINATION OF CARE: 1:22 PM - Discussed treatment plan with pt at bedside which includes purchasing shoes with more support. Will wrap knee to provide stability. Will also discharge with Rx ibuprofen 600 mg. Pt verbalized understanding and agreed to plan.   MDM   Final diagnoses:  Left knee pain   Pt presents with knee pain after standing on A concrete floor at her new job she started one week ago. Patient also states she has poor support from her shoes. No trauma or injury. Doubt fracture, no x-rays indicated at this time. Negative ballottement test. No obvious edema. Patient given ACE wrap while in ED, conservative therapy, including ice and elevation, recommended and discussed. Advised pt to purchase shoes with better support. Will discharge with Rx ibuprofen 600 mg. Patient will be dc home & is agreeable with above plan.   I personally performed the services described in this documentation, which was scribed in my presence. The recorded information has been reviewed and is accurate.       Lester Kinsman Mancos, PA-C 10/11/15 2035  Rolan Bucco, MD 10/12/15 [phone]

## 2015-10-11 NOTE — ED Notes (Signed)
Bed: WA30 Expected date:  Expected time:  Means of arrival:  Comments: 

## 2015-10-11 NOTE — ED Notes (Signed)
Pt c/o lt knee pain since last night.  States it is from standing up for long periods of time.  Denies any injury.

## 2015-10-11 NOTE — Discharge Instructions (Signed)
Knee Pain Knee pain is a very common symptom and can have many causes. Knee pain often goes away when you follow your health care provider's instructions for relieving pain and discomfort at home. However, knee pain can develop into a condition that needs treatment. Some conditions may include:  Arthritis caused by wear and tear (osteoarthritis).  Arthritis caused by swelling and irritation (rheumatoid arthritis or gout).  A cyst or growth in your knee.  An infection in your knee joint.  An injury that will not heal.  Damage, swelling, or irritation of the tissues that support your knee (torn ligaments or tendinitis). If your knee pain continues, additional tests may be ordered to diagnose your condition. Tests may include X-rays or other imaging studies of your knee. You may also need to have fluid removed from your knee. Treatment for ongoing knee pain depends on the cause, but treatment may include:  Medicines to relieve pain or swelling.  Steroid injections in your knee.  Physical therapy.  Surgery. HOME CARE INSTRUCTIONS  Take medicines only as directed by your health care provider.  Rest your knee and keep it raised (elevated) while you are resting.  Do not do things that cause or worsen pain.  Avoid high-impact activities or exercises, such as running, jumping rope, or doing jumping jacks.  Apply ice to the knee area:  Put ice in a plastic bag.  Place a towel between your skin and the bag.  Leave the ice on for 20 minutes, 2-3 times a day.  Ask your health care provider if you should wear an elastic knee support.  Keep a pillow under your knee when you sleep.  Lose weight if you are overweight. Extra weight can put pressure on your knee.  Do not use any tobacco products, including cigarettes, chewing tobacco, or electronic cigarettes. If you need help quitting, ask your health care provider. Smoking may slow the healing of any bone and joint problems that you may  have. SEEK MEDICAL CARE IF:  Your knee pain continues, changes, or gets worse.  You have a fever along with knee pain.  Your knee buckles or locks up.  Your knee becomes more swollen. SEEK IMMEDIATE MEDICAL CARE IF:   Your knee joint feels hot to the touch.  You have chest pain or trouble breathing.   This information is not intended to replace advice given to you by your health care provider. Make sure you discuss any questions you have with your health care provider.  Recommend using very supportive shoes while standing at work. Elevate your leg at night as much as possible and apply ice to the affected area. Take Naprosyn or ibuprofen as needed for pain. Wear Ace wrap around knee fracture stability. Return to the ED if you experience severe worsening of your pain, increased redness or swelling around her knee, fevers, chills, chest pain or shortness of breath. Otherwise, he may follow-up with her primary care doctor for this issue.

## 2015-11-17 ENCOUNTER — Ambulatory Visit (INDEPENDENT_AMBULATORY_CARE_PROVIDER_SITE_OTHER): Payer: Self-pay | Admitting: Internal Medicine

## 2015-11-17 ENCOUNTER — Encounter: Payer: Self-pay | Admitting: Internal Medicine

## 2015-11-17 VITALS — BP 141/99 | HR 81 | Temp 98.5°F | Wt 352.4 lb

## 2015-11-17 DIAGNOSIS — I10 Essential (primary) hypertension: Secondary | ICD-10-CM

## 2015-11-17 DIAGNOSIS — J069 Acute upper respiratory infection, unspecified: Secondary | ICD-10-CM

## 2015-11-17 MED ORDER — ALBUTEROL SULFATE HFA 108 (90 BASE) MCG/ACT IN AERS: 1.0000 | INHALATION_SPRAY | Freq: Four times a day (QID) | RESPIRATORY_TRACT | Status: DC | PRN

## 2015-11-17 MED ORDER — HYDROCHLOROTHIAZIDE 25 MG PO TABS: 25.0000 mg | ORAL_TABLET | Freq: Every day | ORAL | Status: DC

## 2015-11-17 NOTE — Assessment & Plan Note (Signed)
Mildly uncontrolled today suspect related to lack of anti-hypertensive medication use for several weeks.  -HCTZ refilled  -patient to return for BP f/u within 1 month

## 2015-11-17 NOTE — Progress Notes (Signed)
   Subjective:    Sara Taylor - 25 y.o. female MRN [id number]  Date of birth: [birth year]  HPI  Sara Taylor is here for HTN follow up. Also reports she needs albuterol refill because has a cold.   Chronic HTN: Has been without HCTZ for about 3-4 weeks. Does not monitor BP at home. Denies CP, vision changes, headaches, and LE edema.   Cold Symptoms: Has had rhinorrhea for several days. Also reports sensation of congestion. Denies SOB but feels that she is wheezing at night when she lays down. Afebrile at home. Has history of asthma but has not used albuterol for over a year.   Health Maintenance Due  Topic Date Due  . PAP SMEAR  03/10/2015    -  reports that she has quit smoking. Her smoking use included Cigarettes. She started smoking about 3 months ago. She smoked 0.20 packs per day. She quit smokeless tobacco use about 3 months ago. - Review of Systems: Per HPI. - Past Medical History: Patient Active Problem List   Diagnosis Date Noted  . URI (upper respiratory infection) 11/17/2015  . Vaginal discharge 03/02/2014  . Preventative health care 06/02/2013  . Screening for STD (sexually transmitted disease) 06/02/2013  . Encounter for long-term (current) use of medications 05/26/2012  . History of chlamydia 05/26/2012  . TOBACCO USE 06/09/2008  . OBESITY, NOS 07/10/2006  . HYPERTENSION, BENIGN SYSTEMIC 07/10/2006  . RHINITIS, ALLERGIC 07/10/2006   - Medications: reviewed and updated    Objective:   Physical Exam BP 141/99 mmHg  Pulse 81  Temp(Src) 98.5 F (36.9 C) (Oral)  Wt 352 lb 6.4 oz (159.848 kg)  LMP 06/20/2015 (Approximate) Gen: NAD, alert, cooperative with exam, well-appearing, morbidly obese  HEENT: Nasal drainage present. Oropharynx without cobblestoning. Post-nasal drip present.  CV: RRR, good S1/S2, no murmur, no edema, capillary refill brisk  Resp: CTABL, no wheezes, non-labored     Assessment & Plan:   HYPERTENSION, BENIGN SYSTEMIC Mildly  uncontrolled today suspect related to lack of anti-hypertensive medication use for several weeks.  -HCTZ refilled  -patient to return for BP f/u within 1 month   URI (upper respiratory infection) Fairly benign exam with clear lungs and no wheezing present on exam. Suspect patient has mild URI. Patient with history of asthma although sounds very mild given lack of albuterol use for over a year. Potential that patient had childhood asthma that she has grown out of. No documented PFTs on file.  -albuterol inhaler refilled due to sensation of wheezing at night  -supportive care and return precautions discussed  -would consider PFTs in future if further episodes of respiratory complaints    Patient overdue for PAP. Instructed patient to make return appointment within the next month for PAP and blood pressure re-check.    Marcy Sirenatherine Nahomy Limburg, D.O. 11/17/2015, 10:59 AM PGY-2, [facility]

## 2015-11-17 NOTE — Patient Instructions (Signed)
I have refilled your blood pressure medicine (HCTZ) and your albuterol inhaler. I hope you feel better soon! You can try nasal saline spray and Mucinex to help with congestion as well.   You are overdue for a PAP. Please schedule an appointment with Dr. Nadine CountsGottschalk in the next month to have that completed and to follow up on your blood pressure.

## 2015-11-17 NOTE — Assessment & Plan Note (Signed)
Fairly benign exam with clear lungs and no wheezing present on exam. Suspect patient has mild URI. Patient with history of asthma although sounds very mild given lack of albuterol use for over a year. Potential that patient had childhood asthma that she has grown out of. No documented PFTs on file.  -albuterol inhaler refilled due to sensation of wheezing at night  -supportive care and return precautions discussed  -would consider PFTs in future if further episodes of respiratory complaints

## 2015-12-04 ENCOUNTER — Encounter (HOSPITAL_COMMUNITY): Payer: Self-pay | Admitting: Emergency Medicine

## 2015-12-04 ENCOUNTER — Emergency Department (HOSPITAL_COMMUNITY)
Admission: EM | Admit: 2015-12-04 | Discharge: 2015-12-04 | Disposition: A | Discharge status: Home / Self Care | Payer: Self-pay | Attending: Emergency Medicine | Admitting: Emergency Medicine

## 2015-12-04 DIAGNOSIS — J029 Acute pharyngitis, unspecified: Secondary | ICD-10-CM | POA: Insufficient documentation

## 2015-12-04 DIAGNOSIS — I10 Essential (primary) hypertension: Secondary | ICD-10-CM | POA: Insufficient documentation

## 2015-12-04 DIAGNOSIS — Z87891 Personal history of nicotine dependence: Secondary | ICD-10-CM | POA: Insufficient documentation

## 2015-12-04 LAB — RAPID STREP SCREEN (MED CTR MEBANE ONLY): Streptococcus, Group A Screen (Direct): NEGATIVE

## 2015-12-04 MED ORDER — IBUPROFEN 400 MG PO TABS
600.0000 mg | ORAL_TABLET | Freq: Once | ORAL | Status: AC
  Administered 2015-12-04: 600 mg via ORAL
  Filled 2015-12-04: qty 1

## 2015-12-04 MED ORDER — DEXAMETHASONE 4 MG PO TABS
10.0000 mg | ORAL_TABLET | Freq: Once | ORAL | Status: AC
  Administered 2015-12-04: 10 mg via ORAL
  Filled 2015-12-04: qty 3

## 2015-12-04 NOTE — ED Provider Notes (Signed)
[facility]-EMERGENCY DEPT Provider Note   CSN: [id number] Arrival date & time: 12/04/15  1717  By signing my name below, I, Phillis Haggis, attest that this documentation has been prepared under the direction and in the presence of Buel Ream, PA-C. Electronically Signed: Phillis Haggis, ED Scribe. 12/04/15. 9:21 PM.   First Provider Contact:  None       History   Chief Complaint Chief Complaint  Patient presents with  . Sore Throat   The history is provided by the patient. No language interpreter was used.  HPI Comments: Sara Taylor is a 25 y.o. Female with a hx of HTN who presents to the Emergency Department complaining of gradually worsening sore throat onset 3 days ago. She reports that her throat feel dry. She states that she had a URI two weeks ago and the rest of her symptoms are resolving. She believes that the sore throat is "my cold leaving me." She reports associated gradually resolving cough. She has been taking Tussin for her cough, but has not tried anything for her sore throat symptoms. She denies fever, chills, otalgia, congestion, drooling, chest pain, SOB, nausea, vomiting, dysuria, hematuria, or headaches.   Past Medical History:  Diagnosis Date  . Hypertension   . Ovarian cyst     Patient Active Problem List   Diagnosis Date Noted  . URI (upper respiratory infection) 11/17/2015  . Vaginal discharge 03/02/2014  . Preventative health care 06/02/2013  . Screening for STD (sexually transmitted disease) 06/02/2013  . Encounter for long-term (current) use of medications 05/26/2012  . History of chlamydia 05/26/2012  . TOBACCO USE 06/09/2008  . OBESITY, NOS 07/10/2006  . HYPERTENSION, BENIGN SYSTEMIC 07/10/2006  . RHINITIS, ALLERGIC 07/10/2006    History reviewed. No pertinent surgical history.  OB History    No data available       Home Medications    Prior to Admission medications   Medication Sig Start Date End Date Taking? Authorizing  Provider  acetaminophen-codeine (TYLENOL #3) 300-30 MG per tablet Take 1-2 tablets by mouth every 6 (six) hours as needed for moderate pain. 12/17/14   Fayrene Helper, PA-C  albuterol (PROVENTIL HFA;VENTOLIN HFA) 108 (90 Base) MCG/ACT inhaler Inhale 1-2 puffs into the lungs every 6 (six) hours as needed for wheezing or shortness of breath. 11/17/15   Arvilla Market, DO  hydrochlorothiazide (HYDRODIURIL) 25 MG tablet Take 1 tablet (25 mg total) by mouth daily. Must have office visit for refills 11/17/15   Arvilla Market, DO  meloxicam (MOBIC) 15 MG tablet Take 1 tablet (15 mg total) by mouth daily. 12/20/14   Max T Hyatt, DPM  methylPREDNISolone (MEDROL) 4 MG TBPK tablet Tapering 6 day dose pack 12/20/14   Max T Hyatt, DPM  metroNIDAZOLE (FLAGYL) 500 MG tablet Take 1 tablet (500 mg total) by mouth 2 (two) times daily. 06/12/15   Hope Orlene Och, NP  naproxen (NAPROSYN) 500 MG tablet Take 1 tablet (500 mg total) by mouth 2 (two) times daily as needed for mild pain, moderate pain or headache (TAKE WITH MEALS.). 10/11/15   Samantha Tripp Dowless, PA-C    Family History Family History  Problem Relation Age of Onset  . Hypertension Maternal Grandmother   . Hypertension Maternal Grandfather   . Hypertension Paternal Grandmother   . Diabetes Paternal Grandmother     Social History Social History  Substance Use Topics  . Smoking status: Former Smoker    Packs/day: 0.20    Types: Cigarettes  Start date: 08/08/2015  . Smokeless tobacco: Former Neurosurgeon    Quit date: 08/08/2015  . Alcohol use 0.0 oz/week     Comment: occ     Allergies   Doxycycline   Review of Systems Review of Systems  Constitutional: Negative for chills and fever.  HENT: Positive for sore throat. Negative for congestion, drooling, ear pain and facial swelling.   Respiratory: Positive for cough. Negative for shortness of breath.   Cardiovascular: Negative for chest pain.  Gastrointestinal: Negative for abdominal pain,  nausea and vomiting.  Genitourinary: Negative for dysuria.  Musculoskeletal: Negative for back pain.  Skin: Negative for rash and wound.  Neurological: Negative for headaches.  Psychiatric/Behavioral: The patient is not nervous/anxious.      Physical Exam Updated Vital Signs BP 104/83 (BP Location: Left Arm)   Pulse 79   Temp 97.8 F (36.6 C) (Oral)   Resp 18   Ht  (1.651 m)   Wt (!) 149.7 kg   LMP 06/20/2015 (Approximate)   SpO2 100%   BMI 54.91 kg/m   Physical Exam  Constitutional: She appears well-developed and well-nourished. No distress.  HENT:  Head: Normocephalic and atraumatic.  Mouth/Throat: Uvula is midline and mucous membranes are normal. No trismus in the jaw. Oropharyngeal exudate, posterior oropharyngeal edema and posterior oropharyngeal erythema present. No tonsillar abscesses.  Eyes: Conjunctivae are normal. Pupils are equal, round, and reactive to light. Right eye exhibits no discharge. Left eye exhibits no discharge. No scleral icterus.  Neck: Normal range of motion. Neck supple. No thyromegaly present.  Cardiovascular: Normal rate, regular rhythm and normal heart sounds.  Exam reveals no gallop and no friction rub.   No murmur heard. Pulmonary/Chest: Effort normal and breath sounds normal. No stridor. No respiratory distress. She has no wheezes. She has no rales.  Abdominal: Soft. Bowel sounds are normal. She exhibits no distension. There is no tenderness. There is no rebound and no guarding.  Musculoskeletal: She exhibits no edema.  Lymphadenopathy:    She has no cervical adenopathy.  Neurological: She is alert. Coordination normal.  Skin: Skin is warm and dry. No rash noted. She is not diaphoretic. No pallor.  Psychiatric: She has a normal mood and affect.  Nursing note and vitals reviewed.    ED Treatments / Results  DIAGNOSTIC STUDIES: Oxygen Saturation is 99% on RA, normal by my interpretation.    COORDINATION OF CARE: 8:55 PM-Discussed  treatment plan which includes steroids, anti-inflammatories, and salt water rinses with pt at bedside and pt agreed to plan.    Labs (all labs ordered are listed, but only abnormal results are displayed) Labs Reviewed  RAPID STREP SCREEN (NOT AT [facility])  CULTURE, GROUP A STREP [facility])    EKG  EKG Interpretation None       Radiology No results found.  Procedures Procedures (including critical care time)  Medications Ordered in ED Medications  dexamethasone (DECADRON) tablet 10 mg (not administered)  ibuprofen (ADVIL,MOTRIN) tablet 600 mg (not administered)     Initial Impression / Assessment and Plan / ED Course  I have reviewed the triage vital signs and the nursing notes.  Pertinent labs & imaging results that were available during my care of the patient were reviewed by me and considered in my medical decision making (see chart for details).  Clinical Course    Patient given Decadron 10 mg and ibuprofen 600 mg in ED.  Final Clinical Impressions(s) / ED Diagnoses   Pt with negative strep. Diagnosis of viral  pharyngitis. No abx indicated at this time. Discussed that results of strep culture are pending and patient will be informed if positive result and abx will be called in at that time. Discharge with symptomatic tx including warm salt water gargles, ibuprofen. No evidence of dehydration. Pt is tolerating secretions. Presentation not concerning for peritonsillar abscess or spread of infection to deep spaces of the throat; patent airway. Specific return precautions discussed. Recommended PCP follow up. Pt vitals stable throughout ED course and appears safe for discharge.   Final diagnoses:  Pharyngitis   I personally performed the services described in this documentation, which was scribed in my presence. The recorded information has been reviewed and is accurate.    New Prescriptions New Prescriptions   No medications on file     Verdis Prime 12/04/15  2122    Laurence Spates, MD 12/15/15 [phone]

## 2015-12-04 NOTE — Discharge Instructions (Signed)
Take ibuprofen every 4-6 hours as needed for your pain. Use warm saltwater gargles 3-4 times daily. Please return to the emergency department if you develop any worsening pain, inability to open your mouth, pain on one side of your throat versus the other, neck pain, drooling, or any other concerning symptoms. Please follow-up with your primary care provider in 3-4 days for follow-up of today's visit and recheck of your symptoms.

## 2015-12-04 NOTE — ED Triage Notes (Signed)
Pt sts sore throat and 3 days; pt denies fever

## 2015-12-08 LAB — CULTURE, GROUP A STREP (THRC)

## 2016-01-03 ENCOUNTER — Ambulatory Visit (INDEPENDENT_AMBULATORY_CARE_PROVIDER_SITE_OTHER): Payer: Self-pay | Admitting: Family Medicine

## 2016-01-03 ENCOUNTER — Other Ambulatory Visit (HOSPITAL_COMMUNITY)
Admission: RE | Admit: 2016-01-03 | Discharge: 2016-01-03 | Disposition: A | Discharge status: Home / Self Care | Payer: Self-pay | Source: Ambulatory Visit | Attending: Family Medicine | Admitting: Family Medicine

## 2016-01-03 ENCOUNTER — Encounter: Payer: Self-pay | Admitting: Family Medicine

## 2016-01-03 VITALS — BP 148/86 | HR 88 | Temp 98.4°F | Ht 65.0 in | Wt 356.8 lb

## 2016-01-03 DIAGNOSIS — I10 Essential (primary) hypertension: Secondary | ICD-10-CM

## 2016-01-03 DIAGNOSIS — Z01419 Encounter for gynecological examination (general) (routine) without abnormal findings: Secondary | ICD-10-CM

## 2016-01-03 DIAGNOSIS — J4520 Mild intermittent asthma, uncomplicated: Secondary | ICD-10-CM

## 2016-01-03 DIAGNOSIS — R519 Headache, unspecified: Secondary | ICD-10-CM

## 2016-01-03 DIAGNOSIS — Z113 Encounter for screening for infections with a predominantly sexual mode of transmission: Secondary | ICD-10-CM | POA: Insufficient documentation

## 2016-01-03 DIAGNOSIS — Z124 Encounter for screening for malignant neoplasm of cervix: Secondary | ICD-10-CM

## 2016-01-03 DIAGNOSIS — E669 Obesity, unspecified: Secondary | ICD-10-CM

## 2016-01-03 DIAGNOSIS — R51 Headache: Secondary | ICD-10-CM

## 2016-01-03 DIAGNOSIS — J45909 Unspecified asthma, uncomplicated: Secondary | ICD-10-CM | POA: Insufficient documentation

## 2016-01-03 LAB — BASIC METABOLIC PANEL WITH GFR
BUN: 7 mg/dL (ref 7–25)
CHLORIDE: 105 mmol/L (ref 98–110)
CO2: 25 mmol/L (ref 20–31)
CREATININE: 0.81 mg/dL (ref 0.50–1.10)
Calcium: 8.6 mg/dL (ref 8.6–10.2)
GFR, Est Non African American: >89 mL/min (ref >=60)
Glucose, Bld: 116 mg/dL — ABNORMAL HIGH (ref 65–99)
POTASSIUM: 3.7 mmol/L (ref 3.5–5.3)
SODIUM: 138 mmol/L (ref 135–146)

## 2016-01-03 LAB — TSH: TSH: 0.92 m[IU]/L

## 2016-01-03 MED ORDER — BECLOMETHASONE DIPROPIONATE 80 MCG/ACT IN AERS: 2.0000 | INHALATION_SPRAY | Freq: Two times a day (BID) | RESPIRATORY_TRACT | 12 refills | Status: DC

## 2016-01-03 MED ORDER — ALBUTEROL SULFATE HFA 108 (90 BASE) MCG/ACT IN AERS: 1.0000 | INHALATION_SPRAY | Freq: Four times a day (QID) | RESPIRATORY_TRACT | 2 refills | Status: DC | PRN

## 2016-01-03 MED ORDER — MELOXICAM 15 MG PO TABS: 15.0000 mg | ORAL_TABLET | Freq: Every day | ORAL | 1 refills | Status: DC | PRN

## 2016-01-03 MED ORDER — BECLOMETHASONE DIPROPIONATE 80 MCG/ACT IN AERS: 1.0000 | INHALATION_SPRAY | Freq: Two times a day (BID) | RESPIRATORY_TRACT | 12 refills | Status: DC

## 2016-01-03 MED ORDER — HYDROCHLOROTHIAZIDE 25 MG PO TABS: 25.0000 mg | ORAL_TABLET | Freq: Every day | ORAL | 12 refills | Status: DC

## 2016-01-03 MED ORDER — ACETAMINOPHEN 500 MG PO TABS
500.0000 mg | ORAL_TABLET | Freq: Once | ORAL | Status: AC
Start: 2016-01-03 — End: 2016-01-03
  Administered 2016-01-03: 500 mg via ORAL

## 2016-01-03 NOTE — Assessment & Plan Note (Signed)
Self reported.  Notes that she needs Albuterol daily.  Will start Qvar 80 BID, discussed Albuterol to be used PRN.

## 2016-01-03 NOTE — Progress Notes (Signed)
Sara LandsbergJasmine L Taylor is a 25 y.o. female presents to office today for annual physical exam examination.  Concerns today include:  Last eye exam: >1 year Last dental exam: >1 year Last pap smear: 2013, normal. Immunizations needed: declines flu shot Refills needed today: hctz, mobic  Past Medical History:  Diagnosis Date  . Asthma   . Depression   . Hypertension   . Ovarian cyst    Social History   Social History  . Marital status: Single    Spouse name: N/A  . Number of children: N/A  . Years of education: N/A   Occupational History  . Not on file.   Social History Main Topics  . Smoking status: Former Smoker    Packs/day: 0.20    Types: Cigarettes    Start date: 08/08/2015    Quit date: 09/2015  . Smokeless tobacco: Former NeurosurgeonUser    Quit date: 08/08/2015  . Alcohol use 0.0 oz/week     Comment: occ  . Drug use: No  . Sexual activity: Yes   Other Topics Concern  . Not on file   Social History Narrative  . No narrative on file   No past surgical history on file. Family History  Problem Relation Age of Onset  . Hypertension Maternal Grandmother   . Hypertension Maternal Grandfather   . Hypertension Paternal Grandmother   . Diabetes Paternal Grandmother   . Hypertension Mother   . Diabetes Mother   . Hypothyroidism Mother     ROS: Review of Systems Constitutional: negative Eyes: negative Ears, nose, mouth, throat, and face: negative Respiratory: positive for asthma Cardiovascular: negative Gastrointestinal: negative Genitourinary:positive for irregular menstrual cycles. no abnormal vaginal discharge Integument/breast: positive for abnormal hair growth, acne Hematologic/lymphatic: negative Musculoskeletal:positive for occ right ankle pain Neurological: negative Behavioral/Psych: positive for depression, is controlled Endocrine: negative Allergic/Immunologic: negative    Physical exam BP (!) 148/86 (BP Location: Right Arm, Patient Position: Sitting,  Cuff Size: Large)   Pulse 88   Temp 98.4 F (36.9 C) (Oral)   Ht 5\' 5"  (1.651 m)   Wt (!) 356 lb 12.8 oz (161.8 kg)   LMP 11/24/2015 (Exact Date)   BMI 59.37 kg/m  General appearance: alert, cooperative, appears stated age and morbidly obese Head: Normocephalic, without obvious abnormality, atraumatic Eyes: negative findings: lids and lashes normal, conjunctivae and sclerae normal, corneas clear and pupils equal, round, reactive to light and accomodation Ears: normal TM's and external ear canals both ears Nose: Nares normal. Septum midline. Mucosa normal. No drainage or sinus tenderness. Throat: lips, mucosa, and tongue normal; teeth and gums normal Neck: no adenopathy, supple, symmetrical, trachea midline and thyroid not enlarged, symmetric, no tenderness/mass/nodules Back: symmetric, no curvature. ROM normal. No CVA tenderness. Lungs: clear to auscultation bilaterally and no wheeze Heart: regular rate and rhythm, S1, S2 normal, no murmur, click, rub or gallop Abdomen: obese, soft, NT/ND, +BS Pelvic: external genitalia normal, no adnexal masses or tenderness, no cervical motion tenderness, rectovaginal septum normal, uterus normal size, shape, and consistency and scant white discharge appreciated in vaginal vault, cervix posterior facing.  Difficult to see os as vaginal walls are obstructing view. Extremities: extremities normal, atraumatic, no cyanosis or edema Pulses: 2+ and symmetric Skin: acne and hair appreciated on chin Lymph nodes: Cervical, supraclavicular, and axillary nodes normal. Neurologic: Alert and oriented X 3, normal strength and tone. Normal symmetric reflexes. Normal coordination and gait    Assessment/ Plan: Sara LandsbergJasmine L Navratil here for annual physical exam.  1. Well woman exam with routine gynecological exam - pap obtained w/ GC/CT screen - declined flu shot - labs obtained - patient to work on lifestyle modifications  2. Screening for cervical cancer -  Cytology - PAP  HYPERTENSION, BENIGN SYSTEMIC BP not at goal today.  Unfortunately not rechecked prior to discharge.  Patient to return in next couple of weeks.  Will plan to augment antihypertensives at that appt if needed.  Will also consider sending for sleep study given body habitus. ?OHS vs OSA  Asthma Self reported.  Notes that she needs Albuterol daily.  Will start Qvar 80 BID, discussed Albuterol to be used PRN.  OBESITY, NOS Discussed evaluating for PCOS.  Has abnormal hair growth, acne and is obese.  Metabolic labs obtained today.  Patient does not seem amenable to OCPs at this time but may benefit from Metformin.  Headache, unspecified headache type - Resolved with Tylenol. - acetaminophen (TYLENOL) tablet 500 mg; Take 1 tablet (500 mg total) by mouth once.  Follow up in 2 weeks for PCOS discussion, repeat BP.  Jenniferann Stuckert M. Nadine CountsGottschalk, DO PGY-3, [facility]

## 2016-01-03 NOTE — Assessment & Plan Note (Signed)
BP not at goal today.  Unfortunately not rechecked prior to discharge.  Patient to return in next couple of weeks.  Will plan to augment antihypertensives at that appt if needed.  Will also consider sending for sleep study given body habitus. ?OHS vs OSA

## 2016-01-03 NOTE — Assessment & Plan Note (Signed)
Discussed evaluating for PCOS.  Has abnormal hair growth, acne and is obese.  Metabolic labs obtained today.  Patient does not seem amenable to OCPs at this time but may benefit from Metformin.

## 2016-01-03 NOTE — Patient Instructions (Addendum)
I will contact you will the results of your labs.  If anything is abnormal, I will call you.  Otherwise, expect a copy to be mailed to you.  I have prescribed Qvar for your asthma.  Take this twice daily as directed.  Follow up in 1 month for discussion about PCOS.

## 2016-01-04 LAB — VITAMIN D 25 HYDROXY (VIT D DEFICIENCY, FRACTURES): VIT D 25 HYDROXY: 22 ng/mL — AB (ref 30–100)

## 2016-01-05 ENCOUNTER — Telehealth: Payer: Self-pay | Admitting: Family Medicine

## 2016-01-05 ENCOUNTER — Encounter: Payer: Self-pay | Admitting: Family Medicine

## 2016-01-05 DIAGNOSIS — N760 Acute vaginitis: Principal | ICD-10-CM

## 2016-01-05 DIAGNOSIS — B9689 Other specified bacterial agents as the cause of diseases classified elsewhere: Secondary | ICD-10-CM

## 2016-01-05 LAB — CYTOLOGY - PAP

## 2016-01-05 MED ORDER — METRONIDAZOLE 500 MG PO TABS: 500.0000 mg | ORAL_TABLET | Freq: Two times a day (BID) | ORAL | 0 refills | Status: DC

## 2016-01-05 NOTE — Telephone Encounter (Signed)
Attempted to call patient re: lab results.  Blood glucose elevated.  Unsure if patient was fasting for labs.  If so, I'd like to have her back in 1 month for recheck of glucose.  May need to start medication.  Encourage decreased sugar/ carb intake.  Additionally, pap within normal limits.  Finding suggestive of BV.  Will send in Metronidazole for 1 week.  No alcohol while taking medication.  Will send letter.  ,Ashly M. Nadine CountsGottschalk, DO PGY-3, [facility]

## 2016-02-25 ENCOUNTER — Encounter (HOSPITAL_COMMUNITY): Payer: Self-pay

## 2016-02-25 ENCOUNTER — Emergency Department (HOSPITAL_COMMUNITY): Payer: Self-pay

## 2016-02-25 ENCOUNTER — Emergency Department (HOSPITAL_COMMUNITY)
Admission: EM | Admit: 2016-02-25 | Discharge: 2016-02-25 | Disposition: A | Discharge status: Home / Self Care | Payer: Self-pay | Attending: Emergency Medicine | Admitting: Emergency Medicine

## 2016-02-25 DIAGNOSIS — M25571 Pain in right ankle and joints of right foot: Secondary | ICD-10-CM | POA: Insufficient documentation

## 2016-02-25 DIAGNOSIS — J45909 Unspecified asthma, uncomplicated: Secondary | ICD-10-CM | POA: Insufficient documentation

## 2016-02-25 DIAGNOSIS — I10 Essential (primary) hypertension: Secondary | ICD-10-CM | POA: Insufficient documentation

## 2016-02-25 DIAGNOSIS — Z87891 Personal history of nicotine dependence: Secondary | ICD-10-CM | POA: Insufficient documentation

## 2016-02-25 DIAGNOSIS — Z79899 Other long term (current) drug therapy: Secondary | ICD-10-CM | POA: Insufficient documentation

## 2016-02-25 MED ORDER — NAPROXEN 500 MG PO TABS: 500.0000 mg | ORAL_TABLET | Freq: Two times a day (BID) | ORAL | 0 refills | Status: DC

## 2016-02-25 NOTE — ED Triage Notes (Signed)
Pt complaining of R ankle swelling. Pt states woke up with swelling today. Denies any injury or trauma. Pt state hx of same. Pt denies any hx of CHF. Pt states seen by ortho for ankle.

## 2016-02-25 NOTE — ED Provider Notes (Signed)
[facility]PT Provider Note   CSN: [id number] Arrival date & time: 02/25/16  1453     History   Chief Complaint Chief Complaint  Patient presents with  . Ankle Pain   The history is provided by the patient and medical records. No language interpreter was used.    HPI Comments:  Sara Taylor is a morbidly obese 25 y.o. female who presents to the Emergency Department complaining of right ankle swelling that she began last night. She states she had similar issues with the ankle last year. She states she saw an orthopedist for the right ankle last year and was told she had tendonitis and was prescribed antiinflammatory medication and a brace. She has not done anything to treat the ankle. She denies modifying factors. She denies any injury or trauma. She denies numbness, tingling or weakness of the right ankle or RLE, bruising, wounds.    Past Medical History:  Diagnosis Date  . Asthma   . Depression   . Hypertension   . Ovarian cyst     Patient Active Problem List   Diagnosis Date Noted  . Asthma 01/03/2016  . URI (upper respiratory infection) 11/17/2015  . Vaginal discharge 03/02/2014  . Preventative health care 06/02/2013  . Screening for STD (sexually transmitted disease) 06/02/2013  . Encounter for long-term (current) use of medications 05/26/2012  . History of chlamydia 05/26/2012  . TOBACCO USE 06/09/2008  . OBESITY, NOS 07/10/2006  . HYPERTENSION, BENIGN SYSTEMIC 07/10/2006  . RHINITIS, ALLERGIC 07/10/2006    History reviewed. No pertinent surgical history.  OB History    No data available       Home Medications    Prior to Admission medications   Medication Sig Start Date End Date Taking? Authorizing Provider  albuterol (PROVENTIL HFA;VENTOLIN HFA) 108 (90 Base) MCG/ACT inhaler Inhale 1-2 puffs into the lungs every 6 (six) hours as needed for wheezing or shortness of breath. 01/03/16   Raliegh Ip, DO  beclomethasone (QVAR) 80 MCG/ACT  inhaler Inhale 1 puff into the lungs 2 (two) times daily. 01/03/16   Ashly Hulen Skains, DO  hydrochlorothiazide (HYDRODIURIL) 25 MG tablet Take 1 tablet (25 mg total) by mouth daily. Must have office visit for refills 01/03/16   Raliegh Ip, DO  meloxicam (MOBIC) 15 MG tablet Take 1 tablet (15 mg total) by mouth daily as needed for pain. 01/03/16   Ashly Hulen Skains, DO  metroNIDAZOLE (FLAGYL) 500 MG tablet Take 1 tablet (500 mg total) by mouth 2 (two) times daily. 01/05/16   Raliegh Ip, DO  naproxen (NAPROSYN) 500 MG tablet Take 1 tablet (500 mg total) by mouth 2 (two) times daily with a meal. 02/25/16   Everlene Farrier, PA-C    Family History Family History  Problem Relation Age of Onset  . Hypertension Maternal Grandmother   . Hypertension Maternal Grandfather   . Hypertension Paternal Grandmother   . Diabetes Paternal Grandmother   . Hypertension Mother   . Diabetes Mother   . Hypothyroidism Mother     Social History Social History  Substance Use Topics  . Smoking status: Former Smoker    Packs/day: 0.20    Types: Cigarettes    Start date: 08/08/2015    Quit date: 09/2015  . Smokeless tobacco: Former Neurosurgeon    Quit date: 08/08/2015  . Alcohol use 0.0 oz/week     Comment: occ     Allergies   Doxycycline   Review of Systems Review of Systems  Constitutional: Negative for fever.  Cardiovascular: Negative for leg swelling.  Musculoskeletal: Positive for arthralgias and joint swelling.  Skin: Negative for color change and wound.  Neurological: Negative for weakness and numbness.     Physical Exam Updated Vital Signs BP 133/99 (BP Location: Left Arm)   Pulse 98   Temp 98.6 F (37 C) (Oral)   Resp 17   Ht 5\' 5"  (1.651 m)   Wt (!) 320 lb (145.2 kg)   LMP 01/18/2016 Comment: irregular periods / shielded pt.  SpO2 99%   BMI 53.25 kg/m   Physical Exam  Constitutional: She appears well-developed and well-nourished. No distress.  HENT:  Head:  Normocephalic and atraumatic.  Eyes: Right eye exhibits no discharge. Left eye exhibits no discharge.  Cardiovascular: Normal rate, regular rhythm and intact distal pulses.   Bilateral DP and PT pulses intact.  Pulmonary/Chest: Effort normal. No respiratory distress.  Musculoskeletal: She exhibits edema and tenderness. She exhibits no deformity.  Mild diffuse right ankle edema. Tenderness diffusely to right ankle. No focal tenderness. No ankle laxity. No calf edema or tenderness.  Neurological: She is alert. Coordination normal.  Skin: Skin is warm and dry. Capillary refill takes less than 2 seconds. No rash noted. She is not diaphoretic. No erythema. No pallor.  Psychiatric: She has a normal mood and affect. Her behavior is normal.  Nursing note and vitals reviewed.    ED Treatments / Results  DIAGNOSTIC STUDIES: Oxygen Saturation is 99% on RA, normal by my interpretation.   COORDINATION OF CARE: 5:20 PM- Informed pt of normal X-Ray. Will provide brace for the right ankle and refer to sports medicine and/or advised pt to follow up with orthopedist she last saw. Pt verbalizes understanding and agrees to plan.  Medications - No data to display   Labs (all labs ordered are listed, but only abnormal results are displayed) Labs Reviewed - No data to display  EKG  EKG Interpretation None       Radiology Dg Ankle Complete Right  Result Date: 02/25/2016 CLINICAL DATA:  Chronic ankle pain, worse with standing. EXAM: RIGHT ANKLE - COMPLETE 3+ VIEW COMPARISON:  Left ankle radiographs 12/17/2014. FINDINGS: Soft tissue swelling is more prominent over the lateral malleolus. There is no underlying fracture. The joint is located. No focal osseous abnormality is present. IMPRESSION: 1. Soft tissue swelling predominantly over the lateral malleolus without acute or focal osseous abnormality. Electronically Signed   By: Marin Roberts M.D.   On: 02/25/2016 16:37    Procedures Procedures  (including critical care time)  Medications Ordered in ED Medications - No data to display   Initial Impression / Assessment and Plan / ED Course  I have reviewed the triage vital signs and the nursing notes.  Pertinent labs & imaging results that were available during my care of the patient were reviewed by me and considered in my medical decision making (see chart for details).  Clinical Course   This is a morbidly obese 25 y.o. female who presents to the Emergency Department complaining of right ankle swelling that she began last night. She states she had similar issues with the ankle last year. She states she saw an orthopedist for the right ankle last year and was told she had tendonitis and was prescribed antiinflammatory medication and a brace. She has not done anything to treat the ankle. She denies modifying factors. She denies any injury or trauma. On exam the patient is afebrile nontoxic appearing. She has mild diffuse right  ankle edema without focal tenderness. X-ray is remarkable only for soft tissue edema. No ankle laxity noted. She is previously had Achilles tendinitis. Will provide with an ASO ankle brace and encouraged her to start naproxen for pain control. I encouraged her to follow-up with her orthopedic surgeon. Patient agrees to plan. I discussed return precautions. I advised the patient to follow-up with their primary care provider this week. I advised the patient to return to the emergency department with new or worsening symptoms or new concerns. The patient verbalized understanding and agreement with plan.      Final Clinical Impressions(s) / ED Diagnoses   Final diagnoses:  Acute right ankle pain    New Prescriptions New Prescriptions   NAPROXEN (NAPROSYN) 500 MG TABLET    Take 1 tablet (500 mg total) by mouth 2 (two) times daily with a meal.   I personally performed the services described in this documentation, which was scribed in my presence. The recorded  information has been reviewed and is accurate.        Everlene FarrierWilliam Keaton Beichner, PA-C 02/25/16 1729    Charlynne Panderavid Hsienta Yao, MD 02/26/16 [phone]

## 2016-08-04 ENCOUNTER — Ambulatory Visit (HOSPITAL_COMMUNITY)
Admission: EM | Admit: 2016-08-04 | Discharge: 2016-08-04 | Disposition: A | Discharge status: Home / Self Care | Payer: Self-pay | Attending: Family Medicine | Admitting: Family Medicine

## 2016-08-04 ENCOUNTER — Encounter (HOSPITAL_COMMUNITY): Payer: Self-pay | Admitting: Emergency Medicine

## 2016-08-04 DIAGNOSIS — J45909 Unspecified asthma, uncomplicated: Secondary | ICD-10-CM | POA: Insufficient documentation

## 2016-08-04 DIAGNOSIS — Z833 Family history of diabetes mellitus: Secondary | ICD-10-CM | POA: Insufficient documentation

## 2016-08-04 DIAGNOSIS — Z79899 Other long term (current) drug therapy: Secondary | ICD-10-CM | POA: Insufficient documentation

## 2016-08-04 DIAGNOSIS — N760 Acute vaginitis: Secondary | ICD-10-CM

## 2016-08-04 DIAGNOSIS — Z87891 Personal history of nicotine dependence: Secondary | ICD-10-CM | POA: Insufficient documentation

## 2016-08-04 DIAGNOSIS — Z8249 Family history of ischemic heart disease and other diseases of the circulatory system: Secondary | ICD-10-CM | POA: Insufficient documentation

## 2016-08-04 DIAGNOSIS — Z888 Allergy status to other drugs, medicaments and biological substances status: Secondary | ICD-10-CM | POA: Insufficient documentation

## 2016-08-04 DIAGNOSIS — I10 Essential (primary) hypertension: Secondary | ICD-10-CM | POA: Insufficient documentation

## 2016-08-04 DIAGNOSIS — N83209 Unspecified ovarian cyst, unspecified side: Secondary | ICD-10-CM | POA: Insufficient documentation

## 2016-08-04 DIAGNOSIS — B9689 Other specified bacterial agents as the cause of diseases classified elsewhere: Secondary | ICD-10-CM

## 2016-08-04 DIAGNOSIS — F329 Major depressive disorder, single episode, unspecified: Secondary | ICD-10-CM | POA: Insufficient documentation

## 2016-08-04 LAB — POCT URINALYSIS DIP (DEVICE)
BILIRUBIN URINE: NEGATIVE
Glucose, UA: NEGATIVE mg/dL
HGB URINE DIPSTICK: NEGATIVE
KETONES UR: NEGATIVE mg/dL
Nitrite: NEGATIVE
PH: 6.5 (ref 5.0–8.0)
Protein, ur: NEGATIVE mg/dL
Specific Gravity, Urine: 1.02 (ref 1.005–1.030)
Urobilinogen, UA: 0.2 mg/dL (ref 0.0–1.0)

## 2016-08-04 LAB — POCT PREGNANCY, URINE: Preg Test, Ur: NEGATIVE

## 2016-08-04 MED ORDER — METRONIDAZOLE 500 MG PO TABS: 500.0000 mg | ORAL_TABLET | Freq: Two times a day (BID) | ORAL | 0 refills | Status: DC

## 2016-08-04 NOTE — ED Provider Notes (Signed)
CSN: [id number]     Arrival date & time 08/04/16  1448 History   None    Chief Complaint  Patient presents with  . Vaginal Itching   (Consider location/radiation/quality/duration/timing/severity/associated sxs/prior Treatment)  HPI   The patient is a 26 year old female presenting today with vaginal itching and discomfort for approximately one week. Patient reports she has a frequent history of yeast infections.  In addition, the patient is concerned about vaginal spotting between normal menses. Patient reports she has irregular menses and is no longer taking oral birth control pills to regulate them. Patient states she has not had a normal cycle since last fall. Was tested for STDs at the health department approximately 2 weeks ago  Past Medical History:  Diagnosis Date  . Asthma   . Depression   . Hypertension   . Ovarian cyst    History reviewed. No pertinent surgical history. Family History  Problem Relation Age of Onset  . Hypertension Maternal Grandmother   . Hypertension Maternal Grandfather   . Hypertension Paternal Grandmother   . Diabetes Paternal Grandmother   . Hypertension Mother   . Diabetes Mother   . Hypothyroidism Mother    Social History  Substance Use Topics  . Smoking status: Former Smoker    Packs/day: 0.20    Types: Cigarettes    Start date: 08/08/2015    Quit date: 09/2015  . Smokeless tobacco: Former Neurosurgeon    Quit date: 08/08/2015  . Alcohol use 0.0 oz/week     Comment: occ   OB History    No data available     Review of Systems  Constitutional: Negative.  Negative for fatigue and fever.  HENT: Negative.   Eyes: Negative.  Negative for visual disturbance.  Respiratory: Negative.  Negative for cough and shortness of breath.   Cardiovascular: Negative.  Negative for chest pain and leg swelling.  Gastrointestinal: Negative.   Endocrine: Negative.   Genitourinary: Positive for menstrual problem and vaginal discharge. Negative for dysuria, flank  pain, frequency, genital sores, hematuria, pelvic pain and urgency.       Reports vaginal itching and burning with urination as it flows over the labia. Not with initial onset of stream.  Musculoskeletal: Negative.  Negative for gait problem and neck stiffness.  Skin: Negative.  Negative for rash.  Allergic/Immunologic: Negative.   Neurological: Negative.  Negative for dizziness and headaches.  Hematological: Negative.   Psychiatric/Behavioral: Negative.     Allergies  Doxycycline  Home Medications   Prior to Admission medications   Medication Sig Start Date End Date Taking? Authorizing Provider  hydrochlorothiazide (HYDRODIURIL) 25 MG tablet Take 1 tablet (25 mg total) by mouth daily. Must have office visit for refills 01/03/16  Yes Ashly M Gottschalk, DO  metroNIDAZOLE (FLAGYL) 500 MG tablet Take 1 tablet (500 mg total) by mouth 2 (two) times daily. 08/04/16   Servando Salina, NP   Meds Ordered and Administered this Visit  Medications - No data to display  BP (!) 146/88 (BP Location: Right Arm)   Pulse 95   Temp 98.4 F (36.9 C) (Oral)   Resp 18   LMP 02/05/2016   SpO2 99%  No data found.   Physical Exam  Constitutional: She appears well-developed and well-nourished. No distress.  Eyes: Conjunctivae are normal. Pupils are equal, round, and reactive to light. Right eye exhibits no discharge. Left eye exhibits no discharge. No scleral icterus.  Neck: Normal range of motion. Neck supple. No thyromegaly present.  Cardiovascular: Normal  rate, regular rhythm, normal heart sounds and intact distal pulses.  Exam reveals no gallop and no friction rub.   No murmur heard. Pulmonary/Chest: Effort normal and breath sounds normal. No respiratory distress. She has no wheezes. She has no rales. She exhibits no tenderness.  Genitourinary: Uterus normal. Vaginal discharge found.  Genitourinary Comments: Greenish yellowish vaginal discharge noted from cervical os. Despite negative report of  STD from health department approximately 2 weeks ago test sent to confirm was not a false negative. Moderate amount of thin white discharge noted in vaginal vault consistent with bacterial vaginosis.  Skin: She is not diaphoretic.  Nursing note and vitals reviewed.   Urgent Care Course     Procedures (including critical care time)  Labs Review Labs Reviewed  POCT URINALYSIS DIP (DEVICE) - Abnormal; Notable for the following:       Result Value   Leukocytes, UA TRACE (*)    All other components within normal limits  POCT PREGNANCY, URINE  CYTOLOGY, (ORAL, ANAL, URETHRAL) ANCILLARY ONLY   Results for orders placed or performed during the hospital encounter of 08/04/16  POCT urinalysis dip (device)  Result Value Ref Range   Glucose, UA NEGATIVE NEGATIVE mg/dL   Bilirubin Urine NEGATIVE NEGATIVE   Ketones, ur NEGATIVE NEGATIVE mg/dL   Specific Gravity, Urine 1.020 1.005 - 1.030   Hgb urine dipstick NEGATIVE NEGATIVE   pH 6.5 5.0 - 8.0   Protein, ur NEGATIVE NEGATIVE mg/dL   Urobilinogen, UA 0.2 0.0 - 1.0 mg/dL   Nitrite NEGATIVE NEGATIVE   Leukocytes, UA TRACE (A) NEGATIVE  Pregnancy, urine POC  Result Value Ref Range   Preg Test, Ur NEGATIVE NEGATIVE    Imaging Review No results found.  Care was discussed with patient to include Flagyl for bacterial vaginosis. Patient is to obtain Monistat over-the-counter and use the cream externally on external labia as well as internally for comfort. Lastly the patient is to incorporate yogurt into her diet daily or a probiotic containing Lactobacillus.   MDM   1. Bacterial vaginosis    Meds ordered this encounter  Medications  . metroNIDAZOLE (FLAGYL) 500 MG tablet    Sig: Take 1 tablet (500 mg total) by mouth 2 (two) times daily.    Dispense:  14 tablet    Refill:  0   The usual and customary discharge instructions and warnings were given.  The patient verbalizes understanding and agrees to plan of care.   I provided d/c  instructions and paperwork. All questions were answered.     Servando Salinaatherine H Jenisse Vullo, NP 08/04/16 1757

## 2016-08-04 NOTE — ED Triage Notes (Signed)
[facility] a [facility] vaginal itching and "light spotting" that started last night.

## 2016-08-04 NOTE — Discharge Instructions (Signed)
Obtain an over the counter yeast treatment such as Monitstat.  Use cream over external labia for comfort.  We will call you if any of your labs test positive.

## 2016-08-05 LAB — CYTOLOGY, (ORAL, ANAL, URETHRAL) ANCILLARY ONLY
Chlamydia: NEGATIVE
NEISSERIA GONORRHEA: NEGATIVE

## 2016-08-05 LAB — CERVICOVAGINAL ANCILLARY ONLY: WET PREP (BD AFFIRM): NEGATIVE

## 2016-08-16 ENCOUNTER — Encounter (HOSPITAL_COMMUNITY): Payer: Self-pay | Admitting: Emergency Medicine

## 2016-08-16 ENCOUNTER — Emergency Department (HOSPITAL_COMMUNITY)
Admission: EM | Admit: 2016-08-16 | Discharge: 2016-08-16 | Disposition: A | Discharge status: Home / Self Care | Payer: Self-pay | Attending: Emergency Medicine | Admitting: Emergency Medicine

## 2016-08-16 DIAGNOSIS — B9689 Other specified bacterial agents as the cause of diseases classified elsewhere: Secondary | ICD-10-CM | POA: Insufficient documentation

## 2016-08-16 DIAGNOSIS — R1084 Generalized abdominal pain: Secondary | ICD-10-CM

## 2016-08-16 DIAGNOSIS — I10 Essential (primary) hypertension: Secondary | ICD-10-CM | POA: Insufficient documentation

## 2016-08-16 DIAGNOSIS — Z79899 Other long term (current) drug therapy: Secondary | ICD-10-CM | POA: Insufficient documentation

## 2016-08-16 DIAGNOSIS — N760 Acute vaginitis: Secondary | ICD-10-CM | POA: Insufficient documentation

## 2016-08-16 DIAGNOSIS — J45909 Unspecified asthma, uncomplicated: Secondary | ICD-10-CM | POA: Insufficient documentation

## 2016-08-16 DIAGNOSIS — Z87891 Personal history of nicotine dependence: Secondary | ICD-10-CM | POA: Insufficient documentation

## 2016-08-16 LAB — URINALYSIS, ROUTINE W REFLEX MICROSCOPIC
BACTERIA UA: NONE SEEN
Bilirubin Urine: NEGATIVE
Glucose, UA: NEGATIVE mg/dL
Ketones, ur: NEGATIVE mg/dL
Leukocytes, UA: NEGATIVE
Nitrite: NEGATIVE
PROTEIN: NEGATIVE mg/dL
Specific Gravity, Urine: 1.021 (ref 1.005–1.030)
pH: 6 (ref 5.0–8.0)

## 2016-08-16 LAB — CBC WITH DIFFERENTIAL/PLATELET
BASOS ABS: 0 10*3/uL (ref 0.0–0.1)
Basophils Relative: 0 %
Eosinophils Absolute: 0.1 10*3/uL (ref 0.0–0.7)
Eosinophils Relative: 1 %
HEMATOCRIT: 36.6 % (ref 36.0–46.0)
HEMOGLOBIN: 12 g/dL (ref 12.0–15.0)
LYMPHS PCT: 40 %
Lymphs Abs: 3.2 10*3/uL (ref 0.7–4.0)
MCH: 28.8 pg (ref 26.0–34.0)
MCHC: 32.8 g/dL (ref 30.0–36.0)
MCV: 88 fL (ref 78.0–100.0)
MONO ABS: 0.4 10*3/uL (ref 0.1–1.0)
MONOS PCT: 6 %
NEUTROS ABS: 4.3 10*3/uL (ref 1.7–7.7)
NEUTROS PCT: 53 %
Platelets: 240 10*3/uL (ref 150–400)
RBC: 4.16 MIL/uL (ref 3.87–5.11)
RDW: 13.9 % (ref 11.5–15.5)
WBC: 8.1 10*3/uL (ref 4.0–10.5)

## 2016-08-16 LAB — COMPREHENSIVE METABOLIC PANEL
ALK PHOS: 68 U/L (ref 38–126)
ALT: 23 U/L (ref 14–54)
AST: 25 U/L (ref 15–41)
Albumin: 3.7 g/dL (ref 3.5–5.0)
Anion gap: 11 (ref 5–15)
BILIRUBIN TOTAL: 0.5 mg/dL (ref 0.3–1.2)
BUN: 13 mg/dL (ref 6–20)
CALCIUM: 9.1 mg/dL (ref 8.9–10.3)
CO2: 23 mmol/L (ref 22–32)
CREATININE: 0.92 mg/dL (ref 0.44–1.00)
Chloride: 102 mmol/L (ref 101–111)
GFR calc Af Amer: >60 mL/min (ref >60)
GLUCOSE: 106 mg/dL — AB (ref 65–99)
POTASSIUM: 4.1 mmol/L (ref 3.5–5.1)
Sodium: 136 mmol/L (ref 135–145)
TOTAL PROTEIN: 7.5 g/dL (ref 6.5–8.1)

## 2016-08-16 LAB — WET PREP, GENITAL
SPERM: NONE SEEN
TRICH WET PREP: NONE SEEN
Yeast Wet Prep HPF POC: NONE SEEN

## 2016-08-16 LAB — I-STAT BETA HCG BLOOD, ED (MC, WL, AP ONLY): I-stat hCG, quantitative: <5 m[IU]/mL (ref <5)

## 2016-08-16 LAB — PREGNANCY, URINE: PREG TEST UR: NEGATIVE

## 2016-08-16 MED ORDER — ONDANSETRON HCL 4 MG PO TABS: 4.0000 mg | ORAL_TABLET | Freq: Four times a day (QID) | ORAL | 0 refills | Status: DC

## 2016-08-16 MED ORDER — METRONIDAZOLE 0.75 % VA GEL: 1.0000 | Freq: Two times a day (BID) | VAGINAL | 0 refills | Status: DC

## 2016-08-16 MED ORDER — DICYCLOMINE HCL 20 MG PO TABS: 20.0000 mg | ORAL_TABLET | Freq: Two times a day (BID) | ORAL | 0 refills | Status: DC

## 2016-08-16 MED ORDER — DICYCLOMINE HCL 10 MG PO CAPS
10.0000 mg | ORAL_CAPSULE | Freq: Once | ORAL | Status: AC
  Administered 2016-08-16: 10 mg via ORAL
  Filled 2016-08-16: qty 1

## 2016-08-16 NOTE — ED Triage Notes (Signed)
Pt to ED with c/o lower abd pain and vaginal discharge x's 2 days.  Pt denies nausea and vomiting

## 2016-08-16 NOTE — ED Provider Notes (Signed)
[facility]-EMERGENCY DEPT Provider Note   CSN: [id number] Arrival date & time: 08/16/16  0006     History   Chief Complaint Chief Complaint  Patient presents with  . Abdominal Pain    HPI Sara Taylor is a 26 y.o. female.  Patient presents with lower abdominal discomfort for the past 2 days. No fever, irregular vaginal bleeding, dysuria. She does c/o a vaginal discharge. No vaginal itching. She denies nausea, vomiting, change in bowel movements. She was seen and treated for vaginal vaginitis about 2 weeks ago and reports her symptoms are persistent despite taking PO Flagyl for the full course.     The history is provided by the patient. No language interpreter was used.  Abdominal Pain   Pertinent negatives include fever, nausea, vomiting, dysuria and myalgias.    Past Medical History:  Diagnosis Date  . Asthma   . Depression   . Hypertension   . Ovarian cyst     Patient Active Problem List   Diagnosis Date Noted  . Asthma 01/03/2016  . URI (upper respiratory infection) 11/17/2015  . Vaginal discharge 03/02/2014  . Preventative health care 06/02/2013  . Screening for STD (sexually transmitted disease) 06/02/2013  . Encounter for long-term (current) use of medications 05/26/2012  . History of chlamydia 05/26/2012  . TOBACCO USE 06/09/2008  . OBESITY, NOS 07/10/2006  . HYPERTENSION, BENIGN SYSTEMIC 07/10/2006  . RHINITIS, ALLERGIC 07/10/2006    History reviewed. No pertinent surgical history.  OB History    No data available       Home Medications    Prior to Admission medications   Medication Sig Start Date End Date Taking? Authorizing Provider  dicyclomine (BENTYL) 20 MG tablet Take 1 tablet (20 mg total) by mouth 2 (two) times daily. 08/16/16   Elpidio Anis, PA-C  hydrochlorothiazide (HYDRODIURIL) 25 MG tablet Take 1 tablet (25 mg total) by mouth daily. Must have office visit for refills 01/03/16   Raliegh Ip, DO  metroNIDAZOLE (FLAGYL) 500 MG  tablet Take 1 tablet (500 mg total) by mouth 2 (two) times daily. 08/04/16   Servando Salina, NP  metroNIDAZOLE (METROGEL VAGINAL) 0.75 % vaginal gel Place 1 Applicatorful vaginally 2 (two) times daily. 08/16/16   Elpidio Anis, PA-C  ondansetron (ZOFRAN) 4 MG tablet Take 1 tablet (4 mg total) by mouth every 6 (six) hours. 08/16/16   Elpidio Anis, PA-C    Family History Family History  Problem Relation Age of Onset  . Hypertension Maternal Grandmother   . Hypertension Maternal Grandfather   . Hypertension Paternal Grandmother   . Diabetes Paternal Grandmother   . Hypertension Mother   . Diabetes Mother   . Hypothyroidism Mother     Social History Social History  Substance Use Topics  . Smoking status: Former Smoker    Packs/day: 0.20    Types: Cigarettes    Start date: 08/08/2015    Quit date: 09/2015  . Smokeless tobacco: Former Neurosurgeon    Quit date: 08/08/2015  . Alcohol use 0.0 oz/week     Comment: occ     Allergies   Doxycycline   Review of Systems Review of Systems  Constitutional: Negative for chills and fever.  Respiratory: Negative.   Cardiovascular: Negative.   Gastrointestinal: Positive for abdominal pain. Negative for nausea and vomiting.  Genitourinary: Positive for vaginal discharge. Negative for dysuria and vaginal bleeding.  Musculoskeletal: Negative.  Negative for myalgias.  Neurological: Negative.  Negative for weakness and light-headedness.  Physical Exam Updated Vital Signs BP (!) 134/99   Pulse (!) 101   Temp 99.4 F (37.4 C) (Oral)   Resp 16   Ht  (1.651 m)   Wt (!) 149.7 kg   SpO2 97%   BMI 54.91 kg/m   Physical Exam  Constitutional: She is oriented to person, place, and time. She appears well-developed and well-nourished.  Neck: Normal range of motion.  Pulmonary/Chest: Effort normal.  Abdominal: Soft. There is no tenderness.  No reproducible lower abdominal tenderness.   Genitourinary:  Genitourinary Comments: White vaginal  discharge present. No CMT, adnexal tenderness or mass.   Neurological: She is alert and oriented to person, place, and time.  Skin: Skin is warm and dry.     ED Treatments / Results  Labs (all labs ordered are listed, but only abnormal results are displayed) Labs Reviewed  WET PREP, GENITAL - Abnormal; Notable for the following:       Result Value   Clue Cells Wet Prep HPF POC PRESENT (*)    WBC, Wet Prep HPF POC MANY (*)    All other components within normal limits  URINALYSIS, ROUTINE W REFLEX MICROSCOPIC - Abnormal; Notable for the following:    Hgb urine dipstick MODERATE (*)    Squamous Epithelial / LPF 0-5 (*)    All other components within normal limits  COMPREHENSIVE METABOLIC PANEL - Abnormal; Notable for the following:    Glucose, Bld 106 (*)    All other components within normal limits  PREGNANCY, URINE  CBC WITH DIFFERENTIAL/PLATELET  I-STAT BETA HCG BLOOD, ED ([facility], [facility], [facility] ONLY)    EKG  EKG Interpretation None       Radiology No results found.  Procedures Procedures (including critical care time)  Medications Ordered in ED Medications  dicyclomine (BENTYL) capsule 10 mg (10 mg Oral Given 08/16/16 0457)     Initial Impression / Assessment and Plan / ED Course  I have reviewed the triage vital signs and the nursing notes.  Pertinent labs & imaging results that were available during my care of the patient were reviewed by me and considered in my medical decision making (see chart for details).     Patient with persistent vaginal discharge for the past 2 weeks. She is here for evaluation of abdominal discomfort for the past 2 days. No fever, N, V.  She is found to have persistent BV infection. Without other tenderness, fever, and with normal labs, feel it is appropriate to treat BV and provide GYN referral.   Final Clinical Impressions(s) / ED Diagnoses   Final diagnoses:  BV (bacterial vaginosis)  Generalized abdominal pain    New  Prescriptions Discharge Medication List as of 08/16/2016  5:25 AM    START taking these medications   Details  dicyclomine (BENTYL) 20 MG tablet Take 1 tablet (20 mg total) by mouth 2 (two) times daily., Starting Fri 08/16/2016, Print    metroNIDAZOLE (METROGEL VAGINAL) 0.75 % vaginal gel Place 1 Applicatorful vaginally 2 (two) times daily., Starting Fri 08/16/2016, Print    ondansetron (ZOFRAN) 4 MG tablet Take 1 tablet (4 mg total) by mouth every 6 (six) hours., Starting Fri 08/16/2016, Print         Elpidio Anis, PA-C 08/19/16 2213    Gilda Crease, MD 08/29/16 [phone]

## 2016-08-16 NOTE — ED Notes (Signed)
Pt departed in NAD, refused use of wheelchair.  

## 2016-10-03 ENCOUNTER — Encounter (HOSPITAL_COMMUNITY): Payer: Self-pay

## 2016-10-03 ENCOUNTER — Inpatient Hospital Stay (HOSPITAL_COMMUNITY)
Admission: AD | Admit: 2016-10-03 | Discharge: 2016-10-03 | Disposition: A | Discharge status: Home / Self Care | Payer: Self-pay | Source: Ambulatory Visit | Attending: Obstetrics & Gynecology | Admitting: Obstetrics & Gynecology

## 2016-10-03 DIAGNOSIS — J45909 Unspecified asthma, uncomplicated: Secondary | ICD-10-CM | POA: Insufficient documentation

## 2016-10-03 DIAGNOSIS — N83209 Unspecified ovarian cyst, unspecified side: Secondary | ICD-10-CM | POA: Insufficient documentation

## 2016-10-03 DIAGNOSIS — N926 Irregular menstruation, unspecified: Secondary | ICD-10-CM

## 2016-10-03 DIAGNOSIS — F329 Major depressive disorder, single episode, unspecified: Secondary | ICD-10-CM | POA: Insufficient documentation

## 2016-10-03 DIAGNOSIS — Z3202 Encounter for pregnancy test, result negative: Secondary | ICD-10-CM | POA: Insufficient documentation

## 2016-10-03 DIAGNOSIS — Z87891 Personal history of nicotine dependence: Secondary | ICD-10-CM | POA: Insufficient documentation

## 2016-10-03 DIAGNOSIS — I10 Essential (primary) hypertension: Secondary | ICD-10-CM | POA: Insufficient documentation

## 2016-10-03 LAB — URINALYSIS, ROUTINE W REFLEX MICROSCOPIC
BILIRUBIN URINE: NEGATIVE
GLUCOSE, UA: NEGATIVE mg/dL
HGB URINE DIPSTICK: NEGATIVE
Ketones, ur: NEGATIVE mg/dL
Leukocytes, UA: NEGATIVE
Nitrite: NEGATIVE
PH: 5 (ref 5.0–8.0)
Protein, ur: NEGATIVE mg/dL
SPECIFIC GRAVITY, URINE: 1.025 (ref 1.005–1.030)

## 2016-10-03 LAB — WET PREP, GENITAL
Clue Cells Wet Prep HPF POC: NONE SEEN
SPERM: NONE SEEN
Trich, Wet Prep: NONE SEEN
YEAST WET PREP: NONE SEEN

## 2016-10-03 LAB — POCT PREGNANCY, URINE: PREG TEST UR: NEGATIVE

## 2016-10-03 NOTE — MAU Provider Note (Signed)
None     Chief Complaint:  ? Pregnancy and ? Vaginal infection  Sara Taylor is  26 y.o. who has irregular periods, wondering if she is pregnant.  Also has some vaginal irritation at times, no discharge.   Past Medical History:  Diagnosis Date  . Asthma   . Depression   . Hypertension   . Ovarian cyst     No past surgical history on file.  Family History  Problem Relation Age of Onset  . Hypertension Maternal Grandmother   . Hypertension Maternal Grandfather   . Hypertension Paternal Grandmother   . Diabetes Paternal Grandmother   . Hypertension Mother   . Diabetes Mother   . Hypothyroidism Mother     Social History  Substance Use Topics  . Smoking status: Former Smoker    Packs/day: 0.20    Types: Cigarettes    Start date: 08/08/2015    Quit date: 09/2015  . Smokeless tobacco: Former NeurosurgeonUser    Quit date: 08/08/2015  . Alcohol use 0.0 oz/week     Comment: occ    Allergies:  Allergies  Allergen Reactions  . Doxycycline Hives    Prescriptions Prior to Admission  Medication Sig Dispense Refill Last Dose  . dicyclomine (BENTYL) 20 MG tablet Take 1 tablet (20 mg total) by mouth 2 (two) times daily. 20 tablet 0   . hydrochlorothiazide (HYDRODIURIL) 25 MG tablet Take 1 tablet (25 mg total) by mouth daily. Must have office visit for refills 30 tablet 12 08/04/2016 at Unknown time  . metroNIDAZOLE (FLAGYL) 500 MG tablet Take 1 tablet (500 mg total) by mouth 2 (two) times daily. 14 tablet 0   . metroNIDAZOLE (METROGEL VAGINAL) 0.75 % vaginal gel Place 1 Applicatorful vaginally 2 (two) times daily. 70 g 0   . ondansetron (ZOFRAN) 4 MG tablet Take 1 tablet (4 mg total) by mouth every 6 (six) hours. 12 tablet 0      Review of Systems   Constitutional: Negative for fever and chills Eyes: Negative for visual disturbances Respiratory: Negative for shortness of breath, dyspnea Cardiovascular: Negative for chest pain or palpitations  Gastrointestinal: Negative for  vomiting, diarrhea and constipation Genitourinary: Negative for dysuria and urgency Musculoskeletal: Negative for back pain, joint pain, myalgias  Neurological: Negative for dizziness and headaches     Physical Exam   Blood pressure 120/78, pulse (!) 124, temperature 97.3 F (36.3 C), resp. rate 18, height 5\' 5"  (1.651 m), weight (!) 165.6 kg (365 lb 1.9 oz), last menstrual period 06/24/2016.  General: General appearance - alert, well appearing, and in no distress and oriented to person, place, and time Chest - normal respiratory effort Heart - normal rate and regular rhythm Extremities - no pedal edema noted   Labs: Results for orders placed or performed during the hospital encounter of 10/03/16 (from the past 24 hour(s))  Urinalysis, Routine w reflex microscopic   Collection Time: 10/03/16  2:50 PM  Result Value Ref Range   Color, Urine YELLOW YELLOW   APPearance HAZY (A) CLEAR   Specific Gravity, Urine 1.025 1.005 - 1.030   pH 5.0 5.0 - 8.0   Glucose, UA NEGATIVE NEGATIVE mg/dL   Hgb urine dipstick NEGATIVE NEGATIVE   Bilirubin Urine NEGATIVE NEGATIVE   Ketones, ur NEGATIVE NEGATIVE mg/dL   Protein, ur NEGATIVE NEGATIVE mg/dL   Nitrite NEGATIVE NEGATIVE   Leukocytes, UA NEGATIVE NEGATIVE  Wet prep, genital   Collection Time: 10/03/16  5:48 PM  Result Value Ref Range  Yeast Wet Prep HPF POC NONE SEEN NONE SEEN   Trich, Wet Prep NONE SEEN NONE SEEN   Clue Cells Wet Prep HPF POC NONE SEEN NONE SEEN   WBC, Wet Prep HPF POC FEW (A) NONE SEEN   Sperm NONE SEEN   Pregnancy, urine POC   Collection Time: 10/03/16  7:57 PM  Result Value Ref Range   Preg Test, Ur NEGATIVE NEGATIVE   Imaging Studies:  No results found.   Assessment: UPT neg and no signs of vaginal infection  Plan: Take home UPT if still hasn't started period in a week or so.   CRESENZO-DISHMAN,Vandella Ord

## 2016-10-03 NOTE — MAU Note (Signed)
Reports that yesterday she has been throwing up, having a headache, and vaginal irritation since yesterday. States her vagina is itchy. No changes in discharge or bleeding.

## 2016-10-03 NOTE — MAU Note (Signed)
NOT IN LOBBY 

## 2016-10-07 ENCOUNTER — Emergency Department (HOSPITAL_COMMUNITY)
Admission: EM | Admit: 2016-10-07 | Discharge: 2016-10-07 | Disposition: A | Discharge status: Home / Self Care | Payer: Self-pay | Attending: Emergency Medicine | Admitting: Emergency Medicine

## 2016-10-07 ENCOUNTER — Encounter (HOSPITAL_COMMUNITY): Payer: Self-pay | Admitting: Nurse Practitioner

## 2016-10-07 DIAGNOSIS — J45909 Unspecified asthma, uncomplicated: Secondary | ICD-10-CM | POA: Insufficient documentation

## 2016-10-07 DIAGNOSIS — N898 Other specified noninflammatory disorders of vagina: Secondary | ICD-10-CM

## 2016-10-07 DIAGNOSIS — Z87891 Personal history of nicotine dependence: Secondary | ICD-10-CM | POA: Insufficient documentation

## 2016-10-07 DIAGNOSIS — Z79899 Other long term (current) drug therapy: Secondary | ICD-10-CM | POA: Insufficient documentation

## 2016-10-07 DIAGNOSIS — I10 Essential (primary) hypertension: Secondary | ICD-10-CM | POA: Insufficient documentation

## 2016-10-07 DIAGNOSIS — N760 Acute vaginitis: Secondary | ICD-10-CM | POA: Insufficient documentation

## 2016-10-07 DIAGNOSIS — B9689 Other specified bacterial agents as the cause of diseases classified elsewhere: Secondary | ICD-10-CM | POA: Insufficient documentation

## 2016-10-07 LAB — URINALYSIS, ROUTINE W REFLEX MICROSCOPIC
Bacteria, UA: NONE SEEN
Bilirubin Urine: NEGATIVE
GLUCOSE, UA: NEGATIVE mg/dL
Hgb urine dipstick: NEGATIVE
Ketones, ur: NEGATIVE mg/dL
Nitrite: NEGATIVE
PH: 5 (ref 5.0–8.0)
Protein, ur: NEGATIVE mg/dL
SPECIFIC GRAVITY, URINE: 1.019 (ref 1.005–1.030)

## 2016-10-07 LAB — WET PREP, GENITAL
SPERM: NONE SEEN
TRICH WET PREP: NONE SEEN
Yeast Wet Prep HPF POC: NONE SEEN

## 2016-10-07 LAB — PREGNANCY, URINE: Preg Test, Ur: NEGATIVE

## 2016-10-07 MED ORDER — METRONIDAZOLE 0.75 % VA GEL: 1.0000 | Freq: Two times a day (BID) | VAGINAL | 0 refills | Status: DC

## 2016-10-07 MED ORDER — FLUCONAZOLE 150 MG PO TABS
150.0000 mg | ORAL_TABLET | Freq: Once | ORAL | Status: AC
  Administered 2016-10-07: 150 mg via ORAL
  Filled 2016-10-07: qty 1

## 2016-10-07 NOTE — ED Triage Notes (Signed)
Pt states she noticed white vaginal discharge yesterday and has hx of yeast infections.

## 2016-10-07 NOTE — ED Provider Notes (Signed)
[facility]-EMERGENCY DEPT Provider Note   CSN: [id number] Arrival date & time: 10/07/16  0219     History   Chief Complaint Chief Complaint  Patient presents with  . Vaginal Discharge    HPI Sara Taylor is a 26 y.o. female.  26 year old female with a history of hypertension and asthma presents to the emergency department for vaginal discharge. She reports a white, thick discharge which has been present since yesterday. Symptoms associated with some discomfort when voiding. Patient describes this discomfort as burning in nature. She has also had some increased itching in her genital region. She states that she is prone to yeast infections frequently. She has had no associated vaginal bleeding. No fevers, abdominal pain, vomiting. Patient states that she is sexually active with one partner in the last 6 months. She denies concern for STDs.      Past Medical History:  Diagnosis Date  . Asthma   . Depression   . Hypertension   . Ovarian cyst     Patient Active Problem List   Diagnosis Date Noted  . Asthma 01/03/2016  . URI (upper respiratory infection) 11/17/2015  . Vaginal discharge 03/02/2014  . Preventative health care 06/02/2013  . Screening for STD (sexually transmitted disease) 06/02/2013  . Encounter for long-term (current) use of medications 05/26/2012  . History of chlamydia 05/26/2012  . TOBACCO USE 06/09/2008  . OBESITY, NOS 07/10/2006  . HYPERTENSION, BENIGN SYSTEMIC 07/10/2006  . RHINITIS, ALLERGIC 07/10/2006    History reviewed. No pertinent surgical history.  OB History    No data available       Home Medications    Prior to Admission medications   Medication Sig Start Date End Date Taking? Authorizing Provider  hydrochlorothiazide (HYDRODIURIL) 25 MG tablet Take 1 tablet (25 mg total) by mouth daily. Must have office visit for refills Patient taking differently: Take 25 mg by mouth daily at 12 noon. Must have office visit for refills 01/03/16   Yes Delynn Flavin M, DO  metroNIDAZOLE (METROGEL VAGINAL) 0.75 % vaginal gel Place 1 Applicatorful vaginally 2 (two) times daily. Use for 7 days 10/07/16   Antony Madura, PA-C    Family History Family History  Problem Relation Age of Onset  . Hypertension Maternal Grandmother   . Hypertension Maternal Grandfather   . Hypertension Paternal Grandmother   . Diabetes Paternal Grandmother   . Hypertension Mother   . Diabetes Mother   . Hypothyroidism Mother     Social History Social History  Substance Use Topics  . Smoking status: Former Smoker    Packs/day: 0.20    Types: Cigarettes    Start date: 08/08/2015    Quit date: 09/2015  . Smokeless tobacco: Former Neurosurgeon    Quit date: 08/08/2015  . Alcohol use 0.0 oz/week     Comment: occ     Allergies   Doxycycline   Review of Systems Review of Systems Ten systems reviewed and are negative for acute change, except as noted in the HPI.    Physical Exam Updated Vital Signs BP (!) 144/87 (BP Location: Right Arm)   Pulse (!) 104   Temp 98.2 F (36.8 C) (Oral)   Resp 16   SpO2 94%   Physical Exam  Constitutional: She is oriented to person, place, and time. She appears well-developed and well-nourished. No distress.  Nontoxic appearing and in NAD  HENT:  Head: Normocephalic and atraumatic.  Eyes: Conjunctivae and EOM are normal. No scleral icterus.  Neck: Normal range of  motion.  Pulmonary/Chest: Effort normal. No respiratory distress. She has no wheezes.  Respirations even and unlabored  Genitourinary:  Genitourinary Comments: No cervical friability. Clumped and thin white discharge. No bleeding. External genitalia normal.  Musculoskeletal: Normal range of motion.  Neurological: She is alert and oriented to person, place, and time. She exhibits normal muscle tone. Coordination normal.  Skin: Skin is warm and dry. No rash noted. She is not diaphoretic. No erythema. No pallor.  Psychiatric: She has a normal mood and  affect. Her behavior is normal.  Nursing note and vitals reviewed.    ED Treatments / Results  Labs (all labs ordered are listed, but only abnormal results are displayed) Labs Reviewed  WET PREP, GENITAL - Abnormal; Notable for the following:       Result Value   Clue Cells Wet Prep HPF POC PRESENT (*)    WBC, Wet Prep HPF POC MANY (*)    All other components within normal limits  URINALYSIS, ROUTINE W REFLEX MICROSCOPIC - Abnormal; Notable for the following:    Leukocytes, UA SMALL (*)    Squamous Epithelial / LPF 0-5 (*)    All other components within normal limits  PREGNANCY, URINE  GC/CHLAMYDIA PROBE AMP ([place]) NOT [facility]    EKG  EKG Interpretation None       Radiology No results found.  Procedures Procedures (including critical care time)  Medications Ordered in ED Medications  fluconazole (DIFLUCAN) tablet 150 mg (not administered)    4:54 AM HR reviewed. Tachycardia appears at baseline compared to prior encounters; improved compared to 10/03/16.   Initial Impression / Assessment and Plan / ED Course  I have reviewed the triage vital signs and the nursing notes.  Pertinent labs & imaging results that were available during my care of the patient were reviewed by me and considered in my medical decision making (see chart for details).     26 year old female presents to the emergency department for vaginal discharge which began yesterday. She reports burning with urination and vaginal itching. Clinically, this is consistent with a yeast infection. Clumped discharge noted on pelvic exam. Patient also noted to be positive for bacterial vaginosis.  Patient given a tablet of Diflucan in the emergency department. She will be discharged with MetroGel. The patient is to follow-up with her primary care doctor regarding her visit today. Return precautions discussed and provided. Patient discharged in stable condition with no unaddressed concerns.   Final  Clinical Impressions(s) / ED Diagnoses   Final diagnoses:  Vaginal itching  BV (bacterial vaginosis)    New Prescriptions Current Discharge Medication List       Antony MaduraHumes, Sidra Oldfield, Cordelia Poche-C 10/07/16 [id number]    Palumbo, April, MD 10/07/16 [phone]

## 2016-10-08 LAB — GC/CHLAMYDIA PROBE AMP (~~LOC~~) NOT AT ARMC
Chlamydia: NEGATIVE
Chlamydia: NEGATIVE
Neisseria Gonorrhea: NEGATIVE
Neisseria Gonorrhea: NEGATIVE

## 2016-10-13 ENCOUNTER — Encounter (HOSPITAL_COMMUNITY): Payer: Self-pay | Admitting: Emergency Medicine

## 2016-10-13 ENCOUNTER — Ambulatory Visit (HOSPITAL_COMMUNITY)
Admission: EM | Admit: 2016-10-13 | Discharge: 2016-10-13 | Disposition: A | Discharge status: Home / Self Care | Payer: Self-pay | Attending: Internal Medicine | Admitting: Internal Medicine

## 2016-10-13 DIAGNOSIS — R300 Dysuria: Secondary | ICD-10-CM

## 2016-10-13 DIAGNOSIS — N760 Acute vaginitis: Secondary | ICD-10-CM

## 2016-10-13 DIAGNOSIS — B9689 Other specified bacterial agents as the cause of diseases classified elsewhere: Secondary | ICD-10-CM

## 2016-10-13 LAB — POCT URINALYSIS DIP (DEVICE)
BILIRUBIN URINE: NEGATIVE
Glucose, UA: NEGATIVE mg/dL
KETONES UR: NEGATIVE mg/dL
NITRITE: NEGATIVE
PROTEIN: NEGATIVE mg/dL
Specific Gravity, Urine: 1.02 (ref 1.005–1.030)
Urobilinogen, UA: 0.2 mg/dL (ref 0.0–1.0)
pH: 5.5 (ref 5.0–8.0)

## 2016-10-13 MED ORDER — METRONIDAZOLE 500 MG PO TABS: 500.0000 mg | ORAL_TABLET | Freq: Two times a day (BID) | ORAL | 0 refills | Status: DC

## 2016-10-13 NOTE — ED Provider Notes (Signed)
[facility]    CSN: [id number] Arrival date & time: 10/13/16  1918     History   Chief Complaint Chief Complaint  Patient presents with  . Vaginal Discharge    HPI Sara Taylor is a 26 y.o. female. She presents today with vaginal irritation and discharge, dysuria. She was seen at [facility] ED on May 28, and diagnosed with bacterial vaginosis. She was given a prescription for MetroGel, which was too expensive. She's had some difficulty getting this prescription changed to an oral formulation. No abdominal/pelvic pain. No fever. No malaise.    HPI  Past Medical History:  Diagnosis Date  . Asthma   . Depression   . Hypertension   . Ovarian cyst     Patient Active Problem List   Diagnosis Date Noted  . Asthma 01/03/2016  . URI (upper respiratory infection) 11/17/2015  . Vaginal discharge 03/02/2014  . Preventative health care 06/02/2013  . Screening for STD (sexually transmitted disease) 06/02/2013  . Encounter for long-term (current) use of medications 05/26/2012  . History of chlamydia 05/26/2012  . TOBACCO USE 06/09/2008  . OBESITY, NOS 07/10/2006  . HYPERTENSION, BENIGN SYSTEMIC 07/10/2006  . RHINITIS, ALLERGIC 07/10/2006    History reviewed. No pertinent surgical history.   Home Medications    Prior to Admission medications   Medication Sig Start Date End Date Taking? Authorizing Provider  hydrochlorothiazide (HYDRODIURIL) 25 MG tablet Take 1 tablet (25 mg total) by mouth daily. Must have office visit for refills Patient taking differently: Take 25 mg by mouth daily at 12 noon. Must have office visit for refills 01/03/16  Yes Delynn FlavinGottschalk, Ashly M, DO  metroNIDAZOLE (FLAGYL) 500 MG tablet Take 1 tablet (500 mg total) by mouth 2 (two) times daily. 10/13/16   Eustace MooreMurray, Quayshaun Hubbert W, MD    Family History Family History  Problem Relation Age of Onset  . Hypertension Maternal Grandmother   . Hypertension Maternal Grandfather   . Hypertension Paternal  Grandmother   . Diabetes Paternal Grandmother   . Hypertension Mother   . Diabetes Mother   . Hypothyroidism Mother     Social History Social History  Substance Use Topics  . Smoking status: Former Smoker    Packs/day: 0.20    Types: Cigarettes    Start date: 08/08/2015    Quit date: 09/2015  . Smokeless tobacco: Former NeurosurgeonUser    Quit date: 08/08/2015  . Alcohol use 0.0 oz/week     Comment: occ     Allergies   Doxycycline   Review of Systems Review of Systems  All other systems reviewed and are negative.    Physical Exam Triage Vital Signs ED Triage Vitals  Enc Vitals Group     BP 10/13/16 1959 (!) 141/91     Pulse Rate 10/13/16 1959 (!) 101     Resp 10/13/16 1959 20     Temp 10/13/16 1959 98.4 F (36.9 C)     Temp Source 10/13/16 1959 Oral     SpO2 10/13/16 1959 99 %     Weight --      Height --      Pain Score 10/13/16 1958 0     Pain Loc --    Updated Vital Signs BP (!) 141/91 (BP Location: Right Arm)   Pulse (!) 101   Temp 98.4 F (36.9 C) (Oral)   Resp 20   SpO2 99%   Physical Exam  Constitutional: She is oriented to person, place, and time. No  distress.  HENT:  Head: Atraumatic.  Eyes:  Conjugate gaze observed, no eye redness/discharge  Neck: Neck supple.  Cardiovascular: Normal rate.   Pulmonary/Chest: No respiratory distress.  Abdominal: She exhibits no distension.  Musculoskeletal: Normal range of motion.  Neurological: She is alert and oriented to person, place, and time.  Skin: Skin is warm and dry.  Nursing note and vitals reviewed.    UC Treatments / Results  Labs (all labs ordered are listed, but only abnormal results are displayed) Labs Reviewed  POCT URINALYSIS DIP (DEVICE) - Abnormal; Notable for the following:       Result Value   Hgb urine dipstick TRACE (*)    Leukocytes, UA TRACE (*)    All other components within normal limits   Procedures Procedures (including critical care time) None today  Final Clinical  Impressions(s) / UC Diagnoses   Final diagnoses:  BV (bacterial vaginosis)   Prescription for metronidazole tablets was sent to the [facility] on [address].  Recheck or followup with primary care provider for further evaluation if symptoms are not improving.     New Prescriptions New Prescriptions   METRONIDAZOLE (FLAGYL) 500 MG TABLET    Take 1 tablet (500 mg total) by mouth 2 (two) times daily.     Eustace Moore, MD 10/14/16 1044

## 2016-10-13 NOTE — ED Triage Notes (Signed)
[facility] a [facility] of a vaginal discharge and dysuria x 3 days.

## 2016-10-13 NOTE — Discharge Instructions (Addendum)
Pres[facility] for [facility]idazole tablets [facility] on [address].  Recheck or followup with primary care provider for further evaluation if symptoms are not improving.

## 2016-12-23 ENCOUNTER — Emergency Department (HOSPITAL_COMMUNITY)
Admission: EM | Admit: 2016-12-23 | Discharge: 2016-12-24 | Disposition: A | Discharge status: Home / Self Care | Payer: Self-pay | Attending: Emergency Medicine | Admitting: Emergency Medicine

## 2016-12-23 ENCOUNTER — Encounter (HOSPITAL_COMMUNITY): Payer: Self-pay | Admitting: Emergency Medicine

## 2016-12-23 DIAGNOSIS — I10 Essential (primary) hypertension: Secondary | ICD-10-CM | POA: Insufficient documentation

## 2016-12-23 DIAGNOSIS — Z87891 Personal history of nicotine dependence: Secondary | ICD-10-CM | POA: Insufficient documentation

## 2016-12-23 DIAGNOSIS — J45909 Unspecified asthma, uncomplicated: Secondary | ICD-10-CM | POA: Insufficient documentation

## 2016-12-23 DIAGNOSIS — N760 Acute vaginitis: Secondary | ICD-10-CM | POA: Insufficient documentation

## 2016-12-23 DIAGNOSIS — B9689 Other specified bacterial agents as the cause of diseases classified elsewhere: Secondary | ICD-10-CM | POA: Insufficient documentation

## 2016-12-23 DIAGNOSIS — Z79899 Other long term (current) drug therapy: Secondary | ICD-10-CM | POA: Insufficient documentation

## 2016-12-23 DIAGNOSIS — N939 Abnormal uterine and vaginal bleeding, unspecified: Secondary | ICD-10-CM

## 2016-12-23 LAB — BASIC METABOLIC PANEL
ANION GAP: 5 (ref 5–15)
BUN: 6 mg/dL (ref 6–20)
CALCIUM: 8.9 mg/dL (ref 8.9–10.3)
CO2: 28 mmol/L (ref 22–32)
Chloride: 106 mmol/L (ref 101–111)
Creatinine, Ser: 0.81 mg/dL (ref 0.44–1.00)
GFR calc Af Amer: >60 mL/min (ref >60)
GFR calc non Af Amer: >60 mL/min (ref >60)
GLUCOSE: 92 mg/dL (ref 65–99)
Potassium: 3.4 mmol/L — ABNORMAL LOW (ref 3.5–5.1)
Sodium: 139 mmol/L (ref 135–145)

## 2016-12-23 LAB — I-STAT BETA HCG BLOOD, ED (MC, WL, AP ONLY): I-stat hCG, quantitative: <5 m[IU]/mL (ref <5)

## 2016-12-23 LAB — CBC
HEMATOCRIT: 35.8 % — AB (ref 36.0–46.0)
Hemoglobin: 11.8 g/dL — ABNORMAL LOW (ref 12.0–15.0)
MCH: 28.6 pg (ref 26.0–34.0)
MCHC: 33 g/dL (ref 30.0–36.0)
MCV: 86.7 fL (ref 78.0–100.0)
PLATELETS: 207 10*3/uL (ref 150–400)
RBC: 4.13 MIL/uL (ref 3.87–5.11)
RDW: 13.3 % (ref 11.5–15.5)
WBC: 6.1 10*3/uL (ref 4.0–10.5)

## 2016-12-23 NOTE — ED Triage Notes (Signed)
Pt presents to ED for 1 month of intermittent vaginal bleeding.  Pt notes intermittent pain to the right and left groin, intermittent lower back pain, and intermittent heavy bleeding.  C/o some small clots as well.  Currently not taking any birth control.

## 2016-12-24 LAB — URINALYSIS, ROUTINE W REFLEX MICROSCOPIC
Bilirubin Urine: NEGATIVE
Glucose, UA: NEGATIVE mg/dL
KETONES UR: NEGATIVE mg/dL
Leukocytes, UA: NEGATIVE
Nitrite: NEGATIVE
PROTEIN: 100 mg/dL — AB
Specific Gravity, Urine: 1.03 (ref 1.005–1.030)
pH: 6 (ref 5.0–8.0)

## 2016-12-24 LAB — WET PREP, GENITAL
Sperm: NONE SEEN
Trich, Wet Prep: NONE SEEN
YEAST WET PREP: NONE SEEN

## 2016-12-24 MED ORDER — METRONIDAZOLE 500 MG PO TABS: 500.0000 mg | ORAL_TABLET | Freq: Two times a day (BID) | ORAL | 0 refills | Status: DC

## 2016-12-24 NOTE — ED Provider Notes (Signed)
[facility]   CSN: [id number] Arrival date & time: 12/23/16  1939     History   Chief Complaint Chief Complaint  Patient presents with  . Vaginal Bleeding    HPI Sara Taylor is a 26 y.o. female.  Patient presents to the emergency department with chief complaint of vaginal bleeding. She states that she has had vaginal bleeding for about one month. She reports using 2-3 pads or tampons per day. She denies any chest pain or shortness breath. Denies any lightheadedness or dizziness. She states that she wanted to be evaluated as to the source for bleeding. She has not taken anything for symptoms. She does not use birth control. She denies any dysuria or urinary frequency. There are no other associated symptoms or modifying factors.   The history is provided by the patient. No language interpreter was used.    Past Medical History:  Diagnosis Date  . Asthma   . Depression   . Hypertension   . Ovarian cyst     Patient Active Problem List   Diagnosis Date Noted  . Asthma 01/03/2016  . URI (upper respiratory infection) 11/17/2015  . Vaginal discharge 03/02/2014  . Preventative health care 06/02/2013  . Screening for STD (sexually transmitted disease) 06/02/2013  . Encounter for long-term (current) use of medications 05/26/2012  . History of chlamydia 05/26/2012  . TOBACCO USE 06/09/2008  . OBESITY, NOS 07/10/2006  . HYPERTENSION, BENIGN SYSTEMIC 07/10/2006  . RHINITIS, ALLERGIC 07/10/2006    History reviewed. No pertinent surgical history.  OB History    No data available       Home Medications    Prior to Admission medications   Medication Sig Start Date End Date Taking? Authorizing Provider  hydrochlorothiazide (HYDRODIURIL) 25 MG tablet Take 1 tablet (25 mg total) by mouth daily. Must have office visit for refills Patient taking differently: Take 25 mg by mouth daily at 12 noon. Must have office visit for refills 01/03/16  Yes  Delynn FlavinGottschalk, Ashly M, DO  metroNIDAZOLE (FLAGYL) 500 MG tablet Take 1 tablet (500 mg total) by mouth 2 (two) times daily. 12/24/16   Roxy HorsemanBrowning, Tresean Mattix, PA-C    Family History Family History  Problem Relation Age of Onset  . Hypertension Maternal Grandmother   . Hypertension Maternal Grandfather   . Hypertension Paternal Grandmother   . Diabetes Paternal Grandmother   . Hypertension Mother   . Diabetes Mother   . Hypothyroidism Mother     Social History Social History  Substance Use Topics  . Smoking status: Former Smoker    Packs/day: 0.20    Types: Cigarettes    Start date: 08/08/2015    Quit date: 09/2015  . Smokeless tobacco: Former NeurosurgeonUser    Quit date: 08/08/2015  . Alcohol use 0.0 oz/week     Comment: occ     Allergies   Doxycycline   Review of Systems Review of Systems  All other systems reviewed and are negative.    Physical Exam Updated Vital Signs BP (!) 148/91 (BP Location: Left Wrist)   Pulse 96   Temp 98.5 F (36.9 C) (Oral)   Resp 20   LMP 12/23/2016   SpO2 98%   Physical Exam  Constitutional: She is oriented to person, place, and time. She appears well-developed and well-nourished.  HENT:  Head: Normocephalic and atraumatic.  Eyes: Pupils are equal, round, and reactive to light. Conjunctivae and EOM are normal.  Neck: Normal range of motion. Neck supple.  Cardiovascular:  Normal rate and regular rhythm.  Exam reveals no gallop and no friction rub.   No murmur heard. Pulmonary/Chest: Effort normal and breath sounds normal. No respiratory distress. She has no wheezes. She has no rales. She exhibits no tenderness.  Abdominal: Soft. Bowel sounds are normal. She exhibits no distension and no mass. There is no tenderness. There is no rebound and no guarding.  Genitourinary:  Genitourinary Comments: Pelvic exam chaperoned by female ER tech, no right or left adnexal tenderness, no uterine tenderness, no vaginal discharge, no bleeding, no CMT or friability,  no foreign body, no injury to the external genitalia, no other significant findings   Musculoskeletal: Normal range of motion. She exhibits no edema or tenderness.  Neurological: She is alert and oriented to person, place, and time.  Skin: Skin is warm and dry.  Psychiatric: She has a normal mood and affect. Her behavior is normal. Judgment and thought content normal.  Nursing note and vitals reviewed.    ED Treatments / Results  Labs (all labs ordered are listed, but only abnormal results are displayed) Labs Reviewed  WET PREP, GENITAL - Abnormal; Notable for the following:       Result Value   Clue Cells Wet Prep HPF POC PRESENT (*)    WBC, Wet Prep HPF POC MANY (*)    All other components within normal limits  CBC - Abnormal; Notable for the following:    Hemoglobin 11.8 (*)    HCT 35.8 (*)    All other components within normal limits  BASIC METABOLIC PANEL - Abnormal; Notable for the following:    Potassium 3.4 (*)    All other components within normal limits  URINALYSIS, ROUTINE W REFLEX MICROSCOPIC - Abnormal; Notable for the following:    APPearance HAZY (*)    Hgb urine dipstick LARGE (*)    Protein, ur 100 (*)    Bacteria, UA RARE (*)    Squamous Epithelial / LPF 6-30 (*)    All other components within normal limits  I-STAT BETA HCG BLOOD, ED ([facility], [facility], [facility] ONLY)  GC/CHLAMYDIA PROBE AMP ([place]) NOT AT [facility]    EKG  EKG Interpretation None       Radiology No results found.  Procedures Procedures (including critical care time)  Medications Ordered in ED Medications - No data to display   Initial Impression / Assessment and Plan / ED Course  I have reviewed the triage vital signs and the nursing notes.  Pertinent labs & imaging results that were available during my care of the patient were reviewed by me and considered in my medical decision making (see chart for details).     Patient with vaginal bleeding. H&H are stable. Vital signs are stable.No  evidence of hemorrhage on pelvic exam. Wet prep shows clue cells, will treat for BV. Recommend close follow-up with [facility] or OB/GYN.  Final Clinical Impressions(s) / ED Diagnoses   Final diagnoses:  Vaginal bleeding  BV (bacterial vaginosis)    New Prescriptions New Prescriptions   METRONIDAZOLE (FLAGYL) 500 MG TABLET    Take 1 tablet (500 mg total) by mouth 2 (two) times daily.     Roxy Horseman, PA-C 12/24/16 4098    Derwood Kaplan, MD 12/24/16 1000

## 2016-12-25 LAB — GC/CHLAMYDIA PROBE AMP (~~LOC~~) NOT AT ARMC
CHLAMYDIA, DNA PROBE: NEGATIVE
NEISSERIA GONORRHEA: NEGATIVE

## 2017-02-17 ENCOUNTER — Emergency Department (HOSPITAL_COMMUNITY): Payer: Self-pay

## 2017-02-17 ENCOUNTER — Emergency Department (HOSPITAL_COMMUNITY)
Admission: EM | Admit: 2017-02-17 | Discharge: 2017-02-17 | Disposition: A | Discharge status: Home / Self Care | Payer: Self-pay | Attending: Emergency Medicine | Admitting: Emergency Medicine

## 2017-02-17 ENCOUNTER — Encounter (HOSPITAL_COMMUNITY): Payer: Self-pay | Admitting: *Deleted

## 2017-02-17 DIAGNOSIS — R1030 Lower abdominal pain, unspecified: Secondary | ICD-10-CM

## 2017-02-17 DIAGNOSIS — Z87891 Personal history of nicotine dependence: Secondary | ICD-10-CM | POA: Insufficient documentation

## 2017-02-17 DIAGNOSIS — N946 Dysmenorrhea, unspecified: Secondary | ICD-10-CM

## 2017-02-17 DIAGNOSIS — R110 Nausea: Secondary | ICD-10-CM

## 2017-02-17 DIAGNOSIS — N390 Urinary tract infection, site not specified: Secondary | ICD-10-CM | POA: Insufficient documentation

## 2017-02-17 DIAGNOSIS — I10 Essential (primary) hypertension: Secondary | ICD-10-CM | POA: Insufficient documentation

## 2017-02-17 DIAGNOSIS — R102 Pelvic and perineal pain: Secondary | ICD-10-CM

## 2017-02-17 DIAGNOSIS — N938 Other specified abnormal uterine and vaginal bleeding: Secondary | ICD-10-CM | POA: Insufficient documentation

## 2017-02-17 LAB — URINALYSIS, ROUTINE W REFLEX MICROSCOPIC
BILIRUBIN URINE: NEGATIVE
GLUCOSE, UA: NEGATIVE mg/dL
KETONES UR: NEGATIVE mg/dL
Nitrite: NEGATIVE
PH: 6 (ref 5.0–8.0)
PROTEIN: NEGATIVE mg/dL
Specific Gravity, Urine: 1.018 (ref 1.005–1.030)

## 2017-02-17 LAB — CBC WITH DIFFERENTIAL/PLATELET
Basophils Absolute: 0 10*3/uL (ref 0.0–0.1)
Basophils Relative: 0 %
Eosinophils Absolute: 0.1 10*3/uL (ref 0.0–0.7)
Eosinophils Relative: 1 %
HEMATOCRIT: 36.7 % (ref 36.0–46.0)
HEMOGLOBIN: 12.3 g/dL (ref 12.0–15.0)
LYMPHS ABS: 2.8 10*3/uL (ref 0.7–4.0)
Lymphocytes Relative: 47 %
MCH: 29.3 pg (ref 26.0–34.0)
MCHC: 33.5 g/dL (ref 30.0–36.0)
MCV: 87.4 fL (ref 78.0–100.0)
MONOS PCT: 5 %
Monocytes Absolute: 0.3 10*3/uL (ref 0.1–1.0)
NEUTROS ABS: 2.8 10*3/uL (ref 1.7–7.7)
NEUTROS PCT: 47 %
Platelets: 221 10*3/uL (ref 150–400)
RBC: 4.2 MIL/uL (ref 3.87–5.11)
RDW: 13.5 % (ref 11.5–15.5)
WBC: 6.1 10*3/uL (ref 4.0–10.5)

## 2017-02-17 LAB — POC URINE PREG, ED: PREG TEST UR: NEGATIVE

## 2017-02-17 LAB — COMPREHENSIVE METABOLIC PANEL
ALBUMIN: 3.9 g/dL (ref 3.5–5.0)
ALK PHOS: 69 U/L (ref 38–126)
ALT: 19 U/L (ref 14–54)
ANION GAP: 8 (ref 5–15)
AST: 35 U/L (ref 15–41)
BILIRUBIN TOTAL: 1.6 mg/dL — AB (ref 0.3–1.2)
BUN: 5 mg/dL — ABNORMAL LOW (ref 6–20)
CALCIUM: 8.9 mg/dL (ref 8.9–10.3)
CO2: 26 mmol/L (ref 22–32)
CREATININE: 0.79 mg/dL (ref 0.44–1.00)
Chloride: 102 mmol/L (ref 101–111)
GFR calc non Af Amer: >60 mL/min (ref >60)
GLUCOSE: 89 mg/dL (ref 65–99)
Potassium: 4.3 mmol/L (ref 3.5–5.1)
Sodium: 136 mmol/L (ref 135–145)
TOTAL PROTEIN: 7.8 g/dL (ref 6.5–8.1)

## 2017-02-17 LAB — WET PREP, GENITAL
Clue Cells Wet Prep HPF POC: NONE SEEN
Sperm: NONE SEEN
Trich, Wet Prep: NONE SEEN
Yeast Wet Prep HPF POC: NONE SEEN

## 2017-02-17 MED ORDER — ONDANSETRON 4 MG PO TBDP: 4.0000 mg | ORAL_TABLET | Freq: Three times a day (TID) | ORAL | 0 refills | Status: DC | PRN

## 2017-02-17 MED ORDER — CEPHALEXIN 500 MG PO CAPS
ORAL_CAPSULE | ORAL | 0 refills | Status: DC
Start: 2017-02-17 — End: 2018-02-19

## 2017-02-17 NOTE — ED Provider Notes (Signed)
[facility]-EMERGENCY DEPT Provider Note   CSN: [id number] Arrival date & time: 02/17/17  1315     History   Chief Complaint Chief Complaint  Patient presents with  . Pelvic Pain  . Vaginal Discharge    HPI Sara Taylor is a 26 y.o. female with a PMHx of ovarian cysts, asthma, depression, and HTN, who presents to the ED with complaints of intermittent lower abdominal pain and vaginal discharge 1 week. She describes the pain is 6/10 sharp intermittent lower abdominal pain that radiates into her lower back, with no known arthritis factors, and unrelieved by ibuprofen. She also reports associated nausea and thick brownish vaginal discharge. She is sexually active with one female partner, unprotected. Her LMP was 12/23/16, and states her menses are irregular so it is not uncommon for her to miss a period. She denies fevers, chills, CP, SOB, vomiting, diarrhea/constipation, obstipation, melena, hematochezia, hematuria, dysuria, vaginal bleeding or itching, myalgias, arthralgias, numbness, tingling, focal weakness, or any other complaints at this time. Denies recent travel, sick contacts, suspicious food intake, EtOH use, NSAID use, recent abx, or prior abd surgeries. PCP is [facility].    The history is provided by the patient and medical records. No language interpreter was used.  Vaginal Discharge   Associated symptoms include abdominal pain and nausea. Pertinent negatives include no fever, no constipation, no diarrhea, no vomiting and no dysuria.  Abdominal Pain   This is a new problem. The current episode started more than 2 days ago. The problem occurs daily. The problem has not changed since onset.The pain is associated with an unknown factor. The pain is located in the suprapubic region, RLQ and LLQ. The quality of the pain is sharp. The pain is at a severity of 6/10. The pain is mild. Associated symptoms include nausea. Pertinent negatives include fever, diarrhea, flatus,  hematochezia, melena, vomiting, constipation, dysuria, hematuria, arthralgias and myalgias. Nothing aggravates the symptoms. Nothing relieves the symptoms.    Past Medical History:  Diagnosis Date  . Asthma   . Depression   . Hypertension   . Ovarian cyst     Patient Active Problem List   Diagnosis Date Noted  . Asthma 01/03/2016  . URI (upper respiratory infection) 11/17/2015  . Vaginal discharge 03/02/2014  . Preventative health care 06/02/2013  . Screening for STD (sexually transmitted disease) 06/02/2013  . Encounter for long-term (current) use of medications 05/26/2012  . History of chlamydia 05/26/2012  . TOBACCO USE 06/09/2008  . OBESITY, NOS 07/10/2006  . HYPERTENSION, BENIGN SYSTEMIC 07/10/2006  . RHINITIS, ALLERGIC 07/10/2006    History reviewed. No pertinent surgical history.  OB History    No data available       Home Medications    Prior to Admission medications   Medication Sig Start Date End Date Taking? Authorizing Provider  hydrochlorothiazide (HYDRODIURIL) 25 MG tablet Take 1 tablet (25 mg total) by mouth daily. Must have office visit for refills Patient taking differently: Take 25 mg by mouth daily at 12 noon. Must have office visit for refills 01/03/16   Delynn Flavin M, DO  metroNIDAZOLE (FLAGYL) 500 MG tablet Take 1 tablet (500 mg total) by mouth 2 (two) times daily. 12/24/16   Roxy Horseman, PA-C    Family History Family History  Problem Relation Age of Onset  . Hypertension Maternal Grandmother   . Hypertension Maternal Grandfather   . Hypertension Paternal Grandmother   . Diabetes Paternal Grandmother   . Hypertension Mother   .  Diabetes Mother   . Hypothyroidism Mother     Social History Social History  Substance Use Topics  . Smoking status: Former Smoker    Packs/day: 0.20    Types: Cigarettes    Start date: 08/08/2015    Quit date: 09/2015  . Smokeless tobacco: Former Neurosurgeon    Quit date: 08/08/2015  . Alcohol use 0.0  oz/week     Comment: occ     Allergies   Doxycycline   Review of Systems Review of Systems  Constitutional: Negative for chills and fever.  Respiratory: Negative for shortness of breath.   Cardiovascular: Negative for chest pain.  Gastrointestinal: Positive for abdominal pain and nausea. Negative for blood in stool, constipation, diarrhea, flatus, hematochezia, melena and vomiting.  Genitourinary: Positive for pelvic pain and vaginal discharge. Negative for dysuria, hematuria, vaginal bleeding and vaginal pain.  Musculoskeletal: Negative for arthralgias and myalgias.  Skin: Negative for color change.  Allergic/Immunologic: Negative for immunocompromised state.  Neurological: Negative for weakness and numbness.  Psychiatric/Behavioral: Negative for confusion.   All other systems reviewed and are negative for acute change except as noted in the HPI.    Physical Exam Updated Vital Signs BP (!) 159/114 (BP Location: Left Wrist)   Pulse 83   Temp 98 F (36.7 C) (Oral)   Resp 18   SpO2 99%   Physical Exam  Constitutional: She is oriented to person, place, and time. Vital signs are normal. She appears well-developed and well-nourished.  Non-toxic appearance. No distress.  Afebrile, nontoxic, NAD  HENT:  Head: Normocephalic and atraumatic.  Mouth/Throat: Oropharynx is clear and moist and mucous membranes are normal.  Eyes: Conjunctivae and EOM are normal. Right eye exhibits no discharge. Left eye exhibits no discharge.  Neck: Normal range of motion. Neck supple.  Cardiovascular: Normal rate, regular rhythm, normal heart sounds and intact distal pulses.  Exam reveals no gallop and no friction rub.   No murmur heard. Pulmonary/Chest: Effort normal and breath sounds normal. No respiratory distress. She has no decreased breath sounds. She has no wheezes. She has no rhonchi. She has no rales.  Abdominal: Soft. Normal appearance and bowel sounds are normal. She exhibits no distension.  There is no tenderness. There is no rigidity, no rebound, no guarding, no CVA tenderness, no tenderness at McBurney's point and negative Murphy's sign.  Morbidly obese which slightly limits exam Soft, not obviously distended but obesity limits exam; +BS throughout, with mild TTP to lower abdomen diffusely along pelvic brim, no r/g/r, neg murphy's, neg mcburney's, no CVA TTP   Genitourinary: Uterus normal. Pelvic exam was performed with patient supine. There is no rash, tenderness or lesion on the right labia. There is no rash, tenderness or lesion on the left labia. Cervix exhibits no motion tenderness, no discharge and no friability. Right adnexum displays tenderness. Right adnexum displays no mass and no fullness. Left adnexum displays tenderness. Left adnexum displays no mass and no fullness. There is bleeding in the vagina. No erythema or tenderness in the vagina. No vaginal discharge found.  Genitourinary Comments: Chaperone present for exam. No rashes, lesions, or tenderness to external genitalia. No erythema, injury, or tenderness to vaginal mucosa. No vaginal discharge noted, scant dark brownish old vaginal blood within vaginal vault. No adnexal masses or fullness but with mild b/l adnexal tenderness. No CMT, cervical friability, or discharge from cervical os. Cervical os is closed. Uterus non-deviated, mobile, nonTTP, and without enlargement.    Musculoskeletal: Normal range of motion.  Neurological: She  is alert and oriented to person, place, and time. She has normal strength. No sensory deficit.  Skin: Skin is warm, dry and intact. No rash noted.  Psychiatric: She has a normal mood and affect.  Nursing note and vitals reviewed.    ED Treatments / Results  Labs (all labs ordered are listed, but only abnormal results are displayed) Labs Reviewed  WET PREP, GENITAL - Abnormal; Notable for the following:       Result Value   WBC, Wet Prep HPF POC FEW (*)    All other components within  normal limits  URINALYSIS, ROUTINE W REFLEX MICROSCOPIC - Abnormal; Notable for the following:    APPearance CLOUDY (*)    Hgb urine dipstick LARGE (*)    Leukocytes, UA TRACE (*)    Bacteria, UA MANY (*)    Squamous Epithelial / LPF 6-30 (*)    All other components within normal limits  COMPREHENSIVE METABOLIC PANEL - Abnormal; Notable for the following:    BUN 5 (*)    Total Bilirubin 1.6 (*)    All other components within normal limits  CBC WITH DIFFERENTIAL/PLATELET  RPR  HIV ANTIBODY (ROUTINE TESTING)  POC URINE PREG, ED  GC/CHLAMYDIA PROBE AMP ([place]) NOT AT [facility]    EKG  EKG Interpretation None       Radiology US Pelvis Transvanginal Non-ob (tv Only)  Result Date: 02/17/2017 CLINICAL DATA:  Bilateral pelvic pain for 1 week. EXAM: TRANSABDOMINAL AND TRANSVAGINAL ULTRASOUND OF PELVIS DOPPLER ULTRASOUND OF OVARIES TECHNIQUE: Both transabdominal and transvaginal ultrasound examinations of the pelvis were performed. Transabdominal technique was performed for global imaging of the pelvis including uterus, ovaries, adnexal regions, and pelvic cul-de-sac. It was necessary to proceed with endovaginal exam following the transabdominal exam to visualize the ovaries. Color and duplex Doppler ultrasound was utilized to evaluate blood flow to the ovaries. COMPARISON:  06/12/2015 FINDINGS: Uterus Measurements: 7.3 x 2.7 x 3.4 cm. No fibroids or other mass visualized. Endometrium Thickness: 5 mm.  No focal abnormality visualized. Right ovary Measurements: 4.1 x 2.3 x 2.5 cm. Normal appearance/no adnexal mass. Left ovary Measurements: 2.3 x 1.9 x 1.9 cm. Normal appearance/no adnexal mass. Pulsed Doppler evaluation of both ovaries demonstrates normal low-resistance arterial and venous waveforms. Other findings No abnormal free fluid. IMPRESSION: Normal uterus and ovaries. Electronically Signed   By: Ellery Plunk M.D.   On: 02/17/2017 20:15   US Pelvis (transabdominal Only)  Result  Date: 02/17/2017 CLINICAL DATA:  Bilateral pelvic pain for 1 week. EXAM: TRANSABDOMINAL AND TRANSVAGINAL ULTRASOUND OF PELVIS DOPPLER ULTRASOUND OF OVARIES TECHNIQUE: Both transabdominal and transvaginal ultrasound examinations of the pelvis were performed. Transabdominal technique was performed for global imaging of the pelvis including uterus, ovaries, adnexal regions, and pelvic cul-de-sac. It was necessary to proceed with endovaginal exam following the transabdominal exam to visualize the ovaries. Color and duplex Doppler ultrasound was utilized to evaluate blood flow to the ovaries. COMPARISON:  06/12/2015 FINDINGS: Uterus Measurements: 7.3 x 2.7 x 3.4 cm. No fibroids or other mass visualized. Endometrium Thickness: 5 mm.  No focal abnormality visualized. Right ovary Measurements: 4.1 x 2.3 x 2.5 cm. Normal appearance/no adnexal mass. Left ovary Measurements: 2.3 x 1.9 x 1.9 cm. Normal appearance/no adnexal mass. Pulsed Doppler evaluation of both ovaries demonstrates normal low-resistance arterial and venous waveforms. Other findings No abnormal free fluid. IMPRESSION: Normal uterus and ovaries. Electronically Signed   By: Ellery Plunk M.D.   On: 02/17/2017 20:15   US Pelvic Doppler (torsion  R/o Or Mass Arterial Flow)  Result Date: 02/17/2017 CLINICAL DATA:  Bilateral pelvic pain for 1 week. EXAM: TRANSABDOMINAL AND TRANSVAGINAL ULTRASOUND OF PELVIS DOPPLER ULTRASOUND OF OVARIES TECHNIQUE: Both transabdominal and transvaginal ultrasound examinations of the pelvis were performed. Transabdominal technique was performed for global imaging of the pelvis including uterus, ovaries, adnexal regions, and pelvic cul-de-sac. It was necessary to proceed with endovaginal exam following the transabdominal exam to visualize the ovaries. Color and duplex Doppler ultrasound was utilized to evaluate blood flow to the ovaries. COMPARISON:  06/12/2015 FINDINGS: Uterus Measurements: 7.3 x 2.7 x 3.4 cm. No fibroids or  other mass visualized. Endometrium Thickness: 5 mm.  No focal abnormality visualized. Right ovary Measurements: 4.1 x 2.3 x 2.5 cm. Normal appearance/no adnexal mass. Left ovary Measurements: 2.3 x 1.9 x 1.9 cm. Normal appearance/no adnexal mass. Pulsed Doppler evaluation of both ovaries demonstrates normal low-resistance arterial and venous waveforms. Other findings No abnormal free fluid. IMPRESSION: Normal uterus and ovaries. Electronically Signed   By: Ellery Plunk M.D.   On: 02/17/2017 20:15    Procedures Procedures (including critical care time)  Medications Ordered in ED Medications - No data to display   Initial Impression / Assessment and Plan / ED Course  I have reviewed the triage vital signs and the nursing notes.  Pertinent labs & imaging results that were available during my care of the patient were reviewed by me and considered in my medical decision making (see chart for details).     26 y.o. female here with lower abd pain and vaginal discharge x1 wk as well as nausea. On exam, mild lower abd TTP near pelvic brim, nonperitoneal. Pt declines wanting anything for pain or nausea. Upreg neg. U/A contaminated so hard to determine if there's a UTI component to her complaints, however she has no UTI symptoms; will proceed with pelvic examination first and then decide if we need to empirically treat for a UTI or not. Doubt UCx would be helpful given level of contamination. Will get CBC/CMP and STD testing as well. Will reassess shortly  6:07 PM Pelvic exam reveals scant brownish old vaginal blood in vaginal vault, no discharge, no bright red blood in vaginal vault; actually looks like this is just menstrual blood rather than discharge. Also with diffuse tenderness to b/l adnexa. Will proceed with pelvic U/S and await further work up. Will reassess shortly  8:30 PM CBC w/diff WNL. CMP with marginally elevated Tbili at 1.6, likely just dehydration related, doubt clinical  significance. Wet prep with no trichomonas/yeast/clue cells and only a few WBCs; doubt need for empiric tx of GC/CT given lack of clinical findings and lack of WBCs on wet prep. Her exam lends itself to just having a menstrual cycel at this time. Pelvic U/S completely unremarkable. Given no clear etiology of her symptoms/lower abd pain, and questionable U/A, will empirically treat for UTI just in case; could also be just normal menstrual cycle pain. Advised tylenol/motrin/heat use, will rx zofran as well, discussed staying hydrated. F/up with PCP in 1wk for recheck of symptoms and ongoing management of her DUB. Discussed abstinence until she has her STD results, and f/up with health dept for future STD concerns/treatment/etc; safe sex encouraged, and discussed having partners tested and treated before re-engaging in intercourse. I explained the diagnosis and have given explicit precautions to return to the ER including for any other new or worsening symptoms. The patient understands and accepts the medical plan as it's been dictated and I have answered  their questions. Discharge instructions concerning home care and prescriptions have been given. The patient is STABLE and is discharged to home in good condition.    Final Clinical Impressions(s) / ED Diagnoses   Final diagnoses:  Pelvic pain  Pelvic pain in female  Lower abdominal pain  DUB (dysfunctional uterine bleeding)  Nausea  Lower urinary tract infectious disease    New Prescriptions New Prescriptions   CEPHALEXIN (KEFLEX) 500 MG CAPSULE    2 caps po bid x 7 days   ONDANSETRON (ZOFRAN ODT) 4 MG DISINTEGRATING TABLET    Take 1 tablet (4 mg total) by mouth every 8 (eight) hours as needed for nausea or vomiting.     [address], [place], [place] 02/17/17 2031    Arby Barrette, MD 02/18/17 [phone]

## 2017-02-17 NOTE — ED Triage Notes (Signed)
Pt complains of lower abdominal pain/back pain, vaginal discharge for the past week. Pt states she believes she had bacterial vaginosis.

## 2017-02-17 NOTE — Discharge Instructions (Addendum)
Your work up today has been reassuring. The discharge you're having appears to actually be a menstrual cycle rather than actual discharge; this could explain your symptoms. Your urine specimen appears as though you may have a urinary tract infection, take antibiotic as directed until completed and stay well hydrated. Alternate between tylenol and motrin as needed for pain. Use heat pad/compress to help with pain. Use zofran as directed as needed for nausea. You have been tested for gonorrhea, chlamydia, HIV, and Syphilis, and the hospital will call you if the lab is positive. DO NOT ENGAGE IN SEXUAL ACTIVITY UNTIL YOU FIND OUT ABOUT YOUR RESULTS AND HAVE PARTNERS TESTED AND TREATED. ALL PARTNERS MUST BE TESTED AND TREATED FOR STD'S. ALWAYS USE CONDOMS WHEN ENGAGING IN INTERCOURSE. Follow up with [facility] STD clinic for future STD concerns or screenings.  Follow up with your regular doctor in 1 week for recheck of symptoms. Go to the [facility] hospital emergency department (called the [facility]) for any changes or worsening symptoms.

## 2017-02-18 LAB — GC/CHLAMYDIA PROBE AMP (~~LOC~~) NOT AT ARMC
CHLAMYDIA, DNA PROBE: NEGATIVE
Neisseria Gonorrhea: NEGATIVE

## 2017-02-18 LAB — HIV ANTIBODY (ROUTINE TESTING W REFLEX): HIV Screen 4th Generation wRfx: NONREACTIVE

## 2017-02-18 LAB — RPR: RPR: NONREACTIVE

## 2017-07-07 ENCOUNTER — Other Ambulatory Visit: Payer: Self-pay

## 2017-07-07 ENCOUNTER — Encounter (HOSPITAL_COMMUNITY): Payer: Self-pay | Admitting: Emergency Medicine

## 2017-07-07 DIAGNOSIS — I10 Essential (primary) hypertension: Secondary | ICD-10-CM | POA: Insufficient documentation

## 2017-07-07 DIAGNOSIS — Z87891 Personal history of nicotine dependence: Secondary | ICD-10-CM | POA: Insufficient documentation

## 2017-07-07 DIAGNOSIS — N898 Other specified noninflammatory disorders of vagina: Secondary | ICD-10-CM | POA: Insufficient documentation

## 2017-07-07 DIAGNOSIS — Z79899 Other long term (current) drug therapy: Secondary | ICD-10-CM | POA: Insufficient documentation

## 2017-07-07 NOTE — ED Triage Notes (Signed)
Pt reports having vaginal discharge since 07/04/17 that is white in color. Pt also reports foul odor. Denies any urinary symptoms.

## 2017-07-08 ENCOUNTER — Emergency Department (HOSPITAL_COMMUNITY)
Admission: EM | Admit: 2017-07-08 | Discharge: 2017-07-08 | Disposition: A | Discharge status: Home / Self Care | Payer: Self-pay | Attending: Emergency Medicine | Admitting: Emergency Medicine

## 2017-07-08 DIAGNOSIS — N898 Other specified noninflammatory disorders of vagina: Secondary | ICD-10-CM

## 2017-07-08 DIAGNOSIS — I10 Essential (primary) hypertension: Secondary | ICD-10-CM

## 2017-07-08 LAB — WET PREP, GENITAL
Clue Cells Wet Prep HPF POC: NONE SEEN
Sperm: NONE SEEN
Trich, Wet Prep: NONE SEEN
YEAST WET PREP: NONE SEEN

## 2017-07-08 LAB — POC URINE PREG, ED: PREG TEST UR: NEGATIVE

## 2017-07-08 LAB — URINALYSIS, ROUTINE W REFLEX MICROSCOPIC
Bilirubin Urine: NEGATIVE
Glucose, UA: NEGATIVE mg/dL
HGB URINE DIPSTICK: NEGATIVE
Ketones, ur: 5 mg/dL — AB
Leukocytes, UA: NEGATIVE
NITRITE: NEGATIVE
PROTEIN: NEGATIVE mg/dL
Specific Gravity, Urine: 1.028 (ref 1.005–1.030)
pH: 5 (ref 5.0–8.0)

## 2017-07-08 LAB — GC/CHLAMYDIA PROBE AMP (~~LOC~~) NOT AT ARMC
Chlamydia: NEGATIVE
NEISSERIA GONORRHEA: NEGATIVE

## 2017-07-08 MED ORDER — LIDOCAINE HCL (PF) 2 % IJ SOLN
INTRAMUSCULAR | Status: AC
  Filled 2017-07-08: qty 10

## 2017-07-08 MED ORDER — CEFTRIAXONE SODIUM 250 MG IJ SOLR
250.0000 mg | Freq: Once | INTRAMUSCULAR | Status: AC
  Administered 2017-07-08: 250 mg via INTRAMUSCULAR
  Filled 2017-07-08: qty 250

## 2017-07-08 MED ORDER — AZITHROMYCIN 250 MG PO TABS
1000.0000 mg | ORAL_TABLET | Freq: Once | ORAL | Status: AC
Start: 2017-07-08 — End: 2017-07-08
  Administered 2017-07-08: 1000 mg via ORAL
  Filled 2017-07-08: qty 4

## 2017-07-08 NOTE — Discharge Instructions (Signed)
Please take your blood pressure at home every day and keep a log of this.  Your blood pressure is elevated today.  Please take your HCTZ as prescribed and follow-up closely with your primary care doctor.  You ever develop severe headache, changes in her vision such as flashers or floaters or loss of vision, chest pain or shortness of breath, please return to the emergency department.  You have been treated empirically today for gonorrhea and chlamydia.  We have also tested you for HIV and syphilis.  These tests will take at least 3 days to come back.  You may follow-up on the results in my chart.  Please avoid any sexual intercourse including vaginal, anal or oral intercourse for the next week until you have your results.  If you are positive for any STDs, your partner will need to be tested and treated as well.

## 2017-07-08 NOTE — ED Provider Notes (Signed)
TIME SEEN: 4:35 AM  CHIEF COMPLAINT: Vaginal discharge  HPI: Patient is a 27 year old female with history of morbid obesity, hypertension who presents emergency department with 3-4 days of yellow foul-smelling vaginal discharge.  Sexually active with one female partner and does not always use protection.  Has had chlamydia in the past.  No dysuria or hematuria.  No abdominal pain.  No fever.  No vomiting or diarrhea.  ROS: See HPI Constitutional: no fever  Eyes: no drainage  ENT: no runny nose   Cardiovascular:  no chest pain  Resp: no SOB  GI: no vomiting GU: no dysuria Integumentary: no rash  Allergy: no hives  Musculoskeletal: no leg swelling  Neurological: no slurred speech ROS otherwise negative  PAST MEDICAL HISTORY/PAST SURGICAL HISTORY:  Past Medical History:  Diagnosis Date  . Asthma   . Depression   . Hypertension   . Ovarian cyst     MEDICATIONS:  Prior to Admission medications   Medication Sig Start Date End Date Taking? Authorizing Provider  cephALEXin (KEFLEX) 500 MG capsule 2 caps po bid x 7 days 02/17/17   Street, [place], PA-C  hydrochlorothiazide (HYDRODIURIL) 25 MG tablet Take 1 tablet (25 mg total) by mouth daily. Must have office visit for refills Patient taking differently: Take 25 mg by mouth daily at 12 noon. Must have office visit for refills 01/03/16   Delynn FlavinGottschalk, Ashly M, DO  metroNIDAZOLE (FLAGYL) 500 MG tablet Take 1 tablet (500 mg total) by mouth 2 (two) times daily. Patient not taking: Reported on 02/17/2017 12/24/16   Roxy HorsemanBrowning, Robert, PA-C  ondansetron (ZOFRAN ODT) 4 MG disintegrating tablet Take 1 tablet (4 mg total) by mouth every 8 (eight) hours as needed for nausea or vomiting. 02/17/17   Street, [place]cedes, PA-C    ALLERGIES:  Allergies  Allergen Reactions  . Doxycycline Hives    SOCIAL HISTORY:  Social History   Tobacco Use  . Smoking status: Former Smoker    Packs/day: 0.20    Types: Cigarettes    Start date: 08/08/2015    Last  attempt to quit: 09/2015    Years since quitting: 1.8  . Smokeless tobacco: Former NeurosurgeonUser    Quit date: 08/08/2015  Substance Use Topics  . Alcohol use: Yes    Alcohol/week: 0.0 oz    Comment: occ    FAMILY HISTORY: Family History  Problem Relation Age of Onset  . Hypertension Maternal Grandmother   . Hypertension Maternal Grandfather   . Hypertension Paternal Grandmother   . Diabetes Paternal Grandmother   . Hypertension Mother   . Diabetes Mother   . Hypothyroidism Mother     EXAM: BP (!) 157/122 (BP Location: Left Wrist)   Pulse 82   Temp 98.1 F (36.7 C) (Oral)   Resp 16   Ht 5\' 5"  (1.651 m)   Wt (!) 150.6 kg (332 lb)   LMP 05/29/2017   SpO2 99%   BMI 55.25 kg/m  CONSTITUTIONAL: Alert and oriented and responds appropriately to questions. Well-appearing; well-nourished morbidly obese HEAD: Normocephalic EYES: Conjunctivae clear, pupils appear equal, EOMI ENT: normal nose; moist mucous membranes NECK: Supple, no meningismus, no nuchal rigidity, no LAD  CARD: RRR; S1 and S2 appreciated; no murmurs, no clicks, no rubs, no gallops RESP: Normal chest excursion without splinting or tachypnea; breath sounds clear and equal bilaterally; no wheezes, no rhonchi, no rales, no hypoxia or respiratory distress, speaking full sentences ABD/GI: Normal bowel sounds; non-distended; soft, non-tender, no rebound, no guarding, no peritoneal signs, no  hepatosplenomegaly GU:  Normal external genitalia. No lesions, rashes noted. Patient has no vaginal bleeding on exam.  Foul-smelling yellow thin vaginal discharge.  No adnexal tenderness, mass or fullness, no cervical motion tenderness. Cervix is not appear friable.  Cervix is closed.  Chaperone present for exam. BACK:  The back appears normal and is non-tender to palpation, there is no CVA tenderness EXT: Normal ROM in all joints; non-tender to palpation; no edema; normal capillary refill; no cyanosis, no calf tenderness or swelling    SKIN:  Normal color for age and race; warm; no rash NEURO: Moves all extremities equally, normal speech, cranial nerves II through XII intact, normal sensation diffusely, normal gait PSYCH: The patient's mood and manner are appropriate. Grooming and personal hygiene are appropriate.  MEDICAL DECISION MAKING: Patient here with vaginal discharge.  Noted to be hypertensive in the waiting room.  We will recheck her vital signs.  Urine shows no sign of infection.  Pregnancy test is negative.  Pelvic cultures have been sent.  Abdominal exam benign.  Doubt ectopic, torsion, TOA, PID based on benign exam.  Doubt appendicitis.  ED PROGRESS: Patient's wet prep shows moderate white blood cells but no other acute abnormality.  Given she does have foul-smelling yellow discharge on exam I have recommended empiric treatment for gonorrhea and chlamydia and she agrees.  Will give ceftriaxone and azithromycin here.  We will also check her for HIV and syphilis per her request.  She is hypertensive and states she has been compliant with her HCTZ.  Denies headache, vision changes, chest pain, shortness of breath, numbness, tingling or focal weakness.  States that she is always hypertensive especially after getting "my hair done".  She declines any blood pressure control today.  Recommend close follow-up with her PCP for this.  Discussed at length return precautions.   At this time, I do not feel there is any life-threatening condition present. I have reviewed and discussed all results (EKG, imaging, lab, urine as appropriate) and exam findings with patient/family. I have reviewed nursing notes and appropriate previous records.  I feel the patient is safe to be discharged home without further emergent workup and can continue workup as an outpatient as needed. Discussed usual and customary return precautions. Patient/family verbalize understanding and are comfortable with this plan.  Outpatient follow-up has been provided if needed. All  questions have been answered.      Reizy Dunlow, Layla Maw, DO 07/08/17 [phone]

## 2017-07-08 NOTE — ED Notes (Signed)
Will recheck blood pressure after obtaining lab draw.

## 2017-07-08 NOTE — ED Notes (Signed)
Pt getting undressed and into a gown; pelvic cart placed outside of room.

## 2017-07-09 LAB — RPR: RPR: NONREACTIVE

## 2017-07-09 LAB — HIV ANTIBODY (ROUTINE TESTING W REFLEX): HIV SCREEN 4TH GENERATION: NONREACTIVE

## 2017-09-10 ENCOUNTER — Encounter (HOSPITAL_COMMUNITY): Payer: Self-pay | Admitting: Emergency Medicine

## 2017-09-10 ENCOUNTER — Ambulatory Visit (HOSPITAL_COMMUNITY)
Admission: EM | Admit: 2017-09-10 | Discharge: 2017-09-10 | Disposition: A | Discharge status: Home / Self Care | Payer: Self-pay | Attending: Family Medicine | Admitting: Family Medicine

## 2017-09-10 DIAGNOSIS — Z3202 Encounter for pregnancy test, result negative: Secondary | ICD-10-CM

## 2017-09-10 DIAGNOSIS — Z87891 Personal history of nicotine dependence: Secondary | ICD-10-CM | POA: Insufficient documentation

## 2017-09-10 DIAGNOSIS — Z79899 Other long term (current) drug therapy: Secondary | ICD-10-CM | POA: Insufficient documentation

## 2017-09-10 DIAGNOSIS — N898 Other specified noninflammatory disorders of vagina: Secondary | ICD-10-CM

## 2017-09-10 DIAGNOSIS — Z7251 High risk heterosexual behavior: Secondary | ICD-10-CM

## 2017-09-10 DIAGNOSIS — N3001 Acute cystitis with hematuria: Secondary | ICD-10-CM | POA: Insufficient documentation

## 2017-09-10 DIAGNOSIS — R319 Hematuria, unspecified: Secondary | ICD-10-CM

## 2017-09-10 DIAGNOSIS — I10 Essential (primary) hypertension: Secondary | ICD-10-CM | POA: Insufficient documentation

## 2017-09-10 DIAGNOSIS — R102 Pelvic and perineal pain: Secondary | ICD-10-CM | POA: Insufficient documentation

## 2017-09-10 DIAGNOSIS — Z8249 Family history of ischemic heart disease and other diseases of the circulatory system: Secondary | ICD-10-CM | POA: Insufficient documentation

## 2017-09-10 DIAGNOSIS — Z881 Allergy status to other antibiotic agents status: Secondary | ICD-10-CM | POA: Insufficient documentation

## 2017-09-10 DIAGNOSIS — Z202 Contact with and (suspected) exposure to infections with a predominantly sexual mode of transmission: Secondary | ICD-10-CM

## 2017-09-10 DIAGNOSIS — N83209 Unspecified ovarian cyst, unspecified side: Secondary | ICD-10-CM | POA: Insufficient documentation

## 2017-09-10 LAB — POCT URINALYSIS DIP (DEVICE)
Bilirubin Urine: NEGATIVE
Glucose, UA: NEGATIVE mg/dL
Ketones, ur: NEGATIVE mg/dL
NITRITE: NEGATIVE
PH: 7.5 (ref 5.0–8.0)
PROTEIN: NEGATIVE mg/dL
Specific Gravity, Urine: 1.02 (ref 1.005–1.030)
Urobilinogen, UA: 0.2 mg/dL (ref 0.0–1.0)

## 2017-09-10 LAB — POCT PREGNANCY, URINE: PREG TEST UR: NEGATIVE

## 2017-09-10 MED ORDER — FLUCONAZOLE 200 MG PO TABS: 200.0000 mg | ORAL_TABLET | Freq: Every day | ORAL | 0 refills | Status: AC

## 2017-09-10 MED ORDER — AZITHROMYCIN 250 MG PO TABS
ORAL_TABLET | ORAL | Status: AC
  Filled 2017-09-10: qty 4

## 2017-09-10 MED ORDER — METRONIDAZOLE 500 MG PO TABS: 500.0000 mg | ORAL_TABLET | Freq: Two times a day (BID) | ORAL | 0 refills | Status: AC

## 2017-09-10 MED ORDER — CEFTRIAXONE SODIUM 250 MG IJ SOLR
INTRAMUSCULAR | Status: AC
  Filled 2017-09-10: qty 250

## 2017-09-10 MED ORDER — AZITHROMYCIN 250 MG PO TABS
1000.0000 mg | ORAL_TABLET | Freq: Once | ORAL | Status: AC
  Administered 2017-09-10: 1000 mg via ORAL

## 2017-09-10 MED ORDER — CEFTRIAXONE SODIUM 250 MG IJ SOLR
250.0000 mg | Freq: Once | INTRAMUSCULAR | Status: AC
  Administered 2017-09-10: 250 mg via INTRAMUSCULAR

## 2017-09-10 MED ORDER — NITROFURANTOIN MONOHYD MACRO 100 MG PO CAPS: 100.0000 mg | ORAL_CAPSULE | Freq: Two times a day (BID) | ORAL | 0 refills | Status: AC

## 2017-09-10 NOTE — ED Triage Notes (Signed)
Pt sts vaginal discharge and irritation 

## 2017-09-10 NOTE — ED Provider Notes (Signed)
[facility]    CSN: [id number] Arrival date & time: 09/10/17  1409     History   Chief Complaint Chief Complaint  Patient presents with  . Vaginal Discharge    HPI Sara Taylor is a 27 y.o. female.   Complains of vaginal discharge and irritation that began 4 days ago.  Last sexual encounter was about 2 weeks ago, and she did not use protection. She describes the discharge as thick and tan color.  She has not tried OTC medications.  She denies aggravating symptoms.  Patient reports similar symptoms in the past and was diagnosed with BV and yeast.  She was treated with medications and her symptoms resolved.  She complains of vaginal odor (fishy smell), and vaginal pain. She denies fever, chills, nausea, vomiting, vaginal bleeding, dyspareunia, and vaginal lesions.     Past Medical History:  Diagnosis Date  . Asthma   . Depression   . Hypertension   . Ovarian cyst     Patient Active Problem List   Diagnosis Date Noted  . Asthma 01/03/2016  . URI (upper respiratory infection) 11/17/2015  . Vaginal discharge 03/02/2014  . Preventative health care 06/02/2013  . Screening for STD (sexually transmitted disease) 06/02/2013  . Encounter for long-term (current) use of medications 05/26/2012  . History of chlamydia 05/26/2012  . TOBACCO USE 06/09/2008  . OBESITY, NOS 07/10/2006  . HYPERTENSION, BENIGN SYSTEMIC 07/10/2006  . RHINITIS, ALLERGIC 07/10/2006    History reviewed. No pertinent surgical history.  OB History   None      Home Medications    Prior to Admission medications   Medication Sig Start Date End Date Taking? Authorizing Provider  cephALEXin (KEFLEX) 500 MG capsule 2 caps po bid x 7 days Patient not taking: Reported on 07/08/2017 02/17/17   Street, [place], PA-C  fluconazole (DIFLUCAN) 200 MG tablet Take 1 tablet (200 mg total) by mouth daily for 2 days. Take one pill by mouth and then wait 72 hours, then take second pill by mouth 09/10/17  09/12/17  Nandi Tonnesen, [place], PA-C  hydrochlorothiazide (HYDRODIURIL) 25 MG tablet Take 1 tablet (25 mg total) by mouth daily. Must have office visit for refills Patient taking differently: Take 25 mg by mouth daily at 12 noon. Must have office visit for refills 01/03/16   Delynn Flavin M, DO  metroNIDAZOLE (FLAGYL) 500 MG tablet Take 1 tablet (500 mg total) by mouth 2 (two) times daily for 7 days. 09/10/17 09/17/17  Chalyn Amescua, [place], PA-C  nitrofurantoin, macrocrystal-monohydrate, (MACROBID) 100 MG capsule Take 1 capsule (100 mg total) by mouth 2 (two) times daily for 7 days. 09/10/17 09/17/17  Harolyn Cocker, Lowanda Foster, PA-C  ondansetron (ZOFRAN ODT) 4 MG disintegrating tablet Take 1 tablet (4 mg total) by mouth every 8 (eight) hours as needed for nausea or vomiting. Patient not taking: Reported on 07/08/2017 02/17/17   Street, [place], PA-C    Family History Family History  Problem Relation Age of Onset  . Hypertension Maternal Grandmother   . Hypertension Maternal Grandfather   . Hypertension Paternal Grandmother   . Diabetes Paternal Grandmother   . Hypertension Mother   . Diabetes Mother   . Hypothyroidism Mother     Social History Social History   Tobacco Use  . Smoking status: Former Smoker    Packs/day: 0.20    Types: Cigarettes    Start date: 08/08/2015    Last attempt to quit: 09/2015    Years since quitting: 2.0  . Smokeless tobacco: Former  User    Quit date: 08/08/2015  Substance Use Topics  . Alcohol use: Yes    Alcohol/week: 0.0 oz    Comment: occ  . Drug use: No     Allergies   Doxycycline   Review of Systems Review of Systems  Constitutional: Negative for chills and fever.  Respiratory: Negative for shortness of breath.   Cardiovascular: Negative for chest pain.  Gastrointestinal: Negative for abdominal pain, nausea and vomiting.  Genitourinary: Positive for frequency, hematuria, vaginal discharge and vaginal pain. Negative for difficulty urinating, dysuria, flank pain,  genital sores, pelvic pain, urgency and vaginal bleeding.  Musculoskeletal: Negative for back pain.  Skin: Negative for rash.  Neurological: Negative for headaches.     Physical Exam Triage Vital Signs ED Triage Vitals [09/10/17 1457]  Enc Vitals Group     BP 119/85     Pulse Rate 70     Resp 18     Temp 98 F (36.7 C)     Temp Source Oral     SpO2 97 %     Weight      Height      Head Circumference      Peak Flow      Pain Score      Pain Loc      Pain Edu?      Excl. in GC?    No data found.  Updated Vital Signs BP 119/85 (BP Location: Right Arm)   Pulse 70   Temp 98 F (36.7 C) (Oral)   Resp 18   SpO2 97%   Physical Exam  Constitutional: She is oriented to person, place, and time. No distress.  Morbidly obese   HENT:  Head: Normocephalic and atraumatic.  Right Ear: External ear normal.  Left Ear: External ear normal.  Nose: Nose normal.  Mouth/Throat: Oropharynx is clear and moist. No oropharyngeal exudate.  Eyes: Pupils are equal, round, and reactive to light. EOM are normal. No scleral icterus.  Neck: Normal range of motion. Neck supple.  Cardiovascular: Normal rate, regular rhythm and normal heart sounds. Exam reveals no gallop and no friction rub.  No murmur heard. Radial pulse 2+ bilaterally    Pulmonary/Chest: Effort normal and breath sounds normal. No stridor. No respiratory distress. She has no wheezes. She has no rales.  Abdominal: Soft. Bowel sounds are normal. There is no tenderness. There is no guarding.  Genitourinary: Vaginal discharge found.  Genitourinary Comments: On external examination no obvious lesions, discharge, or masses Bimanual exam performed prior to speculum exam. Negative cervical motion or adenexal tenderness Speculum exam: Thin yellow discharge appreciated during pelvic exam.    Musculoskeletal:  Ambulates from chair to exam table without difficulty  Lymphadenopathy:    She has no cervical adenopathy.  Neurological: She  is alert and oriented to person, place, and time.  Skin: Skin is warm and dry. Capillary refill takes 2 to 3 seconds. She is not diaphoretic.  Psychiatric: She has a normal mood and affect. Her behavior is normal. Judgment and thought content normal.  Vitals reviewed.    UC Treatments / Results  Labs (all labs ordered are listed, but only abnormal results are displayed) Labs Reviewed  POCT URINALYSIS DIP (DEVICE) - Abnormal; Notable for the following components:      Result Value   Hgb urine dipstick SMALL (*)    Leukocytes, UA SMALL (*)    All other components within normal limits  URINE CULTURE  POCT PREGNANCY, URINE  CERVICOVAGINAL ANCILLARY  ONLY    EKG None  Radiology No results found.  Procedures Procedures (including critical care time)  Medications Ordered in UC Medications  azithromycin (ZITHROMAX) tablet 1,000 mg (1,000 mg Oral Given 09/10/17 1546)  cefTRIAXone (ROCEPHIN) injection 250 mg (250 mg Intramuscular Given 09/10/17 1546)    Initial Impression / Assessment and Plan / UC Course  I have reviewed the triage vital signs and the nursing notes.  Pertinent labs & imaging results that were available during my care of the patient were reviewed by me and considered in my medical decision making (see chart for details).    Patient was treated empirically for GC, chlamydia, trich, BV and candidasis. Azithromycin and Rocephin given in office today. Prescribed diflucan and metronidazole.  Cytology sent, patient will be contacted with any positive results that require additional treatment. Patient to refrain from sexual activity for the next 7 days.   Urine was also positive for small blood and leukocytes.  Prescribed nitrofuratonin for UTI.  Instructed to increase fluids.  Will follow up with PCP if symptoms persists.    Return and ER precautions given Final Clinical Impressions(s) / UC Diagnoses   Final diagnoses:  Vaginal discharge  Acute cystitis with hematuria    Unprotected sexual intercourse     Discharge Instructions     Abstain from sexual activity for at least 7 days.  Please notified your partner(s) if any of your results are positive.   Given rocephin injection and azithromycin in office Prescribed metronidazole and diflucan.  Take as directed and to completion.  Do not drink alcohol while taking metronidazole.   Prescribed nitrofurantoin for urinary tract infection.  Take medication as directed and to completion Urine sent out for culture Drink plenty of fluids We will follow up with you regarding the results of your tests Follow up with PCP if symptoms persists Return here or go to ER if you have any new or worsening symptoms      ED Prescriptions    Medication Sig Dispense Auth. Provider   metroNIDAZOLE (FLAGYL) 500 MG tablet Take 1 tablet (500 mg total) by mouth 2 (two) times daily for 7 days. 14 tablet Massiah Minjares, [place], PA-C   fluconazole (DIFLUCAN) 200 MG tablet Take 1 tablet (200 mg total) by mouth daily for 2 days. Take one pill by mouth and then wait 72 hours, then take second pill by mouth 2 tablet Nikol Lemar, [place], PA-C   nitrofurantoin, macrocrystal-monohydrate, (MACROBID) 100 MG capsule Take 1 capsule (100 mg total) by mouth 2 (two) times daily for 7 days. 14 capsule Alvino Chapel, [place], PA-C     Controlled Substance Prescriptions [place] Controlled Substance Registry consulted? Not Applicable   Rennis Harding, [place] 09/10/17 1550

## 2017-09-10 NOTE — Discharge Instructions (Addendum)
Abstain from sexual activity for at least 7 days.  Please notified your partner(s) if any of your results are positive.   Given rocephin injection and azithromycin in office Prescribed metronidazole and diflucan.  Take as directed and to completion.  Do not drink alcohol while taking metronidazole.   Prescribed nitrofurantoin for urinary tract infection.  Take medication as directed and to completion Urine sent out for culture Drink plenty of fluids We will follow up with you regarding the results of your tests Follow up with PCP if symptoms persists Return here or go to ER if you have any new or worsening symptoms

## 2017-09-11 LAB — CERVICOVAGINAL ANCILLARY ONLY
Bacterial vaginitis: NEGATIVE
CANDIDA VAGINITIS: NEGATIVE
CHLAMYDIA, DNA PROBE: NEGATIVE
Neisseria Gonorrhea: NEGATIVE
TRICH (WINDOWPATH): POSITIVE — AB

## 2017-09-11 NOTE — Progress Notes (Signed)
Trichomonas is positive. Rx metronidazole was given at the urgent care visit. Attempted to contact patient. No answer, no voicemail available. Will try again.

## 2017-09-12 LAB — URINE CULTURE

## 2017-09-15 NOTE — Progress Notes (Signed)
Attempted to reach patient x2. No answer at this time. Voicemail left.    

## 2017-09-15 NOTE — Progress Notes (Signed)
Pt called and is aware of results. Educated on safe sex practices.

## 2018-02-09 ENCOUNTER — Telehealth: Payer: Self-pay | Admitting: Family Medicine

## 2018-02-09 NOTE — Telephone Encounter (Signed)
Left general message for pt regarding health maintenance appt.  °

## 2018-02-19 ENCOUNTER — Encounter (HOSPITAL_COMMUNITY): Payer: Self-pay | Admitting: Emergency Medicine

## 2018-02-19 ENCOUNTER — Other Ambulatory Visit: Payer: Self-pay

## 2018-02-19 ENCOUNTER — Ambulatory Visit (HOSPITAL_COMMUNITY)
Admission: EM | Admit: 2018-02-19 | Discharge: 2018-02-19 | Disposition: A | Discharge status: Home / Self Care | Payer: Self-pay | Attending: Family Medicine | Admitting: Family Medicine

## 2018-02-19 DIAGNOSIS — N762 Acute vulvitis: Secondary | ICD-10-CM

## 2018-02-19 DIAGNOSIS — N9089 Other specified noninflammatory disorders of vulva and perineum: Secondary | ICD-10-CM | POA: Insufficient documentation

## 2018-02-19 DIAGNOSIS — F17210 Nicotine dependence, cigarettes, uncomplicated: Secondary | ICD-10-CM | POA: Insufficient documentation

## 2018-02-19 MED ORDER — MUPIROCIN 2 % EX OINT: 1.0000 "application " | TOPICAL_OINTMENT | Freq: Two times a day (BID) | CUTANEOUS | 0 refills | Status: DC

## 2018-02-19 NOTE — ED Provider Notes (Signed)
[facility]    CSN: [id number] Arrival date & time: 02/19/18  1751     History   Chief Complaint Chief Complaint  Patient presents with  . Cyst    HPI Sara Taylor is a 27 y.o. female history of hypertension, asthma, depression presenting today for evaluation of a lesion in her vaginal area.  Patient states that over the past 3 days she has noticed a lesion above her clitoris.  It is irritated with water urine touches this area, denies drainage.  Denies previous issues similar to this.  She has not put anything on it.  Denies vaginal discharge or increase in frequency of urination.  Denies vaginal irritation or itching.  Patient declines concern for STDs.  HPI  Past Medical History:  Diagnosis Date  . Asthma   . Depression   . Hypertension   . Ovarian cyst     Patient Active Problem List   Diagnosis Date Noted  . Asthma 01/03/2016  . URI (upper respiratory infection) 11/17/2015  . Vaginal discharge 03/02/2014  . Preventative health care 06/02/2013  . Screening for STD (sexually transmitted disease) 06/02/2013  . Encounter for long-term (current) use of medications 05/26/2012  . History of chlamydia 05/26/2012  . TOBACCO USE 06/09/2008  . OBESITY, NOS 07/10/2006  . HYPERTENSION, BENIGN SYSTEMIC 07/10/2006  . RHINITIS, ALLERGIC 07/10/2006    No past surgical history on file.  OB History   None      Home Medications    Prior to Admission medications   Medication Sig Start Date End Date Taking? Authorizing Provider  hydrochlorothiazide (HYDRODIURIL) 25 MG tablet Take 1 tablet (25 mg total) by mouth daily. Must have office visit for refills Patient taking differently: Take 25 mg by mouth daily at 12 noon. Must have office visit for refills 01/03/16   Delynn Flavin M, DO  mupirocin ointment (BACTROBAN) 2 % Apply 1 application topically 2 (two) times daily. 02/19/18   Rishav Rockefeller, Junius [place]er, PA-C    Family History Family History  Problem Relation  Age of Onset  . Hypertension Maternal Grandmother   . Hypertension Maternal Grandfather   . Hypertension Paternal Grandmother   . Diabetes Paternal Grandmother   . Hypertension Mother   . Diabetes Mother   . Hypothyroidism Mother     Social History Social History   Tobacco Use  . Smoking status: Current Every Day Smoker    Packs/day: 0.20    Types: Cigarettes    Start date: 08/08/2015    Last attempt to quit: 09/2015    Years since quitting: 2.4  . Smokeless tobacco: Former Neurosurgeon    Quit date: 08/08/2015  Substance Use Topics  . Alcohol use: Yes    Alcohol/week: 0.0 standard drinks    Comment: occ  . Drug use: No     Allergies   Doxycycline   Review of Systems Review of Systems  Constitutional: Negative for fatigue and fever.  HENT: Negative for mouth sores.   Eyes: Negative for visual disturbance.  Respiratory: Negative for shortness of breath.   Cardiovascular: Negative for chest pain.  Gastrointestinal: Negative for abdominal pain, nausea and vomiting.  Genitourinary: Positive for genital sores. Negative for difficulty urinating, dysuria, vaginal bleeding and vaginal discharge.  Musculoskeletal: Negative for arthralgias and joint swelling.  Skin: Positive for color change and rash. Negative for wound.  Neurological: Negative for dizziness, weakness, light-headedness and headaches.     Physical Exam Triage Vital Signs ED Triage Vitals  Enc Vitals Group  BP 02/19/18 1833 (!) 152/86     Pulse Rate 02/19/18 1833 68     Resp 02/19/18 1833 18     Temp 02/19/18 1833 98.2 F (36.8 C)     Temp Source 02/19/18 1833 Oral     SpO2 02/19/18 1833 99 %     Weight --      Height --      Head Circumference --      Peak Flow --      Pain Score 02/19/18 1832 5     Pain Loc --      Pain Edu? --      Excl. in GC? --    No data found.  Updated Vital Signs BP (!) 152/86 (BP Location: Left Arm)   Pulse 68   Temp 98.2 F (36.8 C) (Oral)   Resp 18   SpO2 99%    Visual Acuity Right Eye Distance:   Left Eye Distance:   Bilateral Distance:    Right Eye Near:   Left Eye Near:    Bilateral Near:     Physical Exam  Constitutional: She is oriented to person, place, and time. She appears well-developed and well-nourished.  No acute distress  HENT:  Head: Normocephalic and atraumatic.  Nose: Nose normal.  Eyes: Conjunctivae are normal.  Neck: Neck supple.  Cardiovascular: Normal rate.  Pulmonary/Chest: Effort normal. No respiratory distress.  Abdominal: She exhibits no distension.  Genitourinary:  Genitourinary Comments: Patient with slight erythema, swelling and yellowish ulcerative lesion to clitoral hood, clitoris appears enlarged  Musculoskeletal: Normal range of motion.  Neurological: She is alert and oriented to person, place, and time.  Skin: Skin is warm and dry.  Psychiatric: She has a normal mood and affect.  Nursing note and vitals reviewed.    UC Treatments / Results  Labs (all labs ordered are listed, but only abnormal results are displayed) Labs Reviewed  HSV CULTURE AND TYPING    EKG None  Radiology No results found.  Procedures Procedures (including critical care time)  Medications Ordered in UC Medications - No data to display  Initial Impression / Assessment and Plan / UC Course  I have reviewed the triage vital signs and the nursing notes.  Pertinent labs & imaging results that were available during my care of the patient were reviewed by me and considered in my medical decision making (see chart for details).     Patient is with lesion to her labia minora/clitoris, swab for HSV, will provide patient with Bactroban to apply for possible abscess versus other infectious lesion.  Ice and anti-inflammatories for swelling, avoid further irritation to this area.  Keep clean and dry.  Continue to monitor, follow-up if symptoms not improving or worsening.  Declined concern for STDs, vaginal swab deferred.   Discussed strict return precautions. Patient verbalized understanding and is agreeable with plan.  Final Clinical Impressions(s) / UC Diagnoses   Final diagnoses:  Inflammation of clitoris     Discharge Instructions     Please apply Bactroban cream twice daily to this area to help apply barrier and help with any infection May use ice to help with swelling Tylenol and ibuprofen for pain Please follow-up if symptoms worsening, changing, not improving, developing vaginal discharge, vaginal irritation, pelvic pain.   ED Prescriptions    Medication Sig Dispense Auth. Provider   mupirocin ointment (BACTROBAN) 2 % Apply 1 application topically 2 (two) times daily. 22 g Jariyah Hackley, [place] C, PA-C     Controlled  Substance Prescriptions [place] Controlled Substance Registry consulted? Not Applicable   Lew Dawes, [place] 02/19/18 1901

## 2018-02-19 NOTE — ED Triage Notes (Signed)
Patient has a bump in vaginal area.  Noticed 3 days ago.  Knot is painful to touch.

## 2018-02-19 NOTE — Discharge Instructions (Signed)
Please apply Bactroban cream twice daily to this area to help apply barrier and help with any infection May use ice to help with swelling Tylenol and ibuprofen for pain Please follow-up if symptoms worsening, changing, not improving, developing vaginal discharge, vaginal irritation, pelvic pain.

## 2018-02-23 ENCOUNTER — Telehealth (HOSPITAL_COMMUNITY): Payer: Self-pay

## 2018-02-23 LAB — HSV CULTURE AND TYPING

## 2018-02-23 NOTE — Telephone Encounter (Signed)
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 Educated on Herpes and safe sex practices. Pt denies any concerns and verbalized understanding.

## 2018-02-25 ENCOUNTER — Telehealth (HOSPITAL_COMMUNITY): Payer: Self-pay

## 2018-02-25 MED ORDER — ACYCLOVIR 400 MG PO TABS: 400.0000 mg | ORAL_TABLET | Freq: Three times a day (TID) | ORAL | 0 refills | Status: AC

## 2018-02-25 NOTE — Telephone Encounter (Signed)
Pt called this RN and stated that she was confused by our previous conversation. Stated that she does have active lesions. Rx for Acyclovir called into pharmacy of choice. Safe sex education provided and answered all questions.

## 2018-03-28 ENCOUNTER — Encounter (HOSPITAL_COMMUNITY): Payer: Self-pay

## 2018-03-28 ENCOUNTER — Other Ambulatory Visit: Payer: Self-pay

## 2018-03-28 ENCOUNTER — Ambulatory Visit (HOSPITAL_COMMUNITY)
Admission: EM | Admit: 2018-03-28 | Discharge: 2018-03-28 | Disposition: A | Discharge status: Home / Self Care | Payer: Self-pay | Attending: Family Medicine | Admitting: Family Medicine

## 2018-03-28 DIAGNOSIS — Z881 Allergy status to other antibiotic agents status: Secondary | ICD-10-CM | POA: Insufficient documentation

## 2018-03-28 DIAGNOSIS — Z3202 Encounter for pregnancy test, result negative: Secondary | ICD-10-CM

## 2018-03-28 DIAGNOSIS — N898 Other specified noninflammatory disorders of vagina: Secondary | ICD-10-CM | POA: Insufficient documentation

## 2018-03-28 DIAGNOSIS — F17210 Nicotine dependence, cigarettes, uncomplicated: Secondary | ICD-10-CM | POA: Insufficient documentation

## 2018-03-28 DIAGNOSIS — Z79899 Other long term (current) drug therapy: Secondary | ICD-10-CM | POA: Insufficient documentation

## 2018-03-28 DIAGNOSIS — I10 Essential (primary) hypertension: Secondary | ICD-10-CM | POA: Insufficient documentation

## 2018-03-28 DIAGNOSIS — Z8249 Family history of ischemic heart disease and other diseases of the circulatory system: Secondary | ICD-10-CM | POA: Insufficient documentation

## 2018-03-28 DIAGNOSIS — F329 Major depressive disorder, single episode, unspecified: Secondary | ICD-10-CM | POA: Insufficient documentation

## 2018-03-28 DIAGNOSIS — J45909 Unspecified asthma, uncomplicated: Secondary | ICD-10-CM | POA: Insufficient documentation

## 2018-03-28 LAB — POCT URINALYSIS DIP (DEVICE)
BILIRUBIN URINE: NEGATIVE
GLUCOSE, UA: NEGATIVE mg/dL
HGB URINE DIPSTICK: NEGATIVE
Ketones, ur: NEGATIVE mg/dL
LEUKOCYTES UA: NEGATIVE
Nitrite: NEGATIVE
Protein, ur: NEGATIVE mg/dL
Specific Gravity, Urine: >=1.03 (ref 1.005–1.030)
Urobilinogen, UA: 0.2 mg/dL (ref 0.0–1.0)
pH: 6 (ref 5.0–8.0)

## 2018-03-28 LAB — POCT PREGNANCY, URINE: PREG TEST UR: NEGATIVE

## 2018-03-28 MED ORDER — FLUCONAZOLE 150 MG PO TABS: 150.0000 mg | ORAL_TABLET | Freq: Once | ORAL | 0 refills | Status: AC

## 2018-03-28 NOTE — Discharge Instructions (Signed)
Please take Diflucan, 1 tablet today, may repeat in 2 to 3 days if still having symptoms  We are testing you for Gonorrhea, Chlamydia, Trichomonas, Yeast and Bacterial Vaginosis. We will call you if anything is positive and let you know if you require any further treatment. Please inform partners of any positive results.   Please return if symptoms not improving with treatment, development of fever, nausea, vomiting, abdominal pain.

## 2018-03-28 NOTE — ED Triage Notes (Signed)
Pt cc vaginal irritation.  X 2 days.

## 2018-03-28 NOTE — ED Provider Notes (Signed)
[facility]    CSN: [id number] Arrival date & time: 03/28/18  1444     History   Chief Complaint Chief Complaint  Patient presents with  . vaginal irritation    HPI Sara Taylor is a 27 y.o. female history of asthma, tobacco use, hypertension presenting today for evaluation of vaginal irritation.  Patient states that over the past few days she has had increased vaginal itching.  She has had associated discharge.  Feels symptoms similar to yeast, but is concerned about STDs as well.  She has noticed an odor with urination.  Denies dysuria or increased frequency.  Last menstrual period was around the 11th or 12 October.  Denies abdominal pain, fever, nausea or vomiting.  HPI  Past Medical History:  Diagnosis Date  . Asthma   . Depression   . Hypertension   . Ovarian cyst     Patient Active Problem List   Diagnosis Date Noted  . Asthma 01/03/2016  . URI (upper respiratory infection) 11/17/2015  . Vaginal discharge 03/02/2014  . Preventative health care 06/02/2013  . Screening for STD (sexually transmitted disease) 06/02/2013  . Encounter for long-term (current) use of medications 05/26/2012  . History of chlamydia 05/26/2012  . TOBACCO USE 06/09/2008  . OBESITY, NOS 07/10/2006  . HYPERTENSION, BENIGN SYSTEMIC 07/10/2006  . RHINITIS, ALLERGIC 07/10/2006    History reviewed. No pertinent surgical history.  OB History   None      Home Medications    Prior to Admission medications   Medication Sig Start Date End Date Taking? Authorizing Provider  fluconazole (DIFLUCAN) 150 MG tablet Take 1 tablet (150 mg total) by mouth once for 1 dose. 03/28/18 03/28/18  Berneda Piccininni C, PA-C  hydrochlorothiazide (HYDRODIURIL) 25 MG tablet Take 1 tablet (25 mg total) by mouth daily. Must have office visit for refills Patient taking differently: Take 25 mg by mouth daily at 12 noon. Must have office visit for refills 01/03/16   Delynn Flavin M, DO  mupirocin  ointment (BACTROBAN) 2 % Apply 1 application topically 2 (two) times daily. 02/19/18   Jaceion Aday, Junius Creamer, PA-C    Family History Family History  Problem Relation Age of Onset  . Hypertension Maternal Grandmother   . Hypertension Maternal Grandfather   . Hypertension Paternal Grandmother   . Diabetes Paternal Grandmother   . Hypertension Mother   . Diabetes Mother   . Hypothyroidism Mother     Social History Social History   Tobacco Use  . Smoking status: Current Every Day Smoker    Packs/day: 0.20    Types: Cigarettes    Start date: 08/08/2015    Last attempt to quit: 09/2015    Years since quitting: 2.5  . Smokeless tobacco: Former Neurosurgeon    Quit date: 08/08/2015  Substance Use Topics  . Alcohol use: Yes    Alcohol/week: 0.0 standard drinks    Comment: occ  . Drug use: No     Allergies   Doxycycline   Review of Systems Review of Systems  Constitutional: Negative for fever.  Respiratory: Negative for shortness of breath.   Cardiovascular: Negative for chest pain.  Gastrointestinal: Negative for abdominal pain, diarrhea, nausea and vomiting.  Genitourinary: Positive for vaginal discharge. Negative for dysuria, enuresis, flank pain, frequency, genital sores, hematuria, menstrual problem, vaginal bleeding and vaginal pain.  Musculoskeletal: Negative for back pain.  Skin: Negative for rash.  Neurological: Negative for dizziness, light-headedness and headaches.     Physical Exam Triage Vital  Signs ED Triage Vitals  Enc Vitals Group     BP 03/28/18 1504 (!) 152/81     Pulse Rate 03/28/18 1504 82     Resp 03/28/18 1504 20     Temp 03/28/18 1504 98.2 F (36.8 C)     Temp Source 03/28/18 1504 Oral     SpO2 03/28/18 1504 100 %     Weight 03/28/18 1502 (!) 349 lb (158.3 kg)     Height --      Head Circumference --      Peak Flow --      Pain Score 03/28/18 1502 10     Pain Loc --      Pain Edu? --      Excl. in GC? --    No data found.  Updated Vital  Signs BP (!) 152/81 (BP Location: Right Arm)   Pulse 82   Temp 98.2 F (36.8 C) (Oral)   Resp 20   Wt (!) 349 lb (158.3 kg)   SpO2 100%   BMI 58.08 kg/m   Visual Acuity Right Eye Distance:   Left Eye Distance:   Bilateral Distance:    Right Eye Near:   Left Eye Near:    Bilateral Near:     Physical Exam  Constitutional: She is oriented to person, place, and time. She appears well-developed and well-nourished.  No acute distress  HENT:  Head: Normocephalic and atraumatic.  Nose: Nose normal.  Eyes: Conjunctivae are normal.  Neck: Neck supple.  Cardiovascular: Normal rate.  Pulmonary/Chest: Effort normal. No respiratory distress.  Abdominal: She exhibits no distension.  Genitourinary:  Genitourinary Comments: Normal external female genitalia, multiple small folliculitis to pubic area, vaginal orifice with thick white discharge present, moderate amount of thick white discharge in vagina  Musculoskeletal: Normal range of motion.  Neurological: She is alert and oriented to person, place, and time.  Skin: Skin is warm and dry.  Psychiatric: She has a normal mood and affect.  Nursing note and vitals reviewed.    UC Treatments / Results  Labs (all labs ordered are listed, but only abnormal results are displayed) Labs Reviewed  POCT URINALYSIS DIP (DEVICE)  POCT PREGNANCY, URINE  CERVICOVAGINAL ANCILLARY ONLY    EKG None  Radiology No results found.  Procedures Procedures (including critical care time)  Medications Ordered in UC Medications - No data to display  Initial Impression / Assessment and Plan / UC Course  I have reviewed the triage vital signs and the nursing notes.  Pertinent labs & imaging results that were available during my care of the patient were reviewed by me and considered in my medical decision making (see chart for details).    Will treat for yeast today given appearance of discharge.  Will send swab off to check for STDs as well as to  confirm.  Advised patient to apply warm compresses to areas of hair bumps.  Continue to monitor symptoms and follow-up if symptoms not resolving.Discussed strict return precautions. Patient verbalized understanding and is agreeable with plan.  Final Clinical Impressions(s) / UC Diagnoses   Final diagnoses:  Vaginal discharge     Discharge Instructions     Please take Diflucan, 1 tablet today, may repeat in 2 to 3 days if still having symptoms  We are testing you for Gonorrhea, Chlamydia, Trichomonas, Yeast and Bacterial Vaginosis. We will call you if anything is positive and let you know if you require any further treatment. Please inform partners of any positive  results.   Please return if symptoms not improving with treatment, development of fever, nausea, vomiting, abdominal pain.     ED Prescriptions    Medication Sig Dispense Auth. Provider   fluconazole (DIFLUCAN) 150 MG tablet Take 1 tablet (150 mg total) by mouth once for 1 dose. 2 tablet Genavive Kubicki C, PA-C     Controlled Substance Prescriptions [place] Controlled Substance Registry consulted? Not Applicable   Lew Dawes, [place] 03/28/18 1541

## 2018-03-30 LAB — CERVICOVAGINAL ANCILLARY ONLY
Bacterial vaginitis: POSITIVE — AB
Candida vaginitis: POSITIVE — AB
Chlamydia: NEGATIVE
Neisseria Gonorrhea: NEGATIVE
Trichomonas: NEGATIVE

## 2018-03-31 ENCOUNTER — Telehealth (HOSPITAL_COMMUNITY): Payer: Self-pay

## 2018-03-31 MED ORDER — METRONIDAZOLE 500 MG PO TABS: 500.0000 mg | ORAL_TABLET | Freq: Two times a day (BID) | ORAL | 0 refills | Status: DC

## 2018-03-31 NOTE — Telephone Encounter (Signed)
Bacterial vaginosis is positive. This was not treated at the urgent care visit.  Flagyl 500 mg BID x 7 days #14 no refills sent to patients pharmacy of choice.    Candida (yeast) is positive.  Prescription for fluconazole was given at the urgent care visit.    Patient contacted, denies any concerns. Answered all questions

## 2018-08-06 ENCOUNTER — Encounter (HOSPITAL_COMMUNITY): Payer: Self-pay | Admitting: Emergency Medicine

## 2018-08-06 ENCOUNTER — Ambulatory Visit (HOSPITAL_COMMUNITY)
Admission: EM | Admit: 2018-08-06 | Discharge: 2018-08-06 | Disposition: A | Discharge status: Home / Self Care | Payer: Self-pay | Attending: Family Medicine | Admitting: Family Medicine

## 2018-08-06 DIAGNOSIS — N760 Acute vaginitis: Secondary | ICD-10-CM | POA: Insufficient documentation

## 2018-08-06 DIAGNOSIS — Z113 Encounter for screening for infections with a predominantly sexual mode of transmission: Secondary | ICD-10-CM | POA: Insufficient documentation

## 2018-08-06 MED ORDER — METRONIDAZOLE 500 MG PO TABS: 500.0000 mg | ORAL_TABLET | Freq: Two times a day (BID) | ORAL | 0 refills | Status: DC

## 2018-08-06 MED ORDER — FLUCONAZOLE 150 MG PO TABS: 150.0000 mg | ORAL_TABLET | Freq: Every day | ORAL | 0 refills | Status: DC

## 2018-08-06 NOTE — ED Triage Notes (Signed)
Pt c/o vaginal itching x 3 days.    

## 2018-08-06 NOTE — Discharge Instructions (Addendum)
We will go ahead and treat you for a bacterial and yeast infection Diflucan for yeast and flagyl for bacteria  Sending swab for STD testing. We will call with any positive results

## 2018-08-07 LAB — CERVICOVAGINAL ANCILLARY ONLY
CHLAMYDIA, DNA PROBE: NEGATIVE
Neisseria Gonorrhea: NEGATIVE
Trichomonas: NEGATIVE

## 2018-08-09 NOTE — ED Provider Notes (Signed)
[facility]    CSN: [id number] Arrival date & time: 08/06/18  1437     History   Chief Complaint Chief Complaint  Patient presents with  . SEXUALLY TRANSMITTED DISEASE  . Vaginal Itching    HPI Sara Taylor is a 28 y.o. female.   Patient is a 28 year old female that presents today with vaginal itching, discharge and irritation for approximate 3 days.  Symptoms have been constant.  She has not done anything to treat the symptoms.  She is currently sexually active and like to be tested for STDs.  Denies any dysuria, hematuria, urinary frequency.  Denies any abdominal pain, back pain, flank pain, fevers, chills.  ROS per HPI      Past Medical History:  Diagnosis Date  . Asthma   . Depression   . Hypertension   . Ovarian cyst     Patient Active Problem List   Diagnosis Date Noted  . Asthma 01/03/2016  . URI (upper respiratory infection) 11/17/2015  . Vaginal discharge 03/02/2014  . Preventative health care 06/02/2013  . Screening for STD (sexually transmitted disease) 06/02/2013  . Encounter for long-term (current) use of medications 05/26/2012  . History of chlamydia 05/26/2012  . TOBACCO USE 06/09/2008  . OBESITY, NOS 07/10/2006  . HYPERTENSION, BENIGN SYSTEMIC 07/10/2006  . RHINITIS, ALLERGIC 07/10/2006    History reviewed. No pertinent surgical history.  OB History   No obstetric history on file.      Home Medications    Prior to Admission medications   Medication Sig Start Date End Date Taking? Authorizing Provider  fluconazole (DIFLUCAN) 150 MG tablet Take 1 tablet (150 mg total) by mouth daily. 08/06/18   Dahlia Byes A, NP  hydrochlorothiazide (HYDRODIURIL) 25 MG tablet Take 1 tablet (25 mg total) by mouth daily. Must have office visit for refills Patient taking differently: Take 25 mg by mouth daily at 12 noon. Must have office visit for refills 01/03/16   Delynn Flavin M, DO  metroNIDAZOLE (FLAGYL) 500 MG tablet Take 1 tablet  (500 mg total) by mouth 2 (two) times daily. 08/06/18   Dahlia Byes A, NP  mupirocin ointment (BACTROBAN) 2 % Apply 1 application topically 2 (two) times daily. Patient not taking: Reported on 08/06/2018 02/19/18   Lew Dawes, PA-C    Family History Family History  Problem Relation Age of Onset  . Hypertension Maternal Grandmother   . Hypertension Maternal Grandfather   . Hypertension Paternal Grandmother   . Diabetes Paternal Grandmother   . Hypertension Mother   . Diabetes Mother   . Hypothyroidism Mother     Social History Social History   Tobacco Use  . Smoking status: Current Every Day Smoker    Packs/day: 0.20    Types: Cigarettes    Start date: 08/08/2015    Last attempt to quit: 09/2015    Years since quitting: 2.9  . Smokeless tobacco: Former Neurosurgeon    Quit date: 08/08/2015  Substance Use Topics  . Alcohol use: Yes    Alcohol/week: 0.0 standard drinks    Comment: occ  . Drug use: No     Allergies   Doxycycline   Review of Systems Review of Systems   Physical Exam Triage Vital Signs ED Triage Vitals [08/06/18 1503]  Enc Vitals Group     BP (!) 164/90     Pulse Rate 93     Resp 16     Temp 98.5 F (36.9 C)     Temp  src      SpO2 100 %     Weight      Height      Head Circumference      Peak Flow      Pain Score 0     Pain Loc      Pain Edu?      Excl. in GC?    No data found.  Updated Vital Signs BP (!) 164/90   Pulse 93   Temp 98.5 F (36.9 C)   Resp 16   LMP 07/30/2018   SpO2 100%   Visual Acuity Right Eye Distance:   Left Eye Distance:   Bilateral Distance:    Right Eye Near:   Left Eye Near:    Bilateral Near:     Physical Exam Vitals signs and nursing note reviewed.  Constitutional:      General: She is not in acute distress.    Appearance: Normal appearance. She is not ill-appearing, toxic-appearing or diaphoretic.  HENT:     Head: Normocephalic.     Nose: Nose normal.     Mouth/Throat:     Pharynx: Oropharynx  is clear.  Eyes:     Conjunctiva/sclera: Conjunctivae normal.  Neck:     Musculoskeletal: Normal range of motion.  Pulmonary:     Effort: Pulmonary effort is normal.  Abdominal:     Palpations: Abdomen is soft.     Tenderness: There is no abdominal tenderness.  Musculoskeletal: Normal range of motion.  Skin:    General: Skin is warm and dry.     Findings: No rash.  Neurological:     Mental Status: She is alert.  Psychiatric:        Mood and Affect: Mood normal.      UC Treatments / Results  Labs (all labs ordered are listed, but only abnormal results are displayed) Labs Reviewed  CERVICOVAGINAL ANCILLARY ONLY    EKG None  Radiology No results found.  Procedures Procedures (including critical care time)  Medications Ordered in UC Medications - No data to display  Initial Impression / Assessment and Plan / UC Course  I have reviewed the triage vital signs and the nursing notes.  Pertinent labs & imaging results that were available during my care of the patient were reviewed by me and considered in my medical decision making (see chart for details).     Vaginitis  Based on history and symptoms we will go ahead and treat for bacterial infection and yeast infection. Screening for STDs.  Lab results pending Final Clinical Impressions(s) / UC Diagnoses   Final diagnoses:  Vaginitis and vulvovaginitis  Screening for STDs (sexually transmitted diseases)     Discharge Instructions     We will go ahead and treat you for a bacterial and yeast infection Diflucan for yeast and flagyl for bacteria  Sending swab for STD testing. We will call with any positive results    ED Prescriptions    Medication Sig Dispense Auth. Provider   fluconazole (DIFLUCAN) 150 MG tablet Take 1 tablet (150 mg total) by mouth daily. 2 tablet Matheau Orona A, NP   metroNIDAZOLE (FLAGYL) 500 MG tablet Take 1 tablet (500 mg total) by mouth 2 (two) times daily. 14 tablet Janace Aris, NP      Controlled Substance Prescriptions [place] Controlled Substance Registry consulted? no   Janace Aris, NP 08/09/18 1007

## 2018-10-26 ENCOUNTER — Emergency Department (HOSPITAL_COMMUNITY): Payer: Self-pay

## 2018-10-26 ENCOUNTER — Encounter (HOSPITAL_COMMUNITY): Payer: Self-pay | Admitting: Emergency Medicine

## 2018-10-26 ENCOUNTER — Emergency Department (HOSPITAL_COMMUNITY)
Admission: EM | Admit: 2018-10-26 | Discharge: 2018-10-26 | Disposition: A | Discharge status: Home / Self Care | Payer: Self-pay | Attending: Emergency Medicine | Admitting: Emergency Medicine

## 2018-10-26 DIAGNOSIS — M25551 Pain in right hip: Secondary | ICD-10-CM | POA: Insufficient documentation

## 2018-10-26 DIAGNOSIS — S0101XA Laceration without foreign body of scalp, initial encounter: Secondary | ICD-10-CM | POA: Insufficient documentation

## 2018-10-26 DIAGNOSIS — I10 Essential (primary) hypertension: Secondary | ICD-10-CM | POA: Insufficient documentation

## 2018-10-26 DIAGNOSIS — F17210 Nicotine dependence, cigarettes, uncomplicated: Secondary | ICD-10-CM | POA: Insufficient documentation

## 2018-10-26 DIAGNOSIS — R071 Chest pain on breathing: Secondary | ICD-10-CM | POA: Insufficient documentation

## 2018-10-26 DIAGNOSIS — M25532 Pain in left wrist: Secondary | ICD-10-CM | POA: Insufficient documentation

## 2018-10-26 DIAGNOSIS — Y92410 Unspecified street and highway as the place of occurrence of the external cause: Secondary | ICD-10-CM | POA: Insufficient documentation

## 2018-10-26 DIAGNOSIS — S01511A Laceration without foreign body of lip, initial encounter: Secondary | ICD-10-CM | POA: Insufficient documentation

## 2018-10-26 DIAGNOSIS — Y9389 Activity, other specified: Secondary | ICD-10-CM | POA: Insufficient documentation

## 2018-10-26 DIAGNOSIS — Z79899 Other long term (current) drug therapy: Secondary | ICD-10-CM | POA: Insufficient documentation

## 2018-10-26 DIAGNOSIS — Y999 Unspecified external cause status: Secondary | ICD-10-CM | POA: Insufficient documentation

## 2018-10-26 DIAGNOSIS — J45909 Unspecified asthma, uncomplicated: Secondary | ICD-10-CM | POA: Insufficient documentation

## 2018-10-26 HISTORY — DX: Obesity, unspecified: E66.9

## 2018-10-26 LAB — CBC WITH DIFFERENTIAL/PLATELET
Abs Immature Granulocytes: 0.03 10*3/uL (ref 0.00–0.07)
Basophils Absolute: 0 10*3/uL (ref 0.0–0.1)
Basophils Relative: 0 %
Eosinophils Absolute: 0 10*3/uL (ref 0.0–0.5)
Eosinophils Relative: 0 %
HCT: 40.1 % (ref 36.0–46.0)
Hemoglobin: 13.4 g/dL (ref 12.0–15.0)
Immature Granulocytes: 0 %
Lymphocytes Relative: 17 %
Lymphs Abs: 2.1 10*3/uL (ref 0.7–4.0)
MCH: 31.3 pg (ref 26.0–34.0)
MCHC: 33.4 g/dL (ref 30.0–36.0)
MCV: 93.7 fL (ref 80.0–100.0)
Monocytes Absolute: 0.7 10*3/uL (ref 0.1–1.0)
Monocytes Relative: 5 %
Neutro Abs: 9.8 10*3/uL — ABNORMAL HIGH (ref 1.7–7.7)
Neutrophils Relative %: 78 %
Platelets: 217 10*3/uL (ref 150–400)
RBC: 4.28 MIL/uL (ref 3.87–5.11)
RDW: 13.4 % (ref 11.5–15.5)
WBC: 12.6 10*3/uL — ABNORMAL HIGH (ref 4.0–10.5)
nRBC: 0 % (ref 0.0–0.2)

## 2018-10-26 LAB — COMPREHENSIVE METABOLIC PANEL
ALT: 30 U/L (ref 0–44)
AST: 44 U/L — ABNORMAL HIGH (ref 15–41)
Albumin: 4.2 g/dL (ref 3.5–5.0)
Alkaline Phosphatase: 64 U/L (ref 38–126)
Anion gap: 11 (ref 5–15)
BUN: 16 mg/dL (ref 6–20)
CO2: 25 mmol/L (ref 22–32)
Calcium: 9.2 mg/dL (ref 8.9–10.3)
Chloride: 102 mmol/L (ref 98–111)
Creatinine, Ser: 1.24 mg/dL — ABNORMAL HIGH (ref 0.44–1.00)
GFR calc Af Amer: >60 mL/min (ref >60)
GFR calc non Af Amer: 59 mL/min — ABNORMAL LOW (ref >60)
Glucose, Bld: 133 mg/dL — ABNORMAL HIGH (ref 70–99)
Potassium: 3.3 mmol/L — ABNORMAL LOW (ref 3.5–5.1)
Sodium: 138 mmol/L (ref 135–145)
Total Bilirubin: 0.6 mg/dL (ref 0.3–1.2)
Total Protein: 8.1 g/dL (ref 6.5–8.1)

## 2018-10-26 LAB — I-STAT BETA HCG BLOOD, ED (MC, WL, AP ONLY): I-stat hCG, quantitative: <5 m[IU]/mL (ref <5)

## 2018-10-26 LAB — I-STAT CREATININE, ED: Creatinine, Ser: 1.2 mg/dL — ABNORMAL HIGH (ref 0.44–1.00)

## 2018-10-26 MED ORDER — MORPHINE SULFATE (PF) 4 MG/ML IV SOLN
4.0000 mg | Freq: Once | INTRAVENOUS | Status: AC
  Administered 2018-10-26: 09:00:00 4 mg via INTRAVENOUS
  Filled 2018-10-26: qty 1

## 2018-10-26 MED ORDER — IOHEXOL 300 MG/ML  SOLN
100.0000 mL | Freq: Once | INTRAMUSCULAR | Status: AC
  Administered 2018-10-26: 100 mL via INTRAVENOUS

## 2018-10-26 MED ORDER — METHOCARBAMOL 500 MG PO TABS: 500.0000 mg | ORAL_TABLET | Freq: Two times a day (BID) | ORAL | 0 refills | Status: DC | PRN

## 2018-10-26 MED ORDER — NAPROXEN 500 MG PO TABS: 500.0000 mg | ORAL_TABLET | Freq: Two times a day (BID) | ORAL | 0 refills | Status: DC

## 2018-10-26 NOTE — ED Notes (Signed)
Patient verbalizes understanding of discharge instructions. Opportunity for questioning and answers were provided. Armband removed by staff, pt discharged from ED home via [place] with friends.

## 2018-10-26 NOTE — Discharge Instructions (Signed)
Take naproxen 2 times a day with meals.  Do not take other anti-inflammatories at the same time (Advil, Motrin, ibuprofen, Aleve). You may supplement with Tylenol if you need further pain control. Use robaxin as needed for muscle stiffness or soreness.  Have caution, this may make you tired or groggy.  Do not drive or operate heavy machinery while taking this medicine. Use ice packs or heating pads if this helps control your pain. Wash the cut on your head daily with soap and water.  Try not to irritate or touch the cut on your lip.  You will likely have continued muscle stiffness and soreness over the next couple days.  Follow-up with primary care in 1 week if your symptoms are not improving. Return to the emergency room if you develop vision changes, vomiting, slurred speech, numbness, loss of bowel or bladder control, or any new or worsening symptoms.
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
## 2018-10-29 ENCOUNTER — Encounter (HOSPITAL_COMMUNITY): Payer: Self-pay | Admitting: Emergency Medicine

## 2018-10-29 ENCOUNTER — Other Ambulatory Visit: Payer: Self-pay

## 2018-10-29 ENCOUNTER — Emergency Department (HOSPITAL_COMMUNITY)
Admission: EM | Admit: 2018-10-29 | Discharge: 2018-10-29 | Disposition: A | Discharge status: Home / Self Care | Payer: Self-pay | Attending: Emergency Medicine | Admitting: Emergency Medicine

## 2018-10-29 ENCOUNTER — Emergency Department (HOSPITAL_COMMUNITY): Payer: Self-pay

## 2018-10-29 DIAGNOSIS — F17210 Nicotine dependence, cigarettes, uncomplicated: Secondary | ICD-10-CM | POA: Insufficient documentation

## 2018-10-29 DIAGNOSIS — J45909 Unspecified asthma, uncomplicated: Secondary | ICD-10-CM | POA: Insufficient documentation

## 2018-10-29 DIAGNOSIS — I10 Essential (primary) hypertension: Secondary | ICD-10-CM | POA: Insufficient documentation

## 2018-10-29 DIAGNOSIS — J020 Streptococcal pharyngitis: Secondary | ICD-10-CM | POA: Insufficient documentation

## 2018-10-29 DIAGNOSIS — Z79899 Other long term (current) drug therapy: Secondary | ICD-10-CM | POA: Insufficient documentation

## 2018-10-29 LAB — COMPREHENSIVE METABOLIC PANEL
ALT: 29 U/L (ref 0–44)
AST: 25 U/L (ref 15–41)
Albumin: 3.2 g/dL — ABNORMAL LOW (ref 3.5–5.0)
Alkaline Phosphatase: 76 U/L (ref 38–126)
Anion gap: 7 (ref 5–15)
BUN: 5 mg/dL — ABNORMAL LOW (ref 6–20)
CO2: 24 mmol/L (ref 22–32)
Calcium: 8.7 mg/dL — ABNORMAL LOW (ref 8.9–10.3)
Chloride: 101 mmol/L (ref 98–111)
Creatinine, Ser: 0.81 mg/dL (ref 0.44–1.00)
GFR calc Af Amer: >60 mL/min (ref >60)
GFR calc non Af Amer: >60 mL/min (ref >60)
Glucose, Bld: 104 mg/dL — ABNORMAL HIGH (ref 70–99)
Potassium: 3.5 mmol/L (ref 3.5–5.1)
Sodium: 132 mmol/L — ABNORMAL LOW (ref 135–145)
Total Bilirubin: 1 mg/dL (ref 0.3–1.2)
Total Protein: 7.3 g/dL (ref 6.5–8.1)

## 2018-10-29 LAB — CBC WITH DIFFERENTIAL/PLATELET
Abs Immature Granulocytes: 0.05 10*3/uL (ref 0.00–0.07)
Basophils Absolute: 0 10*3/uL (ref 0.0–0.1)
Basophils Relative: 0 %
Eosinophils Absolute: 0 10*3/uL (ref 0.0–0.5)
Eosinophils Relative: 0 %
HCT: 35.2 % — ABNORMAL LOW (ref 36.0–46.0)
Hemoglobin: 11.7 g/dL — ABNORMAL LOW (ref 12.0–15.0)
Immature Granulocytes: 1 %
Lymphocytes Relative: 11 %
Lymphs Abs: 1 10*3/uL (ref 0.7–4.0)
MCH: 30.6 pg (ref 26.0–34.0)
MCHC: 33.2 g/dL (ref 30.0–36.0)
MCV: 92.1 fL (ref 80.0–100.0)
Monocytes Absolute: 0.7 10*3/uL (ref 0.1–1.0)
Monocytes Relative: 8 %
Neutro Abs: 7.2 10*3/uL (ref 1.7–7.7)
Neutrophils Relative %: 80 %
Platelets: 155 10*3/uL (ref 150–400)
RBC: 3.82 MIL/uL — ABNORMAL LOW (ref 3.87–5.11)
RDW: 13 % (ref 11.5–15.5)
WBC: 9.1 10*3/uL (ref 4.0–10.5)
nRBC: 0 % (ref 0.0–0.2)

## 2018-10-29 LAB — GROUP A STREP BY PCR: Group A Strep by PCR: NOT DETECTED

## 2018-10-29 MED ORDER — SODIUM CHLORIDE 0.9 % IV BOLUS
1000.0000 mL | Freq: Once | INTRAVENOUS | Status: AC
  Administered 2018-10-29: 13:00:00 1000 mL via INTRAVENOUS

## 2018-10-29 MED ORDER — CLINDAMYCIN HCL 150 MG PO CAPS: 300.0000 mg | ORAL_CAPSULE | Freq: Four times a day (QID) | ORAL | 0 refills | Status: AC

## 2018-10-29 MED ORDER — CLINDAMYCIN PHOSPHATE 900 MG/50ML IV SOLN
900.0000 mg | Freq: Once | INTRAVENOUS | Status: AC
  Administered 2018-10-29: 13:00:00 900 mg via INTRAVENOUS
  Filled 2018-10-29: qty 50

## 2018-10-29 MED ORDER — DEXAMETHASONE SODIUM PHOSPHATE 10 MG/ML IJ SOLN
10.0000 mg | Freq: Once | INTRAMUSCULAR | Status: AC
  Administered 2018-10-29: 13:00:00 10 mg via INTRAVENOUS
  Filled 2018-10-29: qty 1

## 2018-10-29 MED ORDER — IOPAMIDOL (ISOVUE-300) INJECTION 61%
75.0000 mL | Freq: Once | INTRAVENOUS | Status: AC | PRN
  Administered 2018-10-29: 75 mL via INTRAVENOUS

## 2018-10-29 MED ORDER — MORPHINE SULFATE (PF) 4 MG/ML IV SOLN
4.0000 mg | Freq: Once | INTRAVENOUS | Status: AC
  Administered 2018-10-29: 13:00:00 4 mg via INTRAVENOUS
  Filled 2018-10-29: qty 1

## 2018-10-29 NOTE — ED Notes (Signed)
Patient verbalizes understanding of discharge instructions. Opportunity for questioning and answers were provided. Armband removed by staff, pt discharged from ED. Ambulated out to lobby  
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
## 2018-10-29 NOTE — Discharge Instructions (Addendum)
Tylenol/Ibuprofen for pain.   Do not share drinks/food.   Follow up with PCP later this week.  Return to ED sooner if needed.

## 2018-10-29 NOTE — ED Notes (Signed)
Strep swab and lab at bedside
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
## 2019-07-02 ENCOUNTER — Encounter (HOSPITAL_COMMUNITY): Payer: Self-pay

## 2019-07-02 ENCOUNTER — Other Ambulatory Visit: Payer: Self-pay

## 2019-07-02 ENCOUNTER — Ambulatory Visit (HOSPITAL_COMMUNITY)
Admission: EM | Admit: 2019-07-02 | Discharge: 2019-07-02 | Disposition: A | Discharge status: Home / Self Care | Payer: Self-pay | Attending: Family Medicine | Admitting: Family Medicine

## 2019-07-02 DIAGNOSIS — L720 Epidermal cyst: Secondary | ICD-10-CM

## 2019-07-02 DIAGNOSIS — M542 Cervicalgia: Secondary | ICD-10-CM

## 2019-07-02 MED ORDER — NAPROXEN 500 MG PO TABS: 500.0000 mg | ORAL_TABLET | Freq: Two times a day (BID) | ORAL | 0 refills | Status: DC

## 2019-07-02 MED ORDER — CEPHALEXIN 500 MG PO CAPS: 500.0000 mg | ORAL_CAPSULE | Freq: Three times a day (TID) | ORAL | 0 refills | Status: DC

## 2019-07-02 NOTE — Discharge Instructions (Signed)
Please change your dressing 2-3 times daily. Do not apply any ointments or creams. Each time you change your dressing, make sure you clean gently around the perimeter of the wound with gentle soap and warm water. Make sure you use non-stick dressings which you can pick up at your pharmacy and secure it with medical tape (also can be obtained at your pharmacy). Follow up with the dermatologist at the [facility].

## 2019-07-02 NOTE — ED Provider Notes (Signed)
[facility]   MRN: [id number] DOB: 06/04/1990  Subjective:   Sara Taylor is a 29 y.o. female presenting for 6 month hx of neck swelling, discomfort. Patient has used ibuprofen for pain but is concerned about an abscess.   No current facility-administered medications for this encounter.  Current Outpatient Medications:  .  fluconazole (DIFLUCAN) 150 MG tablet, Take 1 tablet (150 mg total) by mouth daily. (Patient not taking: Reported on 10/26/2018), Disp: 2 tablet, Rfl: 0 .  hydrochlorothiazide (HYDRODIURIL) 25 MG tablet, Take 1 tablet (25 mg total) by mouth daily. Must have office visit for refills (Patient taking differently: Take 25 mg by mouth daily at 12 noon. Must have office visit for refills), Disp: 30 tablet, Rfl: 12 .  methocarbamol (ROBAXIN) 500 MG tablet, Take 1 tablet (500 mg total) by mouth 2 (two) times daily as needed for muscle spasms., Disp: 14 tablet, Rfl: 0 .  metroNIDAZOLE (FLAGYL) 500 MG tablet, Take 1 tablet (500 mg total) by mouth 2 (two) times daily. (Patient not taking: Reported on 10/26/2018), Disp: 14 tablet, Rfl: 0 .  mupirocin ointment (BACTROBAN) 2 %, Apply 1 application topically 2 (two) times daily. (Patient not taking: Reported on 08/06/2018), Disp: 22 g, Rfl: 0 .  naproxen (NAPROSYN) 500 MG tablet, Take 1 tablet (500 mg total) by mouth 2 (two) times daily with a meal., Disp: 21 tablet, Rfl: 0   Allergies  Allergen Reactions  . Doxycycline Hives    Past Medical History:  Diagnosis Date  . Asthma   . Depression   . Hypertension   . Obesity   . Ovarian cyst      History reviewed. No pertinent surgical history.  Family History  Problem Relation Age of Onset  . Hypertension Maternal Grandmother   . Hypertension Maternal Grandfather   . Hypertension Paternal Grandmother   . Diabetes Paternal Grandmother   . Hypertension Mother   . Diabetes Mother   . Hypothyroidism Mother     Social History   Tobacco Use  . Smoking status:  Current Every Day Smoker    Packs/day: 0.20    Types: Cigarettes    Start date: 08/08/2015    Last attempt to quit: 09/2015    Years since quitting: 3.8  . Smokeless tobacco: Former Systems developer    Quit date: 08/08/2015  Substance Use Topics  . Alcohol use: Yes    Alcohol/week: 0.0 standard drinks    Comment: occ  . Drug use: No    ROS   Objective:   Vitals: BP (!) 171/103 (BP Location: Left Arm)   Pulse 89   Temp 98.1 F (36.7 C) (Oral)   Resp 18   SpO2 97%   Physical Exam Constitutional:      General: She is not in acute distress.    Appearance: Normal appearance. She is well-developed. She is not ill-appearing, toxic-appearing or diaphoretic.  HENT:     Head: Normocephalic and atraumatic.     Nose: Nose normal.     Mouth/Throat:     Mouth: Mucous membranes are moist.     Pharynx: Oropharynx is clear.  Eyes:     General: No scleral icterus.    Extraocular Movements: Extraocular movements intact.     Pupils: Pupils are equal, round, and reactive to light.  Cardiovascular:     Rate and Rhythm: Normal rate.  Pulmonary:     Effort: Pulmonary effort is normal.  Musculoskeletal:       Back:  Skin:  General: Skin is warm and dry.  Neurological:     General: No focal deficit present.     Mental Status: She is alert and oriented to person, place, and time.  Psychiatric:        Mood and Affect: Mood normal.        Behavior: Behavior normal.        Thought Content: Thought content normal.        Judgment: Judgment normal.    PROCEDURE NOTE: I&D of Abscess Verbal consent obtained. Local anesthesia with 2cc of 2% lidocaine with epinephrine. Site cleansed with chlorhexidine. Incision of 1cm was made using a 11 blade, discharge of ~3cc pus and serosanguinous fluid. Wound cavity was explored with curved hemostats. Cleansed and dressed.   Assessment and Plan :   1. Epidermoid cyst of neck   2. Neck pain    Start Keflex for infected cyst. I&D successfully performed. Use  naproxen for pain and inflammation. Wound care reviewed. Provided patient with one extra non-adherent dressing. Patient requested more but I informed her these could be obtained at the local pharmacy. Follow up with dermatology, [facility]. Counseled patient on potential for adverse effects with medications prescribed/recommended today, ER and return-to-clinic precautions discussed, patient verbalized understanding.     Wallis Bamberg, [place] 07/02/19 1845
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
## 2019-07-14 ENCOUNTER — Encounter: Payer: Self-pay | Admitting: Family Medicine

## 2019-07-14 ENCOUNTER — Ambulatory Visit (INDEPENDENT_AMBULATORY_CARE_PROVIDER_SITE_OTHER): Payer: Self-pay | Admitting: Family Medicine

## 2019-07-14 ENCOUNTER — Other Ambulatory Visit: Payer: Self-pay

## 2019-07-14 ENCOUNTER — Other Ambulatory Visit (HOSPITAL_COMMUNITY)
Admission: RE | Admit: 2019-07-14 | Discharge: 2019-07-14 | Disposition: A | Discharge status: Home / Self Care | Payer: Medicaid Other | Source: Ambulatory Visit | Attending: Family Medicine | Admitting: Family Medicine

## 2019-07-14 VITALS — BP 132/82 | HR 78 | Wt 335.8 lb

## 2019-07-14 DIAGNOSIS — L83 Acanthosis nigricans: Secondary | ICD-10-CM

## 2019-07-14 DIAGNOSIS — Z113 Encounter for screening for infections with a predominantly sexual mode of transmission: Secondary | ICD-10-CM | POA: Diagnosis present

## 2019-07-14 DIAGNOSIS — I10 Essential (primary) hypertension: Secondary | ICD-10-CM

## 2019-07-14 DIAGNOSIS — L720 Epidermal cyst: Secondary | ICD-10-CM

## 2019-07-14 LAB — POCT WET PREP (WET MOUNT)
Clue Cells Wet Prep Whiff POC: NEGATIVE
Trichomonas Wet Prep HPF POC: ABSENT

## 2019-07-14 NOTE — Assessment & Plan Note (Signed)
Asymptomatic screening.  Obtained wet prep, GC/chlamydia, HIV, RPR.  Encouraged routine condom use.

## 2019-07-14 NOTE — Progress Notes (Signed)
    SUBJECTIVE:   CHIEF COMPLAINT / HPI: STD screening/cyst follow-up  STD screening: States it has been over a year, just wants annual screening since it has been sometime. Sexually active with males, uses protection, last several weeks ago.  Denies any dysuria, abdominal pain, fever, vaginal discharge, vaginal itching/irritation.  No known STDs exposures that she is aware of.  Cyst follow-up: Had I&D of epidermoid cyst on her posterior neck on 07/02/2019 at urgent care, tolerated this well.  Sought treatment due to pain in that area with neck movements.  States it feels significantly better, has not been able to look at the area since she had it done.  No longer tender, however the outside skin itches frequently.  Prescribed antibiotics at that time, delay in picking them up.   PERTINENT  PMH / PSH: Hypertension, asthma, tobacco use, elevated BMI, history of chlamydia  OBJECTIVE:   BP 132/82   Pulse 78   Wt (!) 335 lb 12.8 oz (152.3 kg)   SpO2 98%   BMI 55.88 kg/m   General: Alert, NAD HEENT: NCAT, MMM Lungs: no increased WOB  Abdomen: soft Msk: Moves all extremities spontaneously  Ext: Warm, dry Pelvic: Pelvic exam: VULVA: normal appearing vulva with no masses, tenderness or lesions, VAGINA: normal appearing vagina with normal color and discharge, no lesions, CERVIX: normal appearing cervix without discharge or lesions, cervical motion tenderness absent.  Wet prep and GC/chlamydia collected.  Chaperoned by CMA. Skin: Approximate 2 cm x 1 cm mildly fluctuant mass present on her posterior right neck, picture below.  No noticeable incision site remaining, nontender to palpation.       ASSESSMENT/PLAN:   Screening examination for STD (sexually transmitted disease) Asymptomatic screening.  Obtained wet prep, GC/chlamydia, HIV, RPR.  Encouraged routine condom use.  Epidermoid cyst of neck Significantly improved since I&D on 2/19, however small mildly fluctuant area still remains,  has not completed antibiotic course yet.  Discussed that this region is stable for now, however would likely expect recurrence especially with still some fluctuance present.  Recommended follow-up if region enlarges, becomes tender, or has worsening erythema/fever. Should have surgically removed at that point rather than I&D to hopefully avoid recurrence.   Acanthosis nigricans Present on posterior neck, can see in image of epidermoid cyst above.  Last A1c 5.0 in 2013, has had few elevated random glucose readings on previous BMPs.  Recommend screening A1c on follow-up, especially with comorbid conditions including elevated BMI and hypertension.  HYPERTENSION, BENIGN SYSTEMIC Reasonable control on HCTZ.  Recommended follow-up with her PCP for chronic conditions and screening as above.    Follow-up if cyst not continuing to improve or worsening, recommended chronic condition follow-up with PCP at convenience.  Allayne Stack, DO [facility]

## 2019-07-14 NOTE — Assessment & Plan Note (Signed)
Present on posterior neck, can see in image of epidermoid cyst above.  Last A1c 5.0 in 2013, has had few elevated random glucose readings on previous BMPs.  Recommend screening A1c on follow-up, especially with comorbid conditions including elevated BMI and hypertension.

## 2019-07-14 NOTE — Assessment & Plan Note (Signed)
Reasonable control on HCTZ.  Recommended follow-up with her PCP for chronic conditions and screening as above.

## 2019-07-14 NOTE — Patient Instructions (Addendum)
It was wonderful to see you! I will let you know your results in the next few days. Please make sure you follow up with Dr. Dareen Piano for your chronic conditions. Take your antibiotic and let us know if the area grows in size, becames tender and warm to touch.   I have also passed along your FL2 form to her for [place], hopefully that will be ready by sometime next week.

## 2019-07-14 NOTE — Assessment & Plan Note (Signed)
Significantly improved since I&D on 2/19, however small mildly fluctuant area still remains, has not completed antibiotic course yet.  Discussed that this region is stable for now, however would likely expect recurrence especially with still some fluctuance present.  Recommended follow-up if region enlarges, becomes tender, or has worsening erythema/fever. Should have surgically removed at that point rather than I&D to hopefully avoid recurrence.

## 2019-07-15 LAB — CERVICOVAGINAL ANCILLARY ONLY
Chlamydia: NEGATIVE
Comment: NEGATIVE
Comment: NORMAL
Neisseria Gonorrhea: NEGATIVE

## 2019-07-15 LAB — HIV ANTIBODY (ROUTINE TESTING W REFLEX): HIV Screen 4th Generation wRfx: NONREACTIVE

## 2019-07-15 LAB — RPR: RPR Ser Ql: NONREACTIVE

## 2019-07-27 ENCOUNTER — Ambulatory Visit (INDEPENDENT_AMBULATORY_CARE_PROVIDER_SITE_OTHER): Payer: Self-pay

## 2019-07-27 ENCOUNTER — Ambulatory Visit (HOSPITAL_COMMUNITY)
Admission: EM | Admit: 2019-07-27 | Discharge: 2019-07-27 | Disposition: A | Discharge status: Home / Self Care | Payer: Self-pay | Attending: Emergency Medicine | Admitting: Emergency Medicine

## 2019-07-27 ENCOUNTER — Other Ambulatory Visit: Payer: Self-pay

## 2019-07-27 ENCOUNTER — Encounter (HOSPITAL_COMMUNITY): Payer: Self-pay

## 2019-07-27 DIAGNOSIS — Z3202 Encounter for pregnancy test, result negative: Secondary | ICD-10-CM

## 2019-07-27 DIAGNOSIS — M542 Cervicalgia: Secondary | ICD-10-CM

## 2019-07-27 DIAGNOSIS — R221 Localized swelling, mass and lump, neck: Secondary | ICD-10-CM | POA: Insufficient documentation

## 2019-07-27 DIAGNOSIS — Z113 Encounter for screening for infections with a predominantly sexual mode of transmission: Secondary | ICD-10-CM | POA: Insufficient documentation

## 2019-07-27 DIAGNOSIS — N760 Acute vaginitis: Secondary | ICD-10-CM | POA: Insufficient documentation

## 2019-07-27 HISTORY — DX: Anxiety disorder, unspecified: F41.9

## 2019-07-27 LAB — POCT URINALYSIS DIP (DEVICE)
Bilirubin Urine: NEGATIVE
Glucose, UA: NEGATIVE mg/dL
Hgb urine dipstick: NEGATIVE
Ketones, ur: NEGATIVE mg/dL
Leukocytes,Ua: NEGATIVE
Nitrite: NEGATIVE
Protein, ur: NEGATIVE mg/dL
Specific Gravity, Urine: >=1.03 (ref 1.005–1.030)
Urobilinogen, UA: 0.2 mg/dL (ref 0.0–1.0)
pH: 5.5 (ref 5.0–8.0)

## 2019-07-27 LAB — POCT PREGNANCY, URINE: Preg Test, Ur: NEGATIVE

## 2019-07-27 LAB — POC URINE PREG, ED: Preg Test, Ur: NEGATIVE

## 2019-07-27 LAB — POCT RAPID STREP A: Streptococcus, Group A Screen (Direct): NEGATIVE

## 2019-07-27 MED ORDER — CEFTRIAXONE SODIUM 500 MG IJ SOLR: 500.0000 mg | Freq: Once | INTRAMUSCULAR | Status: DC

## 2019-07-27 MED ORDER — AZITHROMYCIN 250 MG PO TABS
ORAL_TABLET | ORAL | Status: AC
  Filled 2019-07-27: qty 4

## 2019-07-27 MED ORDER — LIDOCAINE HCL (PF) 1 % IJ SOLN
INTRAMUSCULAR | Status: AC
  Filled 2019-07-27: qty 2

## 2019-07-27 MED ORDER — FLUCONAZOLE 150 MG PO TABS: 150.0000 mg | ORAL_TABLET | Freq: Once | ORAL | 1 refills | Status: AC

## 2019-07-27 MED ORDER — AZITHROMYCIN 250 MG PO TABS: 1000.0000 mg | ORAL_TABLET | Freq: Once | ORAL | Status: DC

## 2019-07-27 MED ORDER — CEFTRIAXONE SODIUM 500 MG IJ SOLR
INTRAMUSCULAR | Status: AC
  Filled 2019-07-27: qty 500

## 2019-07-27 NOTE — ED Provider Notes (Signed)
HPI  SUBJECTIVE: Patient reports of 2 issues today.  First she reports a "swollen throat" along the midline of her anterior neck.  She reports a hoarse raspy voice.  She absolutely denies sore throat, pain with swallowing or eating.  She had nasal congestion, cough 4 days ago that has resolved with OTC allergy medication.  No sensation of throat swelling shut, drooling trismus difficulty breathing.  No recent viral illnesses.  No fevers.  She states that her GERD has been bothering her.  No body aches headaches loss of sense of smell or taste shortness of breath nausea vomiting diarrhea abdominal pain Covid exposure.  No neck stiffness.  She has not tried anything for the throat swelling.  There are no aggravating or alleviating factors.  Patient also reports vaginal irritation, itching, vulvar swelling, burning vaginal pain and brown nonodorous vaginal discharge starting yesterday.  Reports dysuria, but no other urinary complaints.  States she recently had intercourse with 2 new female partners who are asymptomatic to her knowledge.  She used condoms with both of them.  STDs are a concern today.  She finished antibiotics for a cyst about 2 weeks ago.  She denies perfumed soaps or body washes.  She states this feels like a yeast infection.  No back abdominal pelvic pain.  No genital blisters or rash.  Has not tried anything for this.  No alleviating factors.  Symptoms are worse with bathing.  Past medical history of hypertension GERD allergic rhinitis allergies asthma smoking.  History of gonorrhea chlamydia trichomonas BV frequent yeast infections.  States this feels like a yeast infection.  Family history significant for thyroid disease in mom and sister.  LMP: Last month 2/3-2/9.  She states that she is normally irregular.  PMD: [facility].   Past Medical History:  Diagnosis Date  . Anxiety   . Asthma   . Depression   . Hypertension   . Obesity   . Ovarian cyst     History reviewed. No  pertinent surgical history.  Family History  Problem Relation Age of Onset  . Hypertension Maternal Grandmother   . Hypertension Maternal Grandfather   . Hypertension Paternal Grandmother   . Diabetes Paternal Grandmother   . Hypertension Mother   . Diabetes Mother   . Hypothyroidism Mother     Social History   Tobacco Use  . Smoking status: Current Every Day Smoker    Packs/day: 0.20    Types: Cigarettes    Start date: 08/08/2015    Last attempt to quit: 09/2015    Years since quitting: 3.8  . Smokeless tobacco: Former Neurosurgeon    Quit date: 08/08/2015  Substance Use Topics  . Alcohol use: Yes    Alcohol/week: 0.0 standard drinks    Comment: occ  . Drug use: No     Current Facility-Administered Medications:  .  azithromycin (ZITHROMAX) tablet 1,000 mg, 1,000 mg, Oral, Once, Domenick Gong, MD .  cefTRIAXone (ROCEPHIN) injection 500 mg, 500 mg, Intramuscular, Once, Domenick Gong, MD  Current Outpatient Medications:  .  FLUoxetine (PROZAC) 20 MG capsule, Take 20 mg by mouth daily., Disp: , Rfl:  .  hydrochlorothiazide (HYDRODIURIL) 25 MG tablet, Take 1 tablet (25 mg total) by mouth daily. Must have office visit for refills (Patient taking differently: Take 25 mg by mouth daily at 12 noon. Must have office visit for refills), Disp: 30 tablet, Rfl: 12 .  hydrOXYzine (ATARAX/VISTARIL) 25 MG tablet, Take 25 mg by mouth every 8 (eight) hours  as needed for anxiety., Disp: , Rfl:  .  QUEtiapine (SEROQUEL) 50 MG tablet, Take 50 mg by mouth at bedtime., Disp: , Rfl:  .  fluconazole (DIFLUCAN) 150 MG tablet, Take 1 tablet (150 mg total) by mouth once for 1 dose. 1 tab po x 1. May repeat in 72 hours if no improvement, Disp: 2 tablet, Rfl: 1 .  meloxicam (MOBIC) 15 MG tablet, Take 15 mg by mouth daily., Disp: , Rfl:  .  naproxen (NAPROSYN) 500 MG tablet, Take 1 tablet (500 mg total) by mouth 2 (two) times daily., Disp: 30 tablet, Rfl: 0  Allergies  Allergen Reactions  . Doxycycline  Hives     ROS  As noted in HPI.   Physical Exam  BP (!) 151/98 (BP Location: Left Wrist)   Pulse 86   Temp 98.1 F (36.7 C) (Oral)   Resp 20   LMP 06/16/2019   SpO2 99%   Constitutional: Well developed, well nourished, no acute distress Eyes:  EOMI, conjunctiva normal bilaterally HENT: Normocephalic, atraumatic,mucus membranes moist. + mild nasal congestion - erythematous oropharynx +enlarged tonsils - exudates. Uvula midline.  No petechiae on palate.  Hoarse voice.  No drooling trismus stridor Neck: Questionable enlarged nontender thyroid.  No anterior posterior cervical lymphadenopathy. Respiratory: Normal inspiratory effort Cardiovascular: Normal rate GI: nondistended skin: No rash, skin intact Musculoskeletal: no deformities Neurologic: Alert & oriented x 3, no focal neuro deficits Psychiatric: Speech and behavior appropriate.   ED Course   Medications  azithromycin (ZITHROMAX) tablet 1,000 mg (has no administration in time range)  cefTRIAXone (ROCEPHIN) injection 500 mg (has no administration in time range)    Orders Placed This Encounter  Procedures  . Culture, group A strep (throat)    Standing Status:   Standing    Number of Occurrences:   1  . DG Neck Soft Tissue    Standing Status:   Standing    Number of Occurrences:   1    Order Specific Question:   Reason for Exam (SYMPTOM  OR DIAGNOSIS REQUIRED)    Answer:   r/o epiglottitis  . POCT urinalysis dip (device)    Standing Status:   Standing    Number of Occurrences:   1  . POCT rapid strep A [facility])    Standing Status:   Standing    Number of Occurrences:   1  . Pregnancy, urine POC    Standing Status:   Standing    Number of Occurrences:   1    Results for orders placed or performed during the hospital encounter of 07/27/19 (from the past 24 hour(s))  POCT urinalysis dip (device)     Status: None   Collection Time: 07/27/19  8:39 AM  Result Value Ref Range   Glucose, UA NEGATIVE  NEGATIVE mg/dL   Bilirubin Urine NEGATIVE NEGATIVE   Ketones, ur NEGATIVE NEGATIVE mg/dL   Specific Gravity, Urine >=1.030 1.005 - 1.030   Hgb urine dipstick NEGATIVE NEGATIVE   pH 5.5 5.0 - 8.0   Protein, ur NEGATIVE NEGATIVE mg/dL   Urobilinogen, UA 0.2 0.0 - 1.0 mg/dL   Nitrite NEGATIVE NEGATIVE   Leukocytes,Ua NEGATIVE NEGATIVE  POC urine pregnancy     Status: None   Collection Time: 07/27/19  9:46 AM  Result Value Ref Range   Preg Test, Ur NEGATIVE NEGATIVE  POCT rapid strep A [facility])     Status: None   Collection Time: 07/27/19  9:46 AM  Result  Value Ref Range   Streptococcus, Group A Screen (Direct) NEGATIVE NEGATIVE  Pregnancy, urine POC     Status: None   Collection Time: 07/27/19  9:46 AM  Result Value Ref Range   Preg Test, Ur NEGATIVE NEGATIVE   DG Neck Soft Tissue  Result Date: 07/27/2019 CLINICAL DATA:  Pain and swelling EXAM: NECK SOFT TISSUES - 1+ VIEW COMPARISON:  CT 10/29/2018 FINDINGS: There is no evidence of retropharyngeal soft tissue swelling or epiglottic enlargement. The cervical airway is unremarkable and no radio-opaque foreign body identified. IMPRESSION: Negative. Electronically Signed   By: Duanne Guess D.O.   On: 07/27/2019 10:09    ED Clinical Impression  1. Neck swelling   2. Vaginitis and vulvovaginitis   3. Screening examination for STD (sexually transmitted disease)      ED Assessment/Plan   1.  Swollen throat.  Could be a goiter.  Does not appear to be thyroiditis as she has no tenderness along her anterior neck.  Difficult to say definitively if there is a neck mass due to body habitus.  No appreciable lymph nodes.  She does have a hoarse voice, so we will get lateral neck to rule out epiglottitis.  Rapid strep negative.  X-ray independently reviewed.  No epiglottitis.  See radiology report for details  2.  Vaginal discharge and missed menses-suspect yeast infection and early menses.  Sending urine pregnancy, UA  gonorrhea  chlamydia wet prep.   U pregnant negative UA negative for UTI   Patient wishes to be treated for gonorrhea chlamydia today.  She gets hives with doxycycline.  Will give Rocephin 500 mg and azithromycin 1 g.  Plan to send home with Diflucan given recent antibiotic use.  Patient absolutely denies genital rash/blisters suggestive of herpes.  She will follow-up with her doctor if this does not get better.  She declined HIV, syphilis testing.  Patient left prior to getting Rocephin/azithromycin.  Will call in the appropriate prescriptions and/or call her back if her gonorrhea chlamydia come back positive.  Discussed labs,  MDM, plan and followup with patient. Discussed sn/sx that should prompt return to the ED. patient agrees with plan.   Meds ordered this encounter  Medications  . azithromycin (ZITHROMAX) tablet 1,000 mg  . cefTRIAXone (ROCEPHIN) injection 500 mg  . fluconazole (DIFLUCAN) 150 MG tablet    Sig: Take 1 tablet (150 mg total) by mouth once for 1 dose. 1 tab po x 1. May repeat in 72 hours if no improvement    Dispense:  2 tablet    Refill:  1     *This clinic note was created using Scientist, clinical (histocompatibility and immunogenetics). Therefore, there may be occasional mistakes despite careful proofreading.    Domenick Gong, MD 07/27/19 1019

## 2019-07-27 NOTE — ED Notes (Signed)
1010 Pt was advised that medications were being prepared and awaiting xray read by medical provider. Stated understanding. At approx 1012 as RN finished prepping medications, pt left prior to administration.

## 2019-07-27 NOTE — Discharge Instructions (Addendum)
Your neck x-ray was negative for epiglottitis.  The swelling could be goiter or other thyroid problem.  If it persists after 3 to 4 days, follow-up with your primary care physician.  Your urine pregnancy is negative.  You were treated for gonorrhea and chlamydia today.  Your trichomonas, BV, yeast tests are still pending.  I am treating you for yeast with Diflucan.  We will call in the appropriate prescription if you come back positive for BV or Trichomonas.  No intercourse until you know what your results are your partner(s) are treated if necessary.

## 2019-07-27 NOTE — ED Notes (Signed)
Patient approached nurse station and states she is leaving.  States "she has somewhere she has to be".

## 2019-07-27 NOTE — ED Triage Notes (Signed)
Pt c/o nasal congestion approx onset 3-4 days ago. Also report acute onset sore throat, hoarseness and cough yesterday. Denies abdom pain, n/v/d, dysuria sx.  Also c/o vaginal itching, irritation, discharge of "brown color"; recently completed ABX tx for cyst I & D. Also states she did not have a menstrual period this past month.

## 2019-07-28 ENCOUNTER — Telehealth: Payer: Self-pay | Admitting: Student in an Organized Health Care Education/Training Program

## 2019-07-28 LAB — CULTURE, GROUP A STREP (THRC)

## 2019-07-28 NOTE — Telephone Encounter (Signed)
Called patient about FL2 form and she states that she has multiple mental diagnoses and medications which will qualify her for housing. She is going to bring in the information to the clinic for review tomorrow morning. I can fill out then.

## 2019-09-13 ENCOUNTER — Ambulatory Visit
Admission: EM | Admit: 2019-09-13 | Discharge: 2019-09-13 | Disposition: A | Discharge status: Home / Self Care | Payer: Medicaid Other | Attending: Emergency Medicine | Admitting: Emergency Medicine

## 2019-09-13 ENCOUNTER — Other Ambulatory Visit: Payer: Self-pay

## 2019-09-13 ENCOUNTER — Encounter: Payer: Self-pay | Admitting: Emergency Medicine

## 2019-09-13 DIAGNOSIS — L720 Epidermal cyst: Secondary | ICD-10-CM

## 2019-09-13 MED ORDER — CEPHALEXIN 500 MG PO CAPS: 500.0000 mg | ORAL_CAPSULE | Freq: Four times a day (QID) | ORAL | 0 refills | Status: DC

## 2019-09-13 NOTE — ED Provider Notes (Signed)
[place]   [id number] 09/13/19 Arrival Time: [place]   PF:[id number]  SUBJECTIVE:  ALONNAH LAMPKINS is a 29 y.o. female who presents with a possible abscess of her posterior neck. Onset gradual, approximately 7 days ago.   ROS: As per HPI.  All other pertinent ROS negative.     Past Medical History:  Diagnosis Date  . Anxiety   . Asthma   . Depression   . Hypertension   . Obesity   . Ovarian cyst    History reviewed. No pertinent surgical history. Allergies  Allergen Reactions  . Doxycycline Hives   No current facility-administered medications on file prior to encounter.   Current Outpatient Medications on File Prior to Encounter  Medication Sig Dispense Refill  . FLUoxetine (PROZAC) 20 MG capsule Take 20 mg by mouth daily.    . hydrochlorothiazide (HYDRODIURIL) 25 MG tablet Take 1 tablet (25 mg total) by mouth daily. Must have office visit for refills (Patient taking differently: Take 25 mg by mouth daily at 12 noon. Must have office visit for refills) 30 tablet 12  . hydrOXYzine (ATARAX/VISTARIL) 25 MG tablet Take 25 mg by mouth every 8 (eight) hours as needed for anxiety.    . meloxicam (MOBIC) 15 MG tablet Take 15 mg by mouth daily.    . naproxen (NAPROSYN) 500 MG tablet Take 1 tablet (500 mg total) by mouth 2 (two) times daily. 30 tablet 0  . QUEtiapine (SEROQUEL) 50 MG tablet Take 50 mg by mouth at bedtime.     Social History   Socioeconomic History  . Marital status: Single    Spouse name: Not on file  . Number of children: Not on file  . Years of education: Not on file  . Highest education level: Not on file  Occupational History  . Not on file  Tobacco Use  . Smoking status: Current Every Day Smoker    Packs/day: 0.20    Types: Cigarettes    Start date: 08/08/2015    Last attempt to quit: 09/2015    Years since quitting: 4.0  . Smokeless tobacco: Former Systems developer    Quit date: 08/08/2015  Substance and Sexual Activity  . Alcohol use: Yes   Alcohol/week: 0.0 standard drinks    Comment: occ  . Drug use: No  . Sexual activity: Yes  Other Topics Concern  . Not on file  Social History Narrative  . Not on file   Social Determinants of Health   Financial Resource Strain:   . Difficulty of Paying Living Expenses:   Food Insecurity:   . Worried About Charity fundraiser in the Last Year:   . Arboriculturist in the Last Year:   Transportation Needs:   . Film/video editor (Medical):   Marland Kitchen Lack of Transportation (Non-Medical):   Physical Activity:   . Days of Exercise per Week:   . Minutes of Exercise per Session:   Stress:   . Feeling of Stress :   Social Connections:   . Frequency of Communication with Friends and Family:   . Frequency of Social Gatherings with Friends and Family:   . Attends Religious Services:   . Active Member of Clubs or Organizations:   . Attends Archivist Meetings:   Marland Kitchen Marital Status:   Intimate Partner Violence:   . Fear of Current or Ex-Partner:   . Emotionally Abused:   Marland Kitchen Physically Abused:   . Sexually Abused:    Family History  Problem Relation Age of Onset  . Hypertension Maternal Grandmother   . Hypertension Maternal Grandfather   . Hypertension Paternal Grandmother   . Diabetes Paternal Grandmother   . Hypertension Mother   . Diabetes Mother   . Hypothyroidism Mother     OBJECTIVE:  Vitals:   09/13/19 1558 09/13/19 1603  BP: (!) 171/124 (!) 160/102  Pulse: 100   Resp: 20   Temp: 98.6 F (37 C)   TempSrc: Oral   SpO2: 97%      General appearance: alert; no distress Skin: Approximately 2.5-3 cm induration of her posterior neck.  Tender to touch; no active drainage Psychological: alert and cooperative; normal mood and affect  Procedure: Verbal consent obtained. Area over induration cleaned with betadine. Lidocaine 2% with epinephrine used to obtain local anesthesia. The most fluctuant portion of the abscess was incised with a #11 blade scalpel. Abscess  cavity explored with minimal evacuation. Loculations broken up with a curved hemostat as best as possible given patient discomfort.  Open for drainage  and dressed with a clean gauze dressing. Minimal bleeding. No complications.  ASSESSMENT & PLAN:  1. Epidermal cyst of neck     Meds ordered this encounter  Medications  . cephALEXin (KEFLEX) 500 MG capsule    Sig: Take 1 capsule (500 mg total) by mouth 4 (four) times daily.    Dispense:  20 capsule    Refill:  0     Discharge instruction Keep dry and covered for next 24-48 hours Remove packing in 48 hours either at home or return here After packing is removed in you may then begin appling warm compresses 3-4x daily for 10-15 minutes.  You may then wash site daily with warm water and mild soap Keep covered to avoid friction Take antibiotic as prescribed and to completion Return sooner or go to the ED if you have any new or worsening symptoms such as increased redness, swelling, pain, nausea, vomiting, fever, chills, etc...   Reviewed expectations re: course of current medical issues. Questions answered. Outlined signs and symptoms indicating need for more acute intervention. Patient verbalized understanding. After Visit Summary given.          Durward Parcel, FNP 09/13/19 1640

## 2019-09-13 NOTE — ED Triage Notes (Signed)
Cyst on back of neck several months ago.  Noticed a reoccurrence 2 days ago.  Today it is really bad

## 2019-09-13 NOTE — Discharge Instructions (Signed)
Keep dry and covered for next 24-48 hours Remove packing in 48 hours either at home or return here After packing is removed in you may then begin appling warm compresses 3-4x daily for 10-15 minutes.  You may then wash site daily with warm water and mild soap Keep covered to avoid friction Take antibiotic as prescribed and to completion Return sooner or go to the ED if you have any new or worsening symptoms such as increased redness, swelling, pain, nausea, vomiting, fever, chills, etc...  

## 2020-02-25 ENCOUNTER — Other Ambulatory Visit: Payer: Self-pay

## 2020-02-25 ENCOUNTER — Encounter (HOSPITAL_COMMUNITY): Payer: Self-pay

## 2020-02-25 ENCOUNTER — Telehealth (HOSPITAL_COMMUNITY): Payer: Self-pay | Admitting: Emergency Medicine

## 2020-02-25 ENCOUNTER — Ambulatory Visit (HOSPITAL_COMMUNITY)
Admission: EM | Admit: 2020-02-25 | Discharge: 2020-02-25 | Disposition: A | Discharge status: Home / Self Care | Payer: Medicaid Other | Attending: Family Medicine | Admitting: Family Medicine

## 2020-02-25 DIAGNOSIS — L0211 Cutaneous abscess of neck: Secondary | ICD-10-CM | POA: Diagnosis present

## 2020-02-25 MED ORDER — SULFAMETHOXAZOLE-TRIMETHOPRIM 800-160 MG PO TABS: 1.0000 | ORAL_TABLET | Freq: Two times a day (BID) | ORAL | 0 refills | Status: AC

## 2020-02-25 MED ORDER — HYDROCHLOROTHIAZIDE 25 MG PO TABS: 25.0000 mg | ORAL_TABLET | Freq: Every day | ORAL | 12 refills | Status: DC

## 2020-02-25 NOTE — Discharge Instructions (Addendum)
Take Bactrim 1 tablet 2 times daily (about 12 hours apart).   You have a follow up appt with Dr. Jamelle Rushing at [facility] 10/27 at 8:50am (arrive by 8:35).  If you have worsening redness, swelling, feel fevers, body aches, chills, etc, please seek emergency medical care.   Dr Peggyann Shoals [facility]

## 2020-02-25 NOTE — Telephone Encounter (Signed)
Received notification that patient's prescriptions did not go through.  Verified transmission failed.  Called and left voicemail for verbal orders

## 2020-02-25 NOTE — ED Triage Notes (Signed)
Pt states she has had a cyst develop on her nape of her neck slightly right of centerline. Pt states this is the third occurrence in the same location in the last year. Pt is aox4 and ambulatory.

## 2020-02-28 DIAGNOSIS — L0211 Cutaneous abscess of neck: Secondary | ICD-10-CM | POA: Diagnosis present

## 2020-02-28 NOTE — ED Provider Notes (Signed)
[facility]    CSN: [id number] Arrival date & time: 02/25/20  1149      History   Chief Complaint Chief Complaint  Patient presents with   Cyst    x 2 days    HPI Sara Taylor is a 29 y.o. female presenting to urgent care with concerns for an abscess on the back of her neck which she has had drained twice before.  She reports that it is now occurring for a third time in the same location as the last.  She started having swelling in the area last night or this morning.  She denies any fevers, body aches, chills.   HPI  Past Medical History:  Diagnosis Date   Anxiety    Asthma    Depression    Hypertension    Obesity    Ovarian cyst     Patient Active Problem List   Diagnosis Date Noted   Epidermoid cyst of neck 07/14/2019   Acanthosis nigricans 07/14/2019   Asthma 01/03/2016   Vaginal discharge 03/02/2014   Preventative health care 06/02/2013   Screening examination for STD (sexually transmitted disease) 06/02/2013   Encounter for long-term (current) use of medications 05/26/2012   History of chlamydia 05/26/2012   TOBACCO USE 06/09/2008   OBESITY, NOS 07/10/2006   HYPERTENSION, BENIGN SYSTEMIC 07/10/2006   RHINITIS, ALLERGIC 07/10/2006    History reviewed. No pertinent surgical history.  OB History   No obstetric history on file.      Home Medications    Prior to Admission medications   Medication Sig Start Date End Date Taking? Authorizing Provider  hydrochlorothiazide (HYDRODIURIL) 25 MG tablet Take 1 tablet (25 mg total) by mouth daily. Must have office visit for refills 02/25/20   Peggyann Shoals C, DO  hydrOXYzine (ATARAX/VISTARIL) 25 MG tablet Take 25 mg by mouth every 8 (eight) hours as needed for anxiety.    [provider]  QUEtiapine (SEROQUEL) 50 MG tablet Take 50 mg by mouth at bedtime.    [provider]  sulfamethoxazole-trimethoprim (BACTRIM DS) 800-160 MG tablet Take 1 tablet by  mouth 2 (two) times daily for 7 days. 02/25/20 03/03/20  Dollene Cleveland, DO  FLUoxetine (PROZAC) 20 MG capsule Take 20 mg by mouth daily.  02/25/20  [provider]    Family History Family History  Problem Relation Age of Onset   Hypertension Maternal Grandmother    Hypertension Maternal Grandfather    Hypertension Paternal Grandmother    Diabetes Paternal Grandmother    Hypertension Mother    Diabetes Mother    Hypothyroidism Mother     Social History Social History   Tobacco Use   Smoking status: Current Every Day Smoker    Packs/day: 0.20    Types: Cigarettes    Start date: 08/08/2015    Last attempt to quit: 09/2015    Years since quitting: 4.4   Smokeless tobacco: Former Neurosurgeon    Quit date: 08/08/2015  Vaping Use   Vaping Use: Never used  Substance Use Topics   Alcohol use: Yes    Alcohol/week: 0.0 standard drinks    Comment: occ   Drug use: No     Allergies   Doxycycline   Review of Systems Review of Systems-see HPI   Physical Exam Triage Vital Signs ED Triage Vitals  Enc Vitals Group     BP 02/25/20 1312 (!) 150/108     Pulse Rate 02/25/20 1312 81     Resp 02/25/20  1312 18     Temp 02/25/20 1312 98 F (36.7 C)     Temp Source 02/25/20 1312 Oral     SpO2 02/25/20 1312 100 %     Weight --      Height --      Head Circumference --      Peak Flow --      Pain Score 02/25/20 1313 0     Pain Loc --      Pain Edu? --      Excl. in GC? --    No data found.  Updated Vital Signs BP (!) 150/108 (BP Location: Right Arm)    Pulse 81    Temp 98 F (36.7 C) (Oral)    Resp 18    LMP 01/29/2020 (Approximate)    SpO2 100%   Physical exam: General: Well-appearing pleasant patient Respiratory: Comfortable work of breathing on room air Integumentary: Firm and mobile nodularity appreciated to base of patient's neck directly right of midline with small (7 mm) circular area of fluctuance, no drainage or evidence of cellulitis  appreciated   UC Treatments / Results  Labs (all labs ordered are listed, but only abnormal results are displayed) Labs Reviewed - No data to display  EKG   Radiology No results found.  Procedures Procedures (including critical care time)  Medications Ordered in UC Medications - No data to display  Initial Impression / Assessment and Plan / UC Course  I have reviewed the triage vital signs and the nursing notes.  Pertinent labs & imaging results that were available during my care of the patient were reviewed by me and considered in my medical decision making (see chart for details).   Abscess on neck: Patient with most likely recurrence of abscess in same location as before on the back of her neck directly right of midline.  Patient is well-appearing, not in any distress.  As the patient has had this site incised and drained before she would most likely benefit from specialist attention and preventing another recurrence of this wound.  Additionally, case discussed with colleague who agrees that area of fluctuance is too small to be incised and drained at this time. -Patient given prescription for Bactrim 1 tablet 2 times daily (patient allergic to doxycycline) -Schedule follow-up appointment with PCP on 10/27 -Strict return precautions provided   Final Clinical Impressions(s) / UC Diagnoses   Final diagnoses:  None     Discharge Instructions     Take Bactrim 1 tablet 2 times daily (about 12 hours apart).   You have a follow up appt with Dr. Jamelle Rushing at [facility] 10/27 at 8:50am (arrive by 8:35).  If you have worsening redness, swelling, feel fevers, body aches, chills, etc, please seek emergency medical care.   Dr Peggyann Shoals [facility]    ED Prescriptions    Medication Sig Dispense Auth. Provider   hydrochlorothiazide (HYDRODIURIL) 25 MG tablet Take 1 tablet (25 mg total) by mouth daily. Must have office visit for refills 30 tablet  Peggyann Shoals C, DO   sulfamethoxazole-trimethoprim (BACTRIM DS) 800-160 MG tablet Take 1 tablet by mouth 2 (two) times daily for 7 days. 14 tablet Dollene Cleveland, DO     PDMP not reviewed this encounter.   Peggyann Shoals, DO [facility], PGY-3 02/28/2020 9:51 PM    Dollene Cleveland, DO 02/28/20 2154

## 2020-03-08 ENCOUNTER — Encounter: Payer: Self-pay | Admitting: Student in an Organized Health Care Education/Training Program

## 2020-03-08 ENCOUNTER — Ambulatory Visit (INDEPENDENT_AMBULATORY_CARE_PROVIDER_SITE_OTHER): Payer: Self-pay | Admitting: Student in an Organized Health Care Education/Training Program

## 2020-03-08 ENCOUNTER — Other Ambulatory Visit: Payer: Self-pay

## 2020-03-08 DIAGNOSIS — I10 Essential (primary) hypertension: Secondary | ICD-10-CM

## 2020-03-08 DIAGNOSIS — L720 Epidermal cyst: Secondary | ICD-10-CM

## 2020-03-08 NOTE — Progress Notes (Signed)
    SUBJECTIVE:   CHIEF COMPLAINT / HPI: UC f/u for cyst and HTN  HTN- endorses adherence with hctz. Denies negative SE. Denies CP, SOB, LEE, visual disturbances or HA. Does not have cuff at home to monitor.   Cyst on back of neck- took bactrim from UC and had resolution of redness/swelling/pain. Lesion still there but is not bothersome. Has not heard from surgery referral yet  OBJECTIVE:   BP 124/80   Pulse 86   Wt (!) 348 lb (157.9 kg)   LMP 03/03/2020 (Exact Date)   SpO2 99%   BMI 57.91 kg/m   General: NAD, pleasant, able to participate in exam Extremities: no edema. WWP. Skin: warm and dry, no rashes noted. Just right of midline near C6 is a hardened cyst without erythema, pain, edema.  Neuro: alert and oriented, no focal deficits Psych: Normal affect and mood  ASSESSMENT/PLAN:   Epidermoid cyst of neck - f/u with general surgery for removal as the cyst is near spine and deep. Does not appear to have any infection after bactrim from UC  HYPERTENSION, BENIGN SYSTEMIC BP well controlled on current regimen today.  Elevated BP at time of UC visit likely related to pain/anxiety of her cyst.  - recommended a home BP cuff and gave parameters to return       Leeroy Bock, DO [facility]

## 2020-03-08 NOTE — Patient Instructions (Signed)
It was a pleasure to see you today!  To summarize our discussion for this visit:  Your blood pressure is normal today. I would recommend to continue taking your medication daily and to purchase a BP cuff to monitor at home. If you are seeing Bps over 140 frequently, let us know and we can consider changing your medication for better control.  The referral for surgery has not gone through yet so please expect a call in the next week or so. I don't see any infection today but let me know if you have any more problems with the cyst before you are seen.  Some additional health maintenance measures we should update are: Health Maintenance Due  Topic Date Due   COVID-19 Vaccine (1) Never done   PAP-Cervical Cytology Screening  01/03/2019   PAP SMEAR-Modifier  01/03/2019   INFLUENZA VACCINE  Never done      Call the clinic at [phone] if your symptoms worsen or you have any concerns.   Thank you for allowing me to take part in your care,  Dr. Jamelle Rushing

## 2020-03-09 NOTE — Assessment & Plan Note (Signed)
BP well controlled on current regimen today.  Elevated BP at time of UC visit likely related to pain/anxiety of her cyst.  - recommended a home BP cuff and gave parameters to return

## 2020-03-09 NOTE — Assessment & Plan Note (Signed)
-   f/u with general surgery for removal as the cyst is near spine and deep. Does not appear to have any infection after bactrim from [facility]

## 2020-06-15 ENCOUNTER — Other Ambulatory Visit: Payer: Self-pay

## 2020-06-15 ENCOUNTER — Encounter (HOSPITAL_COMMUNITY): Payer: Self-pay

## 2020-06-15 ENCOUNTER — Ambulatory Visit (HOSPITAL_COMMUNITY)
Admission: EM | Admit: 2020-06-15 | Discharge: 2020-06-15 | Disposition: A | Discharge status: Home / Self Care | Payer: Self-pay | Attending: Family Medicine | Admitting: Family Medicine

## 2020-06-15 DIAGNOSIS — N760 Acute vaginitis: Secondary | ICD-10-CM | POA: Insufficient documentation

## 2020-06-15 NOTE — ED Provider Notes (Signed)
[facility]    CSN: [id number] Arrival date & time: 06/15/20  1343      History   Chief Complaint Chief Complaint  Patient presents with  . vaginal irritation    HPI Sara Taylor is a 30 y.o. female.   Here today with several days of vaginal irritation mostly noticed with wiping. Denies vaginal discharge, bleeding, hematuria, dysuria, urinary frequency, pelvic pain, fever, chills, N/V. LMP 05/29/2020. No known exposures to STIs but wants screening. Not trying anything OTC for sxs.      Past Medical History:  Diagnosis Date  . Anxiety   . Asthma   . Depression   . Hypertension   . Obesity   . Ovarian cyst     Patient Active Problem List   Diagnosis Date Noted  . Abscess of skin of neck 02/28/2020  . Epidermoid cyst of neck 07/14/2019  . Acanthosis nigricans 07/14/2019  . Asthma 01/03/2016  . Vaginal discharge 03/02/2014  . Preventative health care 06/02/2013  . Screening examination for STD (sexually transmitted disease) 06/02/2013  . Encounter for long-term (current) use of medications 05/26/2012  . History of chlamydia 05/26/2012  . TOBACCO USE 06/09/2008  . OBESITY, NOS 07/10/2006  . HYPERTENSION, BENIGN SYSTEMIC 07/10/2006  . RHINITIS, ALLERGIC 07/10/2006    History reviewed. No pertinent surgical history.  OB History   No obstetric history on file.      Home Medications    Prior to Admission medications   Medication Sig Start Date End Date Taking? Authorizing Provider  hydrochlorothiazide (HYDRODIURIL) 25 MG tablet Take 1 tablet (25 mg total) by mouth daily. Must have office visit for refills 02/25/20   Peggyann Shoals C, DO  hydrOXYzine (ATARAX/VISTARIL) 25 MG tablet Take 25 mg by mouth every 8 (eight) hours as needed for anxiety.    [provider]  QUEtiapine (SEROQUEL) 50 MG tablet Take 50 mg by mouth at bedtime.    [provider]  FLUoxetine (PROZAC) 20 MG capsule Take 20 mg by mouth daily.  02/25/20   [provider]    Family History Family History  Problem Relation Age of Onset  . Hypertension Maternal Grandmother   . Hypertension Maternal Grandfather   . Hypertension Paternal Grandmother   . Diabetes Paternal Grandmother   . Hypertension Mother   . Diabetes Mother   . Hypothyroidism Mother     Social History Social History   Tobacco Use  . Smoking status: Current Every Day Smoker    Packs/day: 0.20    Types: Cigarettes    Start date: 08/08/2015    Last attempt to quit: 09/2015    Years since quitting: 4.7  . Smokeless tobacco: Former Neurosurgeon    Quit date: 08/08/2015  Vaping Use  . Vaping Use: Never used  Substance Use Topics  . Alcohol use: Yes    Alcohol/week: 0.0 standard drinks    Comment: occ  . Drug use: No     Allergies   Doxycycline   Review of Systems Review of Systems PER HPI    Physical Exam Triage Vital Signs ED Triage Vitals  Enc Vitals Group     BP 06/15/20 1439 (!) 160/107     Pulse Rate 06/15/20 1439 80     Resp 06/15/20 1439 18     Temp 06/15/20 1439 98.6 F (37 C)     Temp src --      SpO2 06/15/20 1439 99 %     Weight --  Height --      Head Circumference --      Peak Flow --      Pain Score 06/15/20 1437 0     Pain Loc --      Pain Edu? --      Excl. in GC? --    No data found.  Updated Vital Signs BP (!) 160/107   Pulse 80   Temp 98.6 F (37 C)   Resp 18   LMP 05/29/2020 (Approximate)   SpO2 99%   Visual Acuity Right Eye Distance:   Left Eye Distance:   Bilateral Distance:    Right Eye Near:   Left Eye Near:    Bilateral Near:     Physical Exam Vitals and nursing note reviewed.  Constitutional:      Appearance: Normal appearance. She is not ill-appearing.  HENT:     Head: Atraumatic.  Eyes:     Extraocular Movements: Extraocular movements intact.     Conjunctiva/sclera: Conjunctivae normal.  Cardiovascular:     Rate and Rhythm: Normal rate and regular rhythm.     Heart sounds: Normal  heart sounds.  Pulmonary:     Effort: Pulmonary effort is normal.     Breath sounds: Normal breath sounds.  Abdominal:     General: Bowel sounds are normal. There is no distension.     Palpations: Abdomen is soft.     Tenderness: There is no abdominal tenderness. There is no right CVA tenderness, left CVA tenderness or guarding.  Genitourinary:    Comments: GU exam deferred, self swab performed Musculoskeletal:        General: Normal range of motion.     Cervical back: Normal range of motion and neck supple.  Skin:    General: Skin is warm and dry.  Neurological:     Mental Status: She is alert and oriented to person, place, and time.  Psychiatric:        Mood and Affect: Mood normal.        Thought Content: Thought content normal.        Judgment: Judgment normal.      UC Treatments / Results  Labs (all labs ordered are listed, but only abnormal results are displayed) Labs Reviewed  CERVICOVAGINAL ANCILLARY ONLY    EKG   Radiology No results found.  Procedures Procedures (including critical care time)  Medications Ordered in UC Medications - No data to display  Initial Impression / Assessment and Plan / UC Course  I have reviewed the triage vital signs and the nursing notes.  Pertinent labs & imaging results that were available during my care of the patient were reviewed by me and considered in my medical decision making (see chart for details).     Aptima swab pending, discussed aquaphor to vaginal tissue for now to protect from acidity of urine. Treat based on results. Declines U/A today.   Final Clinical Impressions(s) / UC Diagnoses   Final diagnoses:  Acute vaginitis   Discharge Instructions   None    ED Prescriptions    None     PDMP not reviewed this encounter.   Particia Nearing, [place] 06/15/20 1526

## 2020-06-15 NOTE — ED Triage Notes (Signed)
Pt in with c/o vaginal irritation that has been going on for a few days. States she just notices irritation when she wipes   Denies any vaginal discharge but is requesting std testing

## 2020-06-16 ENCOUNTER — Telehealth (HOSPITAL_COMMUNITY): Payer: Self-pay | Admitting: Emergency Medicine

## 2020-06-16 LAB — CERVICOVAGINAL ANCILLARY ONLY
Bacterial Vaginitis (gardnerella): POSITIVE — AB
Candida Glabrata: NEGATIVE
Candida Vaginitis: POSITIVE — AB
Chlamydia: NEGATIVE
Comment: NEGATIVE
Comment: NEGATIVE
Comment: NEGATIVE
Comment: NEGATIVE
Comment: NEGATIVE
Comment: NORMAL
Neisseria Gonorrhea: NEGATIVE
Trichomonas: NEGATIVE

## 2020-06-16 MED ORDER — FLUCONAZOLE 150 MG PO TABS: 150.0000 mg | ORAL_TABLET | Freq: Once | ORAL | 0 refills | Status: AC

## 2020-06-16 MED ORDER — METRONIDAZOLE 500 MG PO TABS: 500.0000 mg | ORAL_TABLET | Freq: Two times a day (BID) | ORAL | 0 refills | Status: DC

## 2020-07-25 ENCOUNTER — Ambulatory Visit (HOSPITAL_COMMUNITY)
Admission: EM | Admit: 2020-07-25 | Discharge: 2020-07-25 | Disposition: A | Discharge status: Home / Self Care | Payer: Self-pay | Attending: Urgent Care | Admitting: Urgent Care

## 2020-07-25 ENCOUNTER — Other Ambulatory Visit: Payer: Self-pay

## 2020-07-25 ENCOUNTER — Encounter (HOSPITAL_COMMUNITY): Payer: Self-pay | Admitting: Emergency Medicine

## 2020-07-25 DIAGNOSIS — N760 Acute vaginitis: Secondary | ICD-10-CM

## 2020-07-25 DIAGNOSIS — Z3202 Encounter for pregnancy test, result negative: Secondary | ICD-10-CM

## 2020-07-25 LAB — POC URINE PREG, ED: Preg Test, Ur: NEGATIVE

## 2020-07-25 NOTE — ED Triage Notes (Signed)
Pt states has to leave because ride was here to pick her up and could not wait for discharge papers.   Pt advised provider was in a room with another patient.

## 2020-07-25 NOTE — ED Provider Notes (Signed)
Patient left without being seen. She called an Benedetto Goad as soon as she got back into a room and provided a urine sample, cervical swab. Urine pregnancy test was run and was negative. Lab was called to cancel cervical swab order.    Wallis Bamberg, PA-C 07/25/20 1348

## 2020-07-25 NOTE — ED Triage Notes (Signed)
Vaginal discharge, itching.  This episode started 3 days ago.  Genital area is sore and tender  Denies urinary symptoms.

## 2020-08-01 ENCOUNTER — Other Ambulatory Visit (HOSPITAL_COMMUNITY)
Admission: RE | Admit: 2020-08-01 | Discharge: 2020-08-01 | Disposition: A | Discharge status: Home / Self Care | Payer: Medicaid Other | Source: Ambulatory Visit | Attending: Family Medicine | Admitting: Family Medicine

## 2020-08-01 ENCOUNTER — Encounter: Payer: Self-pay | Admitting: Student in an Organized Health Care Education/Training Program

## 2020-08-01 ENCOUNTER — Ambulatory Visit (INDEPENDENT_AMBULATORY_CARE_PROVIDER_SITE_OTHER): Payer: Medicaid Other | Admitting: Student in an Organized Health Care Education/Training Program

## 2020-08-01 ENCOUNTER — Other Ambulatory Visit: Payer: Self-pay

## 2020-08-01 VITALS — BP 126/82 | HR 97 | Ht 65.0 in | Wt 352.2 lb

## 2020-08-01 DIAGNOSIS — Z113 Encounter for screening for infections with a predominantly sexual mode of transmission: Secondary | ICD-10-CM

## 2020-08-01 DIAGNOSIS — A599 Trichomoniasis, unspecified: Secondary | ICD-10-CM

## 2020-08-01 DIAGNOSIS — Z124 Encounter for screening for malignant neoplasm of cervix: Secondary | ICD-10-CM

## 2020-08-01 LAB — POCT WET PREP (WET MOUNT): Clue Cells Wet Prep Whiff POC: NEGATIVE

## 2020-08-01 MED ORDER — METRONIDAZOLE 500 MG PO TABS: 500.0000 mg | ORAL_TABLET | Freq: Two times a day (BID) | ORAL | 0 refills | Status: AC

## 2020-08-01 NOTE — Progress Notes (Signed)
    SUBJECTIVE:   CHIEF COMPLAINT / HPI: pap  Patient states that she has no complaints of symptoms at this time and comes in for cervical cancer screening only today. She would like to have STI screening in addition.   OBJECTIVE:   BP 126/82   Pulse 97   Ht 5\' 5"  (1.651 m)   Wt (!) 352 lb 3.2 oz (159.8 kg)   LMP 07/23/2020   SpO2 96%   BMI 58.61 kg/m   Physical Exam Vitals and nursing note reviewed. Exam conducted with a chaperone present.  Constitutional:      General: She is not in acute distress.    Appearance: She is obese. She is not toxic-appearing.  Genitourinary:    Exam position: Lithotomy position.     Pubic Area: No rash.      Labia:        Right: No rash or lesion.        Left: No rash or lesion.      Vagina: Vaginal discharge (mild white mucus) and bleeding (mild from cervix) present. No lesions.     Cervix: Cervical bleeding present. No discharge, friability or erythema.  Neurological:     Mental Status: She is alert.     ASSESSMENT/PLAN:   Cervical cancer screening Pap as well as GC/chlamydia collected today Additionally got wet prep and HIV/RPR, per patient request but she endorses no symptoms.   Trichomonosis Wet prep positive for trich.  Patient informed at time of appointment.  Discussed partner treatment. Sending flagyl to her pharmacy     07/25/2020, DO [facility]

## 2020-08-01 NOTE — Assessment & Plan Note (Signed)
Pap as well as GC/chlamydia collected today Additionally got wet prep and HIV/RPR, per patient request but she endorses no symptoms.

## 2020-08-01 NOTE — Assessment & Plan Note (Signed)
Wet prep positive for trich.  Patient informed at time of appointment.  Discussed partner treatment. Sending flagyl to her pharmacy

## 2020-08-01 NOTE — Patient Instructions (Signed)
It was a pleasure to see you today!  To summarize our discussion for this visit:  We tested for STIs and cervical cancer today. I will contact you with your results via mychart and send in treatments to your pharmacy if they are needed.  Please let me know if you have any other concerns  Some additional health maintenance measures we should update are: Health Maintenance Due  Topic Date Due  . COVID-19 Vaccine (1) Never done  . PAP SMEAR-Modifier  01/03/2019  . INFLUENZA VACCINE  Never done  .    Call the clinic at [phone] if your symptoms worsen or you have any concerns.   Thank you for allowing me to take part in your care,  Dr. Jamelle Rushing

## 2020-08-02 LAB — RPR: RPR Ser Ql: NONREACTIVE

## 2020-08-02 LAB — HIV ANTIBODY (ROUTINE TESTING W REFLEX): HIV Screen 4th Generation wRfx: NONREACTIVE

## 2020-08-03 LAB — CYTOLOGY - PAP
Chlamydia: NEGATIVE
Comment: NEGATIVE
Comment: NEGATIVE
Comment: NEGATIVE
Comment: NORMAL
Diagnosis: NEGATIVE
High risk HPV: NEGATIVE
Neisseria Gonorrhea: NEGATIVE
Trichomonas: POSITIVE — AB

## 2020-09-05 ENCOUNTER — Encounter: Payer: Self-pay | Admitting: Student in an Organized Health Care Education/Training Program

## 2020-09-19 ENCOUNTER — Other Ambulatory Visit (HOSPITAL_COMMUNITY)
Admission: RE | Admit: 2020-09-19 | Discharge: 2020-09-19 | Disposition: A | Discharge status: Home / Self Care | Payer: Medicaid Other | Source: Ambulatory Visit | Attending: Family Medicine | Admitting: Family Medicine

## 2020-09-19 ENCOUNTER — Other Ambulatory Visit: Payer: Self-pay

## 2020-09-19 ENCOUNTER — Ambulatory Visit (INDEPENDENT_AMBULATORY_CARE_PROVIDER_SITE_OTHER): Payer: Medicaid Other | Admitting: Student in an Organized Health Care Education/Training Program

## 2020-09-19 ENCOUNTER — Encounter: Payer: Self-pay | Admitting: Student in an Organized Health Care Education/Training Program

## 2020-09-19 VITALS — BP 136/94 | HR 92 | Wt 339.2 lb

## 2020-09-19 DIAGNOSIS — Z113 Encounter for screening for infections with a predominantly sexual mode of transmission: Secondary | ICD-10-CM | POA: Insufficient documentation

## 2020-09-19 DIAGNOSIS — R030 Elevated blood-pressure reading, without diagnosis of hypertension: Secondary | ICD-10-CM | POA: Diagnosis not present

## 2020-09-19 LAB — POCT WET PREP (WET MOUNT)
Clue Cells Wet Prep Whiff POC: POSITIVE
Trichomonas Wet Prep HPF POC: ABSENT

## 2020-09-19 MED ORDER — METRONIDAZOLE 500 MG PO TABS: 500.0000 mg | ORAL_TABLET | Freq: Two times a day (BID) | ORAL | 0 refills | Status: AC

## 2020-09-19 NOTE — Assessment & Plan Note (Signed)
Elevated blood pressure reading today but is asymptomatic.  Intermittent elevated readings in the past.  Recommend rechecking at follow up and consider treatment after discussion with patient.

## 2020-09-19 NOTE — Assessment & Plan Note (Signed)
Collected wet prep, GC/chlamydia, RPR, HIV today Positive for BV, flagyl sent to pharmacy and patient informed.

## 2020-09-19 NOTE — Patient Instructions (Signed)
It was a pleasure to see you today!  To summarize our discussion for this visit:  We have screened for STIs today and I will send you your results via mychart and any treatment needed.  Some additional health maintenance measures we should update are: Health Maintenance Due  Topic Date Due  . COVID-19 Vaccine (1) Never done  .    Please return to our clinic to see me as needed.  Call the clinic at [phone] if your symptoms worsen or you have any concerns.   Thank you for allowing me to take part in your care,  Dr. Jamelle Rushing

## 2020-09-19 NOTE — Progress Notes (Signed)
    SUBJECTIVE:   CHIEF COMPLAINT / HPI: STI screen  STI screen- patient denies new sexual contacts since last screening. She denies any new symptoms. She would like to have repeat full screening today to ensure no infections. She completed her trichomonas treatment without issues.  OBJECTIVE:   BP (!) 136/94   Pulse 92   Wt (!) 339 lb 3.2 oz (153.9 kg)   SpO2 100%   BMI 56.45 kg/m   Physical Exam Vitals and nursing note reviewed. Exam conducted with a chaperone present.  Constitutional:      General: She is not in acute distress.    Appearance: She is obese. She is not ill-appearing or toxic-appearing.  Pulmonary:     Effort: Pulmonary effort is normal.  Genitourinary:    General: Normal vulva.     Exam position: Lithotomy position.     Labia:        Right: No rash.        Left: No rash.      Vagina: Vaginal discharge (exccessive thin white mucoid discharge) present. No lesions.     Cervix: No lesion, erythema or cervical bleeding.  Skin:    Capillary Refill: Capillary refill takes less than 2 seconds.  Neurological:     Mental Status: She is alert. Mental status is at baseline.  Psychiatric:        Mood and Affect: Mood normal.        Behavior: Behavior normal.    ASSESSMENT/PLAN:   Elevated blood pressure reading Elevated blood pressure reading today but is asymptomatic.  Intermittent elevated readings in the past.  Recommend rechecking at follow up and consider treatment after discussion with patient.  Screening examination for STD (sexually transmitted disease) Collected wet prep, GC/chlamydia, RPR, HIV today Positive for BV, flagyl sent to pharmacy and patient informed.      Leeroy Bock, DO [facility]

## 2020-09-20 LAB — RPR: RPR Ser Ql: NONREACTIVE

## 2020-09-20 LAB — HIV ANTIBODY (ROUTINE TESTING W REFLEX): HIV Screen 4th Generation wRfx: NONREACTIVE

## 2020-09-21 LAB — CERVICOVAGINAL ANCILLARY ONLY
Chlamydia: NEGATIVE
Comment: NEGATIVE
Comment: NORMAL
Neisseria Gonorrhea: NEGATIVE

## 2020-11-10 ENCOUNTER — Emergency Department (HOSPITAL_COMMUNITY)
Admission: EM | Admit: 2020-11-10 | Discharge: 2020-11-10 | Disposition: A | Discharge status: Home / Self Care | Payer: Medicaid Other | Attending: Emergency Medicine | Admitting: Emergency Medicine

## 2020-11-10 ENCOUNTER — Encounter (HOSPITAL_COMMUNITY): Payer: Self-pay

## 2020-11-10 ENCOUNTER — Other Ambulatory Visit: Payer: Self-pay

## 2020-11-10 ENCOUNTER — Ambulatory Visit
Admission: EM | Admit: 2020-11-10 | Discharge: 2020-11-10 | Disposition: A | Discharge status: Home / Self Care | Payer: Medicaid Other | Attending: Emergency Medicine | Admitting: Emergency Medicine

## 2020-11-10 DIAGNOSIS — Z5321 Procedure and treatment not carried out due to patient leaving prior to being seen by health care provider: Secondary | ICD-10-CM | POA: Insufficient documentation

## 2020-11-10 DIAGNOSIS — N7689 Other specified inflammation of vagina and vulva: Secondary | ICD-10-CM | POA: Insufficient documentation

## 2020-11-10 DIAGNOSIS — N898 Other specified noninflammatory disorders of vagina: Secondary | ICD-10-CM

## 2020-11-10 LAB — POCT URINALYSIS DIP (MANUAL ENTRY)
Glucose, UA: NEGATIVE mg/dL
Nitrite, UA: NEGATIVE
Spec Grav, UA: >=1.03 — AB (ref 1.010–1.025)
Urobilinogen, UA: 0.2 E.U./dL
pH, UA: 5.5 (ref 5.0–8.0)

## 2020-11-10 LAB — POCT URINE PREGNANCY: Preg Test, Ur: NEGATIVE

## 2020-11-10 MED ORDER — FLUCONAZOLE 200 MG PO TABS: ORAL_TABLET | ORAL | 0 refills | Status: DC

## 2020-11-10 NOTE — ED Notes (Addendum)
Still unable to find patient. Patient assumed to have eloped.

## 2020-11-10 NOTE — ED Notes (Addendum)
Upon entering room to round on patient and reassess vitals, patient noted to not be in the bed. Gown noted to be removed and placed on the bed. Unsure if patient has eloped.

## 2020-11-10 NOTE — ED Triage Notes (Signed)
Four day h/o vaginal irritation, itching and discharge. Confirms dysuria, urinary urgency and frequency. She denies hematuria, abdominal pain and vaginal odor.

## 2020-11-10 NOTE — ED Triage Notes (Signed)
Pt to ED from home with c/o vaginal irritation and "white clumpy discharge" which began 3 days ago. Pt denies any new partners.Pt has  Hx of yeast infections.

## 2020-11-10 NOTE — ED Provider Notes (Signed)
[facility]   [id number] 11/10/20 Arrival Time: 1102   [id number] DISCHARGE  SUBJECTIVE:  Sara Taylor is a 30 y.o. female who presents with complaints of thick vaginal white discharge x 4 days.  Symptoms began after using new soap.  Denies alleviating or aggravating factors.  She reports similar symptoms in the past with yeast infection.  Complains of associated vaginal irritation, itching, dysuria, urinary urgency, and frequency.  She denies fever, chills, nausea, vomiting, abdominal or pelvic pain, vaginal bleeding, dyspareunia, vaginal rashes or lesions.   No LMP recorded.  ROS: As per HPI.  All other pertinent ROS negative.     Past Medical History:  Diagnosis Date   Anxiety    Asthma    Depression    Hypertension    Obesity    Ovarian cyst    History reviewed. No pertinent surgical history. Allergies  Allergen Reactions   Doxycycline Hives   No current facility-administered medications on file prior to encounter.   Current Outpatient Medications on File Prior to Encounter  Medication Sig Dispense Refill   hydrochlorothiazide (HYDRODIURIL) 25 MG tablet Take 1 tablet (25 mg total) by mouth daily. Must have office visit for refills 30 tablet 12   [DISCONTINUED] FLUoxetine (PROZAC) 20 MG capsule Take 20 mg by mouth daily.      Social History   Socioeconomic History   Marital status: Single    Spouse name: Not on file   Number of children: Not on file   Years of education: Not on file   Highest education level: Not on file  Occupational History   Not on file  Tobacco Use   Smoking status: Every Day    Packs/day: 0.20    Pack years: 0.00    Types: Cigarettes    Start date: 08/08/2015    Last attempt to quit: 09/2015    Years since quitting: 5.1   Smokeless tobacco: Former    Quit date: 08/08/2015  Vaping Use   Vaping Use: Never used  Substance and Sexual Activity   Alcohol use: Yes    Alcohol/week: 0.0 standard drinks    Comment: occ   Drug  use: No   Sexual activity: Yes    Birth control/protection: None  Other Topics Concern   Not on file  Social History Narrative   Not on file   Social Determinants of Health   Financial Resource Strain: Not on file  Food Insecurity: Not on file  Transportation Needs: Not on file  Physical Activity: Not on file  Stress: Not on file  Social Connections: Not on file  Intimate Partner Violence: Not on file   Family History  Problem Relation Age of Onset   Hypertension Maternal Grandmother    Hypertension Maternal Grandfather    Hypertension Paternal Grandmother    Diabetes Paternal Grandmother    Hypertension Mother    Diabetes Mother    Hypothyroidism Mother     OBJECTIVE:  Vitals:   11/10/20 1219  BP: (!) 154/106  Pulse: (!) 105  Resp: 18  Temp: 98.6 F (37 C)  TempSrc: Oral  SpO2: 96%     General appearance: Alert, NAD, appears stated age Head: NCAT Throat: lips, mucosa, and tongue normal; teeth and gums normal Lungs: CTA bilaterally without adventitious breath sounds Heart: regular rate and rhythm.   Back: no CVA tenderness Abdomen: soft, non-tender; bowel sounds normal; no guarding GU: deferred Skin: warm and dry Psychological:  Alert and cooperative. Normal mood and affect.  LABS:  Results for orders placed or performed during the hospital encounter of 11/10/20  POCT urinalysis dipstick  Result Value Ref Range   Color, UA yellow yellow   Clarity, UA cloudy (A) clear   Glucose, UA negative negative mg/dL   Bilirubin, UA small (A) negative   Ketones, POC UA trace (5) (A) negative mg/dL   Spec Grav, UA >=0.981 (A) 1.010 - 1.025   Blood, UA moderate (A) negative   pH, UA 5.5 5.0 - 8.0   Protein Ur, POC trace (A) negative mg/dL   Urobilinogen, UA 0.2 0.2 or 1.0 E.U./dL   Nitrite, UA Negative Negative   Leukocytes, UA Small (1+) (A) Negative  POCT urine pregnancy  Result Value Ref Range   Preg Test, Ur Negative Negative    Labs Reviewed  POCT  URINALYSIS DIP (MANUAL ENTRY) - Abnormal; Notable for the following components:      Result Value   Clarity, UA cloudy (*)    Bilirubin, UA small (*)    Ketones, POC UA trace (5) (*)    Spec Grav, UA >=1.030 (*)    Blood, UA moderate (*)    Protein Ur, POC trace (*)    Leukocytes, UA Small (1+) (*)    All other components within normal limits  URINE CULTURE  POCT URINE PREGNANCY  CERVICOVAGINAL ANCILLARY ONLY    ASSESSMENT & PLAN:  1. Vaginal discharge     Meds ordered this encounter  Medications   fluconazole (DIFLUCAN) 200 MG tablet    Sig: Take one dose by mouth, wait 72 hours, and then take second dose by mouth    Dispense:  2 tablet    Refill:  0    Order Specific Question:   Supervising Provider    Answer:   Eustace Moore [[id number]]    Pending: Labs Reviewed  POCT URINALYSIS DIP (MANUAL ENTRY) - Abnormal; Notable for the following components:      Result Value   Clarity, UA cloudy (*)    Bilirubin, UA small (*)    Ketones, POC UA trace (5) (*)    Spec Grav, UA >=1.030 (*)    Blood, UA moderate (*)    Protein Ur, POC trace (*)    Leukocytes, UA Small (1+) (*)    All other components within normal limits  URINE CULTURE  POCT URINE PREGNANCY  CERVICOVAGINAL ANCILLARY ONLY   Urine culture sent Vaginal self-swab obtained.  We will follow up with you regarding abnormal results Prescribed diflucan 200 mg once daily and then second dose 72 hours later Take medications as prescribed and to completion If tests results are positive, please abstain from sexual activity until you and your partner(s) have been treated Follow up with PCP  Return here or go to ER if you have any new or worsening symptoms fever, chills, nausea, vomiting, abdominal or pelvic pain, painful intercourse, vaginal discharge, vaginal bleeding, persistent symptoms despite treatment, etc...  Reviewed expectations re: course of current medical issues. Questions answered. Outlined signs and  symptoms indicating need for more acute intervention. Patient verbalized understanding. After Visit Summary given.        Rennis Harding, PA-C 11/10/20 1249

## 2020-11-10 NOTE — Discharge Instructions (Addendum)
Urine culture sent Vaginal self-swab obtained.  We will follow up with you regarding abnormal results Prescribed diflucan 200 mg once daily and then second dose 72 hours later Take medications as prescribed and to completion If tests results are positive, please abstain from sexual activity until you and your partner(s) have been treated Follow up with PCP  Return here or go to ER if you have any new or worsening symptoms fever, chills, nausea, vomiting, abdominal or pelvic pain, painful intercourse, vaginal discharge, vaginal bleeding, persistent symptoms despite treatment, etc..Marland Kitchen

## 2020-11-12 LAB — URINE CULTURE

## 2020-11-14 ENCOUNTER — Telehealth (HOSPITAL_COMMUNITY): Payer: Self-pay | Admitting: Emergency Medicine

## 2020-11-14 LAB — CERVICOVAGINAL ANCILLARY ONLY
Bacterial Vaginitis (gardnerella): POSITIVE — AB
Candida Glabrata: NEGATIVE
Candida Vaginitis: POSITIVE — AB
Chlamydia: NEGATIVE
Comment: NEGATIVE
Comment: NEGATIVE
Comment: NEGATIVE
Comment: NEGATIVE
Comment: NEGATIVE
Comment: NORMAL
Neisseria Gonorrhea: NEGATIVE
Trichomonas: NEGATIVE

## 2020-11-14 MED ORDER — METRONIDAZOLE 500 MG PO TABS: 500.0000 mg | ORAL_TABLET | Freq: Two times a day (BID) | ORAL | 0 refills | Status: DC

## 2021-01-14 ENCOUNTER — Ambulatory Visit
Admission: EM | Admit: 2021-01-14 | Discharge: 2021-01-14 | Disposition: A | Discharge status: Home / Self Care | Payer: Medicaid Other | Attending: Internal Medicine | Admitting: Internal Medicine

## 2021-01-14 ENCOUNTER — Other Ambulatory Visit: Payer: Self-pay

## 2021-01-14 DIAGNOSIS — N760 Acute vaginitis: Secondary | ICD-10-CM | POA: Insufficient documentation

## 2021-01-14 DIAGNOSIS — B9689 Other specified bacterial agents as the cause of diseases classified elsewhere: Secondary | ICD-10-CM

## 2021-01-14 LAB — POCT URINALYSIS DIP (MANUAL ENTRY)
Blood, UA: NEGATIVE
Glucose, UA: NEGATIVE mg/dL
Ketones, POC UA: NEGATIVE mg/dL
Leukocytes, UA: NEGATIVE
Nitrite, UA: NEGATIVE
Protein Ur, POC: 30 mg/dL — AB
Spec Grav, UA: >=1.03 — AB (ref 1.010–1.025)
Urobilinogen, UA: 0.2 E.U./dL
pH, UA: 5.5 (ref 5.0–8.0)

## 2021-01-14 LAB — POCT URINE PREGNANCY: Preg Test, Ur: NEGATIVE

## 2021-01-14 MED ORDER — METRONIDAZOLE 500 MG PO TABS
500.0000 mg | ORAL_TABLET | Freq: Two times a day (BID) | ORAL | 0 refills | Status: DC
Start: 2021-01-14 — End: 2021-05-04

## 2021-01-14 NOTE — ED Provider Notes (Signed)
[facility]    CSN: [id number] Arrival date & time: 01/14/21  0815      History   Chief Complaint Chief Complaint  Patient presents with   Abdominal Pain    HPI Sara Taylor is a 30 y.o. female comes to the urgent care with 2 to 3-day history of lower abdominal pain and vaginal odor.  Patient says symptoms started 2 to 3 days ago.  She denies any vaginal discharge.  No dysuria, urgency or frequency.  Patient denies douching.  No bowel problems.  No dyspareunia.  No fever or chills.  No itching in the vaginal area.  Patient is sexually active with 1 partner.  She engages in unprotected sexual intercourse.  Last menstrual period was 3 weeks ago.Marland Kitchen   HPI  Past Medical History:  Diagnosis Date   Anxiety    Asthma    Depression    Hypertension    Obesity    Ovarian cyst     Patient Active Problem List   Diagnosis Date Noted   Elevated blood pressure reading 09/19/2020   Cervical cancer screening 08/01/2020   Trichomonosis 08/01/2020   Abscess of skin of neck 02/28/2020   Epidermoid cyst of neck 07/14/2019   Acanthosis nigricans 07/14/2019   Asthma 01/03/2016   Vaginal discharge 03/02/2014   Preventative health care 06/02/2013   Screening examination for STD (sexually transmitted disease) 06/02/2013   Encounter for long-term (current) use of medications 05/26/2012   History of chlamydia 05/26/2012   TOBACCO USE 06/09/2008   OBESITY, NOS 07/10/2006   HYPERTENSION, BENIGN SYSTEMIC 07/10/2006   RHINITIS, ALLERGIC 07/10/2006    History reviewed. No pertinent surgical history.  OB History   No obstetric history on file.      Home Medications    Prior to Admission medications   Medication Sig Start Date End Date Taking? Authorizing Provider  hydrochlorothiazide (HYDRODIURIL) 25 MG tablet Take 1 tablet (25 mg total) by mouth daily. Must have office visit for refills 02/25/20   Peggyann Shoals C, DO  metroNIDAZOLE (FLAGYL) 500 MG tablet Take 1 tablet  (500 mg total) by mouth 2 (two) times daily. 01/14/21   Merrilee Jansky, MD  FLUoxetine (PROZAC) 20 MG capsule Take 20 mg by mouth daily.  02/25/20  [provider]    Family History Family History  Problem Relation Age of Onset   Hypertension Maternal Grandmother    Hypertension Maternal Grandfather    Hypertension Paternal Grandmother    Diabetes Paternal Grandmother    Hypertension Mother    Diabetes Mother    Hypothyroidism Mother     Social History Social History   Tobacco Use   Smoking status: Every Day    Packs/day: 0.20    Types: Cigarettes    Start date: 08/08/2015    Last attempt to quit: 09/2015    Years since quitting: 5.3   Smokeless tobacco: Former    Quit date: 08/08/2015  Vaping Use   Vaping Use: Never used  Substance Use Topics   Alcohol use: Yes    Alcohol/week: 0.0 standard drinks    Comment: occ   Drug use: No     Allergies   Doxycycline   Review of Systems Review of Systems  Constitutional: Negative.   Respiratory: Negative.    Gastrointestinal:  Positive for abdominal pain. Negative for diarrhea and nausea.  Genitourinary:  Negative for dysuria, genital sores, urgency, vaginal bleeding, vaginal discharge and vaginal pain.    Physical Exam Triage Vital  Signs ED Triage Vitals  Enc Vitals Group     BP 01/14/21 0828 (!) 152/110     Pulse Rate 01/14/21 0828 78     Resp 01/14/21 0828 18     Temp 01/14/21 0828 97.8 F (36.6 C)     Temp src --      SpO2 01/14/21 0828 95 %     Weight --      Height --      Head Circumference --      Peak Flow --      Pain Score 01/14/21 0826 8     Pain Loc --      Pain Edu? --      Excl. in GC? --    No data found.  Updated Vital Signs BP (!) 152/110 Comment: has not taken bp med this morning  Pulse 78   Temp 97.8 F (36.6 C)   Resp 18   LMP 12/11/2020   SpO2 95%   Visual Acuity Right Eye Distance:   Left Eye Distance:   Bilateral Distance:    Right Eye Near:   Left Eye Near:     Bilateral Near:     Physical Exam Vitals and nursing note reviewed.  Constitutional:      General: She is not in acute distress.    Appearance: She is well-developed. She is not ill-appearing.  Cardiovascular:     Rate and Rhythm: Normal rate and regular rhythm.  Pulmonary:     Effort: Pulmonary effort is normal.     Breath sounds: Normal breath sounds.  Abdominal:     General: Bowel sounds are normal.     Palpations: Abdomen is soft. There is no hepatomegaly or splenomegaly.     Tenderness: There is abdominal tenderness in the suprapubic area.  Neurological:     Mental Status: She is alert.     UC Treatments / Results  Labs (all labs ordered are listed, but only abnormal results are displayed) Labs Reviewed  POCT URINALYSIS DIP (MANUAL ENTRY) - Abnormal; Notable for the following components:      Result Value   Clarity, UA cloudy (*)    Bilirubin, UA small (*)    Spec Grav, UA >=1.030 (*)    Protein Ur, POC =30 (*)    All other components within normal limits  POCT URINE PREGNANCY  CERVICOVAGINAL ANCILLARY ONLY    EKG   Radiology No results found.  Procedures Procedures (including critical care time)  Medications Ordered in UC Medications - No data to display  Initial Impression / Assessment and Plan / UC Course  I have reviewed the triage vital signs and the nursing notes.  Pertinent labs & imaging results that were available during my care of the patient were reviewed by me and considered in my medical decision making (see chart for details).     1.  Bacterial vaginosis: Point-of-care urinalysis is negative for urinary tract infection Patient is encouraged to increase oral fluid intake Cervical vaginal swab for GC/chlamydia/trichomonas/bacterial vaginosis/vaginal yeast Flagyl 500 mg twice daily for 5 days. Patient requested STD screening. Return to urgent care if symptoms worsen We will call patient with recommendations if labs are abnormal. Final  Clinical Impressions(s) / UC Diagnoses   Final diagnoses:  Bacterial vaginosis     Discharge Instructions      Please take medications as prescribed Avoid taking alcohol with metronidazole We will call you with recommendations if labs are abnormal.   ED Prescriptions  Medication Sig Dispense Auth. Provider   metroNIDAZOLE (FLAGYL) 500 MG tablet Take 1 tablet (500 mg total) by mouth 2 (two) times daily. 14 tablet Jaxan Michel, Britta Mccreedy, MD      PDMP not reviewed this encounter.   Merrilee Jansky, MD 01/14/21 [phone]

## 2021-01-14 NOTE — Discharge Instructions (Addendum)
Please take medications as prescribed Avoid taking alcohol with metronidazole We will call you with recommendations if labs are abnormal.

## 2021-01-14 NOTE — ED Triage Notes (Signed)
Pt presents with lower abdominal and back pain and odorous urine for pst 2-3 days

## 2021-01-16 LAB — CERVICOVAGINAL ANCILLARY ONLY
Bacterial Vaginitis (gardnerella): POSITIVE — AB
Candida Glabrata: NEGATIVE
Candida Vaginitis: NEGATIVE
Chlamydia: NEGATIVE
Comment: NEGATIVE
Comment: NEGATIVE
Comment: NEGATIVE
Comment: NEGATIVE
Comment: NEGATIVE
Comment: NORMAL
Neisseria Gonorrhea: NEGATIVE
Trichomonas: NEGATIVE

## 2021-05-02 ENCOUNTER — Ambulatory Visit (HOSPITAL_COMMUNITY)
Admission: EM | Admit: 2021-05-02 | Discharge: 2021-05-02 | Disposition: A | Discharge status: Home / Self Care | Payer: Medicaid Other | Attending: Family Medicine | Admitting: Family Medicine

## 2021-05-02 ENCOUNTER — Encounter (HOSPITAL_COMMUNITY): Payer: Self-pay | Admitting: Emergency Medicine

## 2021-05-02 ENCOUNTER — Other Ambulatory Visit: Payer: Self-pay

## 2021-05-02 DIAGNOSIS — R109 Unspecified abdominal pain: Secondary | ICD-10-CM | POA: Insufficient documentation

## 2021-05-02 DIAGNOSIS — N760 Acute vaginitis: Secondary | ICD-10-CM

## 2021-05-02 DIAGNOSIS — I10 Essential (primary) hypertension: Secondary | ICD-10-CM | POA: Insufficient documentation

## 2021-05-02 LAB — POCT URINALYSIS DIPSTICK, ED / UC
Bilirubin Urine: NEGATIVE
Glucose, UA: NEGATIVE mg/dL
Ketones, ur: NEGATIVE mg/dL
Leukocytes,Ua: NEGATIVE
Nitrite: NEGATIVE
Protein, ur: NEGATIVE mg/dL
Specific Gravity, Urine: 1.02 (ref 1.005–1.030)
Urobilinogen, UA: 0.2 mg/dL (ref 0.0–1.0)
pH: 7.5 (ref 5.0–8.0)

## 2021-05-02 LAB — POC URINE PREG, ED: Preg Test, Ur: NEGATIVE

## 2021-05-02 MED ORDER — FLUCONAZOLE 150 MG PO TABS: 150.0000 mg | ORAL_TABLET | Freq: Once | ORAL | 0 refills | Status: AC

## 2021-05-02 MED ORDER — AMLODIPINE BESYLATE 5 MG PO TABS: 5.0000 mg | ORAL_TABLET | Freq: Every day | ORAL | 0 refills | Status: DC

## 2021-05-02 NOTE — ED Notes (Signed)
Called pt in lobby. No response.

## 2021-05-02 NOTE — ED Provider Notes (Addendum)
[facility]    CSN: [id number] Arrival date & time: 05/02/21  1619      History   Chief Complaint Chief Complaint  Patient presents with   Abdominal Pain    HPI Sara Taylor is a 30 y.o. female.   HPI Patient presents today with abnormal vaginal odor and left flank pain. Reports an abnormal sensation with urination. Concern for possible UTI. No fever or chills.  Patients blood pressure is elevated on arrival. She suffers from hypertension and is currently taking HCTZ 25 mg and report compliance with medication.  Denies CP, SOB, or swelling. Past Medical History:  Diagnosis Date   Anxiety    Asthma    Depression    Hypertension    Obesity    Ovarian cyst     Patient Active Problem List   Diagnosis Date Noted   Elevated blood pressure reading 09/19/2020   Cervical cancer screening 08/01/2020   Trichomonosis 08/01/2020   Abscess of skin of neck 02/28/2020   Epidermoid cyst of neck 07/14/2019   Acanthosis nigricans 07/14/2019   Asthma 01/03/2016   Vaginal discharge 03/02/2014   Preventative health care 06/02/2013   Screening examination for STD (sexually transmitted disease) 06/02/2013   Encounter for long-term (current) use of medications 05/26/2012   History of chlamydia 05/26/2012   TOBACCO USE 06/09/2008   OBESITY, NOS 07/10/2006   HYPERTENSION, BENIGN SYSTEMIC 07/10/2006   RHINITIS, ALLERGIC 07/10/2006    History reviewed. No pertinent surgical history.  OB History   No obstetric history on file.      Home Medications    Prior to Admission medications   Medication Sig Start Date End Date Taking? Authorizing Provider  amLODipine (NORVASC) 5 MG tablet Take 1 tablet (5 mg total) by mouth daily. 05/02/21  Yes Bing Neighbors, FNP  hydrochlorothiazide (HYDRODIURIL) 25 MG tablet Take 1 tablet (25 mg total) by mouth daily. Must have office visit for refills 02/25/20   Peggyann Shoals C, DO  metroNIDAZOLE (FLAGYL) 500 MG tablet Take 1  tablet (500 mg total) by mouth 2 (two) times daily. 05/04/21   Merrilee Jansky, MD  FLUoxetine (PROZAC) 20 MG capsule Take 20 mg by mouth daily.  02/25/20  [provider]    Family History Family History  Problem Relation Age of Onset   Hypertension Maternal Grandmother    Hypertension Maternal Grandfather    Hypertension Paternal Grandmother    Diabetes Paternal Grandmother    Hypertension Mother    Diabetes Mother    Hypothyroidism Mother     Social History Social History   Tobacco Use   Smoking status: Every Day    Packs/day: 0.20    Types: Cigarettes    Start date: 08/08/2015    Last attempt to quit: 09/2015    Years since quitting: 5.6   Smokeless tobacco: Former    Quit date: 08/08/2015  Vaping Use   Vaping Use: Never used  Substance Use Topics   Alcohol use: Yes    Alcohol/week: 0.0 standard drinks    Comment: occ   Drug use: No     Allergies   Doxycycline   Review of Systems Review of Systems Pertinent negatives listed in HPI  Physical Exam Triage Vital Signs ED Triage Vitals  Enc Vitals Group     BP 05/02/21 1829 (!) 188/138     Pulse Rate 05/02/21 1829 78     Resp 05/02/21 1829 18     Temp 05/02/21 1829 98.9 F (  37.2 C)     Temp Source 05/02/21 1829 Oral     SpO2 05/02/21 1829 98 %     Weight --      Height --      Head Circumference --      Peak Flow --      Pain Score 05/02/21 1828 6     Pain Loc --      Pain Edu? --      Excl. in GC? --    No data found.  Updated Vital Signs BP (!) 188/138 (BP Location: Left Arm)    Pulse 78    Temp 98.9 F (37.2 C) (Oral)    Resp 18    SpO2 98%   Visual Acuity Right Eye Distance:   Left Eye Distance:   Bilateral Distance:    Right Eye Near:   Left Eye Near:    Bilateral Near:     Physical Exam Constitutional:      Appearance: She is obese.  HENT:     Head: Normocephalic and atraumatic.  Eyes:     Extraocular Movements: Extraocular movements intact.     Pupils: Pupils are  equal, round, and reactive to light.  Abdominal:     General: Bowel sounds are normal.     Tenderness: There is no abdominal tenderness. There is no left CVA tenderness, guarding or rebound.  Skin:    General: Skin is warm.     Capillary Refill: Capillary refill takes less than 2 seconds.  Neurological:     Mental Status: She is alert.  Psychiatric:        Attention and Perception: Attention normal.        Mood and Affect: Mood normal.        Speech: Speech normal.        Cognition and Memory: Cognition normal.     UC Treatments / Results  Labs (all labs ordered are listed, but only abnormal results are displayed) Labs Reviewed  URINE CULTURE - Abnormal; Notable for the following components:      Result Value   Culture   (*)    Value: <10,000 COLONIES/mL INSIGNIFICANT GROWTH Performed at [facility], [address], [place], [place] [address]    All other components within normal limits  POCT URINALYSIS DIPSTICK, ED / UC - Abnormal; Notable for the following components:   Hgb urine dipstick MODERATE (*)    All other components within normal limits  CERVICOVAGINAL ANCILLARY ONLY - Abnormal; Notable for the following components:   Bacterial Vaginitis (gardnerella) Positive (*)    All other components within normal limits  POC URINE PREG, ED    EKG   Radiology No results found.  Procedures Procedures (including critical care time)  Medications Ordered in UC Medications - No data to display  Initial Impression / Assessment and Plan / UC Course  I have reviewed the triage vital signs and the nursing notes.  Pertinent labs & imaging results that were available during my care of the patient were reviewed by me and considered in my medical decision making (see chart for details).    Accelerated hypertension, start Amlodipine and continue HCTZ. Vaginal cytology pending, however treating for vaginitis based on symptoms. UA inconsistent with UTI, however will  culture given the presence of blood in urine. No reproducible tenderness of Left Flank on exam, low suspicion for renal stone. Strict ER precautions given if symptoms worsen or do not readily improve. Final Clinical Impressions(s) /  UC Diagnoses   Final diagnoses:  Essential hypertension  Vaginitis and vulvovaginitis  Left flank pain     Discharge Instructions      Continue taking HCTZ and start Amlodpine daily at bedtime for your blood pressure treatment. Your vaginal cytology result will be available within 1-2 days. Start Diflucan and repeat in 3 days if needed. Your urine does not show an active urinary tract infection.  I will culture your urine to determine if your symptoms are relating to evolving UTI that can't be seen here in clinic. If the abdominal and flank pain worsen and is not relieved with Ibuprofen, recommend evaluation in the emergency department. Urine pregnancy is negative     ED Prescriptions     Medication Sig Dispense Auth. Provider   fluconazole (DIFLUCAN) 150 MG tablet Take 1 tablet (150 mg total) by mouth once for 1 dose. Repeat if needed 2 tablet Bing Neighbors, FNP   amLODipine (NORVASC) 5 MG tablet Take 1 tablet (5 mg total) by mouth daily. 90 tablet Bing Neighbors, FNP      PDMP not reviewed this encounter.   Bing Neighbors, FNP 05/04/21 1711    Bing Neighbors, FNP 05/04/21 [phone]

## 2021-05-02 NOTE — ED Triage Notes (Signed)
Pt reports lower abd pains with tingling with urination since the weekend that will radiate to left flank area. Also having vaginal odor.

## 2021-05-02 NOTE — Discharge Instructions (Addendum)
Continue taking HCTZ and start Amlodpine daily at bedtime for your blood pressure treatment. Your vaginal cytology result will be available within 1-2 days. Start Diflucan and repeat in 3 days if needed. Your urine does not show an active urinary tract infection.  I will culture your urine to determine if your symptoms are relating to evolving UTI that can't be seen here in clinic. If the abdominal and flank pain worsen and is not relieved with Ibuprofen, recommend evaluation in the emergency department. Urine pregnancy is negative

## 2021-05-03 LAB — CERVICOVAGINAL ANCILLARY ONLY
Bacterial Vaginitis (gardnerella): POSITIVE — AB
Candida Glabrata: NEGATIVE
Candida Vaginitis: NEGATIVE
Chlamydia: NEGATIVE
Comment: NEGATIVE
Comment: NEGATIVE
Comment: NEGATIVE
Comment: NEGATIVE
Comment: NEGATIVE
Comment: NORMAL
Neisseria Gonorrhea: NEGATIVE
Trichomonas: NEGATIVE

## 2021-05-03 LAB — URINE CULTURE: Culture: <10000 — AB

## 2021-05-04 ENCOUNTER — Telehealth (HOSPITAL_COMMUNITY): Payer: Self-pay | Admitting: Emergency Medicine

## 2021-05-04 MED ORDER — METRONIDAZOLE 500 MG PO TABS: 500.0000 mg | ORAL_TABLET | Freq: Two times a day (BID) | ORAL | 0 refills | Status: DC

## 2021-05-07 ENCOUNTER — Telehealth (HOSPITAL_COMMUNITY): Payer: Self-pay

## 2021-05-07 MED ORDER — METRONIDAZOLE 500 MG PO TABS: 500.0000 mg | ORAL_TABLET | Freq: Two times a day (BID) | ORAL | 0 refills | Status: DC

## 2021-07-01 ENCOUNTER — Telehealth: Payer: Medicaid Other | Admitting: Family

## 2021-07-01 DIAGNOSIS — N898 Other specified noninflammatory disorders of vagina: Secondary | ICD-10-CM

## 2021-07-01 NOTE — Progress Notes (Signed)
Based on what you shared with me, I feel your condition warrants further evaluation and I recommend that you be seen in a face to face visit.    You need to be seen face to face for further testing to rule out a more serious infection.   NOTE: There will be NO CHARGE for this eVisit   If you are having a true medical emergency please call 911.      For an urgent face to face visit, [facility] has six urgent care centers for your convenience:     [facility] at [facility] Directions [phone] [address] [place], [place] [address]    [facility] Center [facility]) Get Driving Directions [phone] [address] [place], [place] [address]  [facility] Center [facility]) Get Driving Directions [phone] [address] [place],  [place]  [address]  [facility]gent Care at [facility] Get Driving Directions [phone] [address] [place], [place] [address]   [facility] Health Urgent Care at [facility] Get Driving Directions  [phone] [address] [place], [place] [address]   [facility] Health Urgent Care at [facility] Directions [phone] [address] [place], [place] [address]  Your MyChart E-visit questionnaire answers were reviewed by a board certified advanced clinical practitioner to complete your personal care plan based on your specific symptoms.  Thank you for using e-Visits.

## 2021-07-02 NOTE — Patient Instructions (Incomplete)
It was wonderful to see you today.  Please bring ALL of your medications with you to every visit.   Today we talked about:  Your next Pap smear is due in 2027.  -We are checking for sexually transmitted infections including chlamydia, gonorrhea, trichomonas, HIV, syphilis and hepatitis B. I will let you know of the results via MyChart or telephone call. We are also checking for bacterial vaginosis and yeast, which are not sexually transmitted infections. *** -You should abstain from sexual activity until we have the results. If your test is positive for a sexually transmitted infection, it is important that both you and your partner are both treated.  -It is always important to use barrier protection, such as condoms, to help prevent sexually transmitted infections.     Thank you for choosing [facility].   Please call [phone] with any questions about today's appointment.  Please be sure to schedule follow up at the front  desk before you leave today.   Sharion Settler, DO PGY-2 Family Medicine

## 2021-07-02 NOTE — Progress Notes (Deleted)
° ° °  SUBJECTIVE:   CHIEF COMPLAINT / HPI:   Vaginal Discharge Sara Taylor is a 31 y.o. female who presents to the [facility] clinic today for vaginal discharge ***. She was incidentally found to be Trichomonas positive today. She reports *** sexual partners, uses *** for contraception. She reports *** vaginal discharge. Amenable for STI testing today. ***Last Pap was 07/2020 which showed NILM, negative for high-risk HPV. Next Pap smear due 2027.   PERTINENT  PMH / PSH: Trichomonas  OBJECTIVE:   There were no vitals taken for this visit. ***  General: NAD, pleasant, able to participate in exam Respiratory: Breathing comfortably  Abdomen: *** GU: Normal appearance of labia majora and minora, without lesions. Vagina tissue pink, moist, without lesions or abrasions. Cervix normal appearance, non-friable, without discharge from os.  Skin: warm and dry, no rashes noted Psych: Normal affect and mood  GU Exam chaperoned by *** CMA  ASSESSMENT/PLAN:   No problem-specific Assessment & Plan notes found for this encounter.     Sharion Settler, [place]

## 2021-07-06 ENCOUNTER — Ambulatory Visit: Payer: Medicaid Other | Admitting: Family Medicine

## 2021-09-17 NOTE — Progress Notes (Addendum)
SUBJECTIVE:   CHIEF COMPLAINT / HPI:   Vaginal Discharge Sara Taylor is a 31 y.o. female who presents to the clinic today for a Pap smear and STI testing due to vaginal discharge. Her last Pap was 07/2020 which was normal and HPV negative, though she did test positive for trichomonas at that time. She has no history of abnormal Pap smears in the past. She reports white vaginal discharge for the last week with an abnormal odor.  She is sexually active with 1 partner , not using contraception as she is hoping to get pregnant. Amenable to testing for STI.   PERTINENT  PMH / PSH:  Past Medical History:  Diagnosis Date   Anxiety    Asthma    Depression    Hypertension    Obesity    Ovarian cyst     OBJECTIVE:   BP (!) 159/107   Pulse 97   Ht 5' 5" (1.651 m)   Wt (!) 303 lb 6.4 oz (137.6 kg)   LMP 08/21/2021   SpO2 96%   BMI 50.49 kg/m   Vitals:   09/20/21 1336 09/20/21 1351  BP: (!) 158/101 (!) 159/107  Pulse: 97   SpO2: 96%    General: NAD, pleasant, able to participate in exam Respiratory: Breathing comfortably  GU: Normal appearance of labia majora and minora, without lesions. Vagina tissue pink, moist, without lesions or abrasions, white milky discharge present. Cervix normal appearance, non-friable, with white milky discharge from os.  Skin: warm and dry, no rashes noted Psych: Normal affect and mood  ASSESSMENT/PLAN:   Vaginal discharge Discussed with patient recommendation for Pap smear with cotesting every 5 years now that she is over the age of 29. She is currently up to date on Pap smear and there is no indication for repeat Pap at this time, especially given that she has not had any abnormal tests in the past. Given vaginal discharge and her history of recurrent BV and history of trichomonas, will do STI testing. Also opted for blood tests so will check for HIV, RPR and Hep B. Also swabbed throat and anus. -Wet prep +BV; will treat with flagyl. -Will  await other results and treat as indicated  Patient desires pregnancy Counseled to start prenatal vitamins. Rx to pharmacy.  HYPERTENSION, BENIGN SYSTEMIC Reports only taking HCTZ despite Amlodipine also being on med list- she feels that she never picked this up from pharmacy. Asymptomatic. Given that she is desiring pregnancy, will switch her regimen to be safer in pregnancy. -Stop HCTZ -Start labetalol 200 mg twice daily -Lifestyle measures discussed -Rx BP kit -Return in 2 weeks for f/u       , DO [facility]   

## 2021-09-20 ENCOUNTER — Other Ambulatory Visit: Payer: Self-pay

## 2021-09-20 ENCOUNTER — Encounter: Payer: Self-pay | Admitting: Family Medicine

## 2021-09-20 ENCOUNTER — Other Ambulatory Visit (HOSPITAL_COMMUNITY)
Admission: RE | Admit: 2021-09-20 | Discharge: 2021-09-20 | Disposition: A | Discharge status: Home / Self Care | Payer: Medicaid Other | Source: Ambulatory Visit | Attending: Family Medicine | Admitting: Family Medicine

## 2021-09-20 ENCOUNTER — Ambulatory Visit (INDEPENDENT_AMBULATORY_CARE_PROVIDER_SITE_OTHER): Payer: Self-pay | Admitting: Family Medicine

## 2021-09-20 VITALS — BP 159/107 | HR 97 | Ht 65.0 in | Wt 303.4 lb

## 2021-09-20 DIAGNOSIS — I10 Essential (primary) hypertension: Secondary | ICD-10-CM

## 2021-09-20 DIAGNOSIS — N898 Other specified noninflammatory disorders of vagina: Secondary | ICD-10-CM

## 2021-09-20 DIAGNOSIS — Z113 Encounter for screening for infections with a predominantly sexual mode of transmission: Secondary | ICD-10-CM

## 2021-09-20 DIAGNOSIS — Z319 Encounter for procreative management, unspecified: Secondary | ICD-10-CM | POA: Insufficient documentation

## 2021-09-20 LAB — POCT WET PREP (WET MOUNT)
Clue Cells Wet Prep Whiff POC: POSITIVE
Trichomonas Wet Prep HPF POC: ABSENT

## 2021-09-20 MED ORDER — PRENATAL VITAMINS 28-0.8 MG PO TABS: 1.0000 | ORAL_TABLET | Freq: Every day | ORAL | 3 refills | Status: DC

## 2021-09-20 MED ORDER — AMLODIPINE BESYLATE 5 MG PO TABS: 5.0000 mg | ORAL_TABLET | Freq: Every day | ORAL | 0 refills | Status: DC

## 2021-09-20 MED ORDER — LABETALOL HCL 100 MG PO TABS: 200.0000 mg | ORAL_TABLET | Freq: Two times a day (BID) | ORAL | 2 refills | Status: DC

## 2021-09-20 MED ORDER — METRONIDAZOLE 500 MG PO TABS: 500.0000 mg | ORAL_TABLET | Freq: Two times a day (BID) | ORAL | 0 refills | Status: DC

## 2021-09-20 MED ORDER — BLOOD PRESSURE KIT: 1.0000 | PACK | Freq: Two times a day (BID) | 0 refills | Status: DC

## 2021-09-20 NOTE — Patient Instructions (Addendum)
It was wonderful to see you today. ? ?Please bring ALL of your medications with you to every visit.  ? ?Today we talked about: ? ?-We are checking for sexually transmitted infections including chlamydia, gonorrhea, trichomonas, HIV, syphilis and hepatitis B. I will let you know of the results via MyChart or telephone call. We are also checking for bacterial vaginosis and yeast, which are not sexually transmitted infections.  ?-If your test is positive for a sexually transmitted infection, it is important that both you and your partner are both treated.  ?-It is always important to use barrier protection, such as condoms, to help prevent sexually transmitted infections.   ?-I have sent prenatal vitamins to your pharmacy to start taking!  ?-Your blood pressure was elevated today. Given that you are trying to get pregnant I am going to switch you to a regimen that is safer in pregnancy.  Please stop the hydrochlorothiazide and do not pick up the amlodipine.  Instead, start taking labetalol 200 mg twice a day.  Please come back and see me in 2 weeks for blood pressure check. ? ? ?Thank you for choosing [facility].  ? ?Please call [phone] with any questions about today's appointment. ? ?Please be sure to schedule follow up at the front  desk before you leave today.  ? ?Sabino Dick, DO ?PGY-2 Family Medicine   ?

## 2021-09-20 NOTE — Assessment & Plan Note (Addendum)
Discussed with patient recommendation for Pap smear with cotesting every 5 years now that she is over the age of 72. She is currently up to date on Pap smear and there is no indication for repeat Pap at this time, especially given that she has not had any abnormal tests in the past. Given vaginal discharge and her history of recurrent BV and history of trichomonas, will do STI testing. Also opted for blood tests so will check for HIV, RPR and Hep B. Also swabbed throat and anus. ?-Wet prep +BV; will treat with flagyl. ?-Will await other results and treat as indicated ?

## 2021-09-20 NOTE — Assessment & Plan Note (Addendum)
Counseled to start prenatal vitamins. Rx to pharmacy. ?

## 2021-09-20 NOTE — Assessment & Plan Note (Addendum)
Reports only taking HCTZ despite Amlodipine also being on med list- she feels that she never picked this up from pharmacy. Asymptomatic. Given that she is desiring pregnancy, will switch her regimen to be safer in pregnancy. -Stop HCTZ -Start labetalol 200 mg twice daily -Lifestyle measures discussed -Rx BP kit -Return in 2 weeks for f/u

## 2021-09-21 ENCOUNTER — Other Ambulatory Visit: Payer: Self-pay | Admitting: Family Medicine

## 2021-09-21 DIAGNOSIS — A749 Chlamydial infection, unspecified: Secondary | ICD-10-CM

## 2021-09-21 LAB — CERVICOVAGINAL ANCILLARY ONLY
Chlamydia: NEGATIVE
Chlamydia: NEGATIVE
Chlamydia: NEGATIVE
Comment: NEGATIVE
Comment: NORMAL
Comment: NEGATIVE
Comment: NEGATIVE
Comment: NEGATIVE
Comment: NEGATIVE
Comment: NORMAL
Comment: NORMAL
Neisseria Gonorrhea: NEGATIVE
Neisseria Gonorrhea: NEGATIVE
Neisseria Gonorrhea: POSITIVE — AB
Trichomonas: NEGATIVE
Trichomonas: NEGATIVE

## 2021-09-21 LAB — HEPATITIS B SURFACE ANTIGEN: Hepatitis B Surface Ag: NEGATIVE

## 2021-09-21 LAB — HIV ANTIBODY (ROUTINE TESTING W REFLEX): HIV Screen 4th Generation wRfx: NONREACTIVE

## 2021-09-21 LAB — RPR: RPR Ser Ql: NONREACTIVE

## 2021-09-21 MED ORDER — AZITHROMYCIN 500 MG PO TABS: 1000.0000 mg | ORAL_TABLET | Freq: Once | ORAL | 0 refills | Status: AC

## 2021-09-22 ENCOUNTER — Telehealth: Payer: Self-pay | Admitting: Family Medicine

## 2021-09-22 ENCOUNTER — Encounter: Payer: Self-pay | Admitting: Family Medicine

## 2021-09-22 NOTE — Telephone Encounter (Signed)
Attempted to call patient. Re-reviewed labs from yesterday and she was actually positive for gonorrhea, not chlamydia. This will require IM CTX. Since she did not answer, I sent a MyChart message.  ?

## 2021-09-22 NOTE — Telephone Encounter (Signed)
Called patient again and was able to discuss results. Clarified why one swab was positive while the others were negative (given we sampled 3 different areas). She was scheduled for RN visit on Monday at 9AM. Sent over work note via MyChart for her visit with me on 5/11.  ?

## 2021-09-24 ENCOUNTER — Telehealth: Payer: Self-pay | Admitting: Family Medicine

## 2021-09-24 ENCOUNTER — Ambulatory Visit (INDEPENDENT_AMBULATORY_CARE_PROVIDER_SITE_OTHER): Payer: Self-pay

## 2021-09-24 DIAGNOSIS — A549 Gonococcal infection, unspecified: Secondary | ICD-10-CM

## 2021-09-24 MED ORDER — CEFTRIAXONE SODIUM 250 MG IJ SOLR
250.0000 mg | Freq: Once | INTRAMUSCULAR | Status: AC
  Administered 2021-09-24: 250 mg via INTRAMUSCULAR

## 2021-09-24 NOTE — Telephone Encounter (Signed)
Can give 500 mg IM dose of CTX for gonorrhea treatment.  ?

## 2021-09-24 NOTE — Progress Notes (Signed)
Patient in nurse clinic today for STD treatment of Gonorrhea.  ?  ?Patient advised to abstain from sex for 7-10 days after treatment of self and partner.   ? ?Ceftriaxone 250 mg IM x 1 given in [place] and Ceftriaxone 250 mg IM x 1 given in [place]. Patient observed 15 minutes in office.  No reaction noted.  ? ?Patient to follow up in 2-3 months for re-screening.   ? ?Provided condoms and advised to use with all sexual activity. Patient verbalized understanding.  ? ?STD report form fax completed and faxed to [facility] at [phone] (STD department).    ? ? ?  ?

## 2021-09-25 ENCOUNTER — Telehealth: Payer: Self-pay

## 2021-09-25 NOTE — Telephone Encounter (Signed)
-----   Message from Jennette Bill, CMA sent at 09/24/2021  4:14 PM EDT ----- ?Regarding: STI reporting ?Positive for GC ? ?

## 2021-09-25 NOTE — Telephone Encounter (Signed)
Faxed information to [facility].  Attention [employer]. ? ?.Glennie Hawk, CMA ? ?

## 2021-09-28 NOTE — Progress Notes (Deleted)
    SUBJECTIVE:   CHIEF COMPLAINT / HPI:   Blood Pressure F/U KENYADA BLIZARD is a 31 y.o. female who presents to the clinic today for follow up on her blood pressure. Blood pressures elevated at last visit.  She was switched to labetalol 200 mg twice daily given desire for pregnancy. Reports good*** compliance. Home blood pressure readings range *** systolic and *** diastolic. Denies chest pain, shortness of breath, lower extremity edema, headaches, and vision changes.    PERTINENT  PMH / PSH:  Past Medical History:  Diagnosis Date   Anxiety    Asthma    Depression    Hypertension    Obesity    Ovarian cyst     OBJECTIVE:   There were no vitals taken for this visit. ***  General: NAD, pleasant, able to participate in exam Cardiac: RRR, no murmurs. Respiratory: CTAB, normal effort, No wheezes, rales or rhonchi Abdomen: Bowel sounds present, nontender, nondistended, no hepatosplenomegaly. Extremities: no edema or cyanosis. Skin: warm and dry, no rashes noted Neuro: alert, no obvious focal deficits Psych: Normal affect and mood  ASSESSMENT/PLAN:   No problem-specific Assessment & Plan notes found for this encounter.      Sharion Settler, [place]

## 2021-10-04 ENCOUNTER — Ambulatory Visit: Payer: Medicaid Other | Admitting: Family Medicine

## 2021-10-16 ENCOUNTER — Encounter: Payer: Self-pay | Admitting: *Deleted

## 2021-11-01 ENCOUNTER — Other Ambulatory Visit: Payer: Self-pay

## 2021-11-01 ENCOUNTER — Emergency Department (HOSPITAL_COMMUNITY): Payer: Self-pay

## 2021-11-01 ENCOUNTER — Encounter (HOSPITAL_COMMUNITY): Payer: Self-pay

## 2021-11-01 ENCOUNTER — Emergency Department (HOSPITAL_COMMUNITY)
Admission: EM | Admit: 2021-11-01 | Discharge: 2021-11-01 | Disposition: A | Discharge status: Home / Self Care | Payer: Self-pay | Attending: Emergency Medicine | Admitting: Emergency Medicine

## 2021-11-01 DIAGNOSIS — R4182 Altered mental status, unspecified: Secondary | ICD-10-CM | POA: Insufficient documentation

## 2021-11-01 DIAGNOSIS — R944 Abnormal results of kidney function studies: Secondary | ICD-10-CM | POA: Insufficient documentation

## 2021-11-01 DIAGNOSIS — E876 Hypokalemia: Secondary | ICD-10-CM | POA: Insufficient documentation

## 2021-11-01 LAB — COMPREHENSIVE METABOLIC PANEL
ALT: 21 U/L (ref 0–44)
AST: 29 U/L (ref 15–41)
Albumin: 4.2 g/dL (ref 3.5–5.0)
Alkaline Phosphatase: 54 U/L (ref 38–126)
Anion gap: 11 (ref 5–15)
BUN: 11 mg/dL (ref 6–20)
CO2: 22 mmol/L (ref 22–32)
Calcium: 9.4 mg/dL (ref 8.9–10.3)
Chloride: 103 mmol/L (ref 98–111)
Creatinine, Ser: 1.28 mg/dL — ABNORMAL HIGH (ref 0.44–1.00)
GFR, Estimated: 28 mL/min — ABNORMAL LOW (ref >60)
Glucose, Bld: 114 mg/dL — ABNORMAL HIGH (ref 70–99)
Potassium: 3.4 mmol/L — ABNORMAL LOW (ref 3.5–5.1)
Sodium: 136 mmol/L (ref 135–145)
Total Bilirubin: 1 mg/dL (ref 0.3–1.2)
Total Protein: 8.1 g/dL (ref 6.5–8.1)

## 2021-11-01 LAB — CBC WITH DIFFERENTIAL/PLATELET
Abs Immature Granulocytes: 0.02 10*3/uL (ref 0.00–0.07)
Basophils Absolute: 0 10*3/uL (ref 0.0–0.1)
Basophils Relative: 0 %
Eosinophils Absolute: 0 10*3/uL (ref 0.0–0.5)
Eosinophils Relative: 1 %
HCT: 39.6 % (ref 36.0–46.0)
Hemoglobin: 13.7 g/dL (ref 12.0–15.0)
Immature Granulocytes: 0 %
Lymphocytes Relative: 30 %
Lymphs Abs: 2 10*3/uL (ref 0.7–4.0)
MCH: 31.4 pg (ref 26.0–34.0)
MCHC: 34.6 g/dL (ref 30.0–36.0)
MCV: 90.6 fL (ref 80.0–100.0)
Monocytes Absolute: 0.4 10*3/uL (ref 0.1–1.0)
Monocytes Relative: 6 %
Neutro Abs: 4.2 10*3/uL (ref 1.7–7.7)
Neutrophils Relative %: 63 %
Platelets: UNDETERMINED 10*3/uL (ref 150–400)
RBC: 4.37 MIL/uL (ref 3.87–5.11)
RDW: 12.4 % (ref 11.5–15.5)
WBC: 6.7 10*3/uL (ref 4.0–10.5)
nRBC: 0 % (ref 0.0–0.2)

## 2021-11-01 LAB — CBG MONITORING, ED: Glucose-Capillary: 121 mg/dL — ABNORMAL HIGH (ref 70–99)

## 2021-11-01 LAB — ACETAMINOPHEN LEVEL: Acetaminophen (Tylenol), Serum: <10 ug/mL — ABNORMAL LOW (ref 10–30)

## 2021-11-01 LAB — ETHANOL: Alcohol, Ethyl (B): <10 mg/dL (ref <10)

## 2021-11-01 LAB — I-STAT BETA HCG BLOOD, ED (MC, WL, AP ONLY): I-stat hCG, quantitative: <5 m[IU]/mL (ref <5)

## 2021-11-01 LAB — SALICYLATE LEVEL: Salicylate Lvl: <7 mg/dL — ABNORMAL LOW (ref 7.0–30.0)

## 2021-11-01 MED ORDER — NALOXONE HCL 4 MG/0.1ML NA LIQD
NASAL | Status: AC
  Filled 2021-11-01: qty 4

## 2021-11-02 ENCOUNTER — Encounter: Payer: Self-pay | Admitting: Family Medicine

## 2021-11-02 ENCOUNTER — Emergency Department (HOSPITAL_COMMUNITY)
Admission: EM | Admit: 2021-11-02 | Discharge: 2021-11-02 | Disposition: A | Discharge status: Home / Self Care | Payer: Self-pay | Attending: Emergency Medicine | Admitting: Emergency Medicine

## 2021-11-02 ENCOUNTER — Emergency Department (HOSPITAL_COMMUNITY)
Admission: EM | Admit: 2021-11-02 | Discharge: 2021-11-05 | Disposition: A | Discharge status: Home / Self Care | Payer: Self-pay | Attending: Emergency Medicine | Admitting: Emergency Medicine

## 2021-11-02 DIAGNOSIS — F1910 Other psychoactive substance abuse, uncomplicated: Secondary | ICD-10-CM | POA: Insufficient documentation

## 2021-11-02 DIAGNOSIS — R4182 Altered mental status, unspecified: Secondary | ICD-10-CM | POA: Insufficient documentation

## 2021-11-02 DIAGNOSIS — R456 Violent behavior: Secondary | ICD-10-CM | POA: Insufficient documentation

## 2021-11-02 DIAGNOSIS — F29 Unspecified psychosis not due to a substance or known physiological condition: Secondary | ICD-10-CM | POA: Insufficient documentation

## 2021-11-02 DIAGNOSIS — F1999 Other psychoactive substance use, unspecified with unspecified psychoactive substance-induced disorder: Secondary | ICD-10-CM | POA: Insufficient documentation

## 2021-11-02 DIAGNOSIS — Z79899 Other long term (current) drug therapy: Secondary | ICD-10-CM | POA: Insufficient documentation

## 2021-11-02 DIAGNOSIS — F1990 Other psychoactive substance use, unspecified, uncomplicated: Secondary | ICD-10-CM | POA: Diagnosis present

## 2021-11-02 DIAGNOSIS — Z5900 Homelessness unspecified: Secondary | ICD-10-CM | POA: Insufficient documentation

## 2021-11-02 DIAGNOSIS — Z0279 Encounter for issue of other medical certificate: Secondary | ICD-10-CM | POA: Insufficient documentation

## 2021-11-02 LAB — CBC WITH DIFFERENTIAL/PLATELET
Abs Immature Granulocytes: 0.02 10*3/uL (ref 0.00–0.07)
Basophils Absolute: 0 10*3/uL (ref 0.0–0.1)
Basophils Relative: 0 %
Eosinophils Absolute: 0 10*3/uL (ref 0.0–0.5)
Eosinophils Relative: 0 %
HCT: 37 % (ref 36.0–46.0)
Hemoglobin: 13 g/dL (ref 12.0–15.0)
Immature Granulocytes: 0 %
Lymphocytes Relative: 33 %
Lymphs Abs: 2.4 10*3/uL (ref 0.7–4.0)
MCH: 31.5 pg (ref 26.0–34.0)
MCHC: 35.1 g/dL (ref 30.0–36.0)
MCV: 89.6 fL (ref 80.0–100.0)
Monocytes Absolute: 0.5 10*3/uL (ref 0.1–1.0)
Monocytes Relative: 7 %
Neutro Abs: 4.5 10*3/uL (ref 1.7–7.7)
Neutrophils Relative %: 60 %
Platelets: 226 10*3/uL (ref 150–400)
RBC: 4.13 MIL/uL (ref 3.87–5.11)
RDW: 12.3 % (ref 11.5–15.5)
WBC: 7.4 10*3/uL (ref 4.0–10.5)
nRBC: 0 % (ref 0.0–0.2)

## 2021-11-02 LAB — BASIC METABOLIC PANEL
Anion gap: 10 (ref 5–15)
BUN: 10 mg/dL (ref 6–20)
CO2: 24 mmol/L (ref 22–32)
Calcium: 9.1 mg/dL (ref 8.9–10.3)
Chloride: 103 mmol/L (ref 98–111)
Creatinine, Ser: 0.95 mg/dL (ref 0.44–1.00)
GFR, Estimated: >60 mL/min (ref >60)
Glucose, Bld: 104 mg/dL — ABNORMAL HIGH (ref 70–99)
Potassium: 3 mmol/L — ABNORMAL LOW (ref 3.5–5.1)
Sodium: 137 mmol/L (ref 135–145)

## 2021-11-02 LAB — ACETAMINOPHEN LEVEL: Acetaminophen (Tylenol), Serum: <10 ug/mL — ABNORMAL LOW (ref 10–30)

## 2021-11-02 LAB — SALICYLATE LEVEL: Salicylate Lvl: <7 mg/dL — ABNORMAL LOW (ref 7.0–30.0)

## 2021-11-02 LAB — I-STAT BETA HCG BLOOD, ED (MC, WL, AP ONLY): I-stat hCG, quantitative: <5 m[IU]/mL (ref <5)

## 2021-11-02 LAB — ETHANOL: Alcohol, Ethyl (B): <10 mg/dL (ref <10)

## 2021-11-02 MED ORDER — LABETALOL HCL 200 MG PO TABS
200.0000 mg | ORAL_TABLET | Freq: Two times a day (BID) | ORAL | Status: DC
  Administered 2021-11-03 – 2021-11-04 (×3): 200 mg via ORAL
  Filled 2021-11-02 (×5): qty 1

## 2021-11-02 MED ORDER — POTASSIUM CHLORIDE CRYS ER 20 MEQ PO TBCR
40.0000 meq | EXTENDED_RELEASE_TABLET | Freq: Once | ORAL | Status: AC
  Administered 2021-11-03: 40 meq via ORAL
  Filled 2021-11-02: qty 2

## 2021-11-02 NOTE — BH Assessment (Signed)
Clinician messaged Linda Toler, RN: "Hey. It's Trey with TTS. Is the pt able to engage in the assessment, if so the pt will need to be placed in a private room. Is the pt under IVC? Also is the pt medically cleared?"   Clinician awaiting response.    Kishia Shackett D Verlaine Embry, MS, LCMHC, CRC Triage Specialist [phone]  

## 2021-11-02 NOTE — ED Triage Notes (Signed)
Per EMS patient was found on someones deck . Patient opens her eyes to verbal stimul. Patient is seen giggling to her self. Refused vital signs by multiple providers . Patient currently in no acute distress .

## 2021-11-02 NOTE — ED Notes (Signed)
Pt is refusing to eat the meal tray provided. Pt stated that she didn't want it offered pt a [place] sandwich and she stated that is what we have been feeding her all day. Pt refused to let this RN to obtain VS and refused to take ordered meds. Pt did request a pair of socks. Advised her would go and get her a pair. When returned with the socks pt was attempting to eat a [place] sandwich that she has in the room. Offered to help put the socks on and pt stated that she would do it.

## 2021-11-02 NOTE — ED Notes (Signed)
RN Job Founds was discussing the patients questions about restraint removal. She seemed slightly agitated. RN Paden stated she would speak with the physician regarding same, as we were exiting the room the patient attempted to swing at RN Paden but restraints prevented any harm to Paden or myself.

## 2021-11-02 NOTE — ED Notes (Signed)
Entered the room to talk to pt and pt pretended to be a sleep and wouldn't answer any questions at this time

## 2021-11-02 NOTE — ED Notes (Signed)
Was in room with another pt. During this time staff noted that the pt had eloped.out of the room. Security is aware and is looking for the pt at this time. Charge nurse is aware as well

## 2021-11-02 NOTE — ED Notes (Signed)
TTS called to assess patient. Patient initially responsive to RN that she was was awake however upon TTS attempt to speak with her she stopped answering questions and declined to communicate. TTS reports they will attempt assessment at a later time.

## 2021-11-03 ENCOUNTER — Other Ambulatory Visit: Payer: Self-pay

## 2021-11-03 ENCOUNTER — Encounter (HOSPITAL_COMMUNITY): Payer: Self-pay

## 2021-11-03 NOTE — ED Notes (Signed)
Pt reported that she left urine sample in the bathroom, however when this RN went to collect it the sealed bottle was thrown in the trash. Pt refusing to provide urine sample

## 2021-11-03 NOTE — ED Notes (Signed)
Patient reminded of the need for a urine sample at this time patient did not verbalize understanding

## 2021-11-03 NOTE — Progress Notes (Signed)
Per Shalon Bobbitt, NP, patient meets criteria for inpatient treatment. There are no available beds at [facility] today. CSW faxed referrals to the following facilities for review:  [facility]  Pending - Request Sent N/A [address] [place] [address] [phone] --  [facility]  Pending - Request Sent N/A [address] [place] [address] [phone] --  [facility]  Pending - Request Sent N/A [address] [place] [address] [phone] --  [facility]  Pending - Request Sent N/A [address] [place] [address] [phone] --  [facility]  Pending - Request Sent N/A [address] [place] [address] [phone] --  [facility]  Pending - Request Sent N/A [address], [place] [address] [phone] --  [facility]  Pending - Request Sent N/A [address], [place] [address] [phone] --  [facility]  Pending - Request Sent N/A [address] [place] [address] [phone] --  [facility]  Pending - Request Sent N/A [address] [place] [address] [phone] --  [facility]  Pending - Request Sent N/A [address] [place] [address] [phone] --  [facility]  Pending - Request Sent N/A [address], [place] [address] [phone] --  [facility]  Pending - Request Sent N/A [address], [place] [address] [phone] --  [facility]  Pending - Request Sent N/A [address] [place] [address] [phone] --  [facility]  Pending - Request Sent N/A [address], [place] [address] [phone] --  [facility]  Pending - Request Sent N/A [address], [place] [address] [phone] --   TTS will continue to seek bed placement.  Johny Pitstick, MSW, LCSW-A, LCAS Phone: [phone] Disposition/TOC  

## 2021-11-04 ENCOUNTER — Emergency Department (HOSPITAL_COMMUNITY): Payer: Self-pay

## 2021-11-04 DIAGNOSIS — F1990 Other psychoactive substance use, unspecified, uncomplicated: Secondary | ICD-10-CM | POA: Diagnosis present

## 2021-11-04 DIAGNOSIS — F1999 Other psychoactive substance use, unspecified with unspecified psychoactive substance-induced disorder: Secondary | ICD-10-CM | POA: Diagnosis present

## 2021-11-04 MED ORDER — ALUM & MAG HYDROXIDE-SIMETH 200-200-20 MG/5ML PO SUSP
30.0000 mL | Freq: Four times a day (QID) | ORAL | Status: DC | PRN
  Administered 2021-11-04: 30 mL via ORAL
  Filled 2021-11-04: qty 30

## 2021-11-04 MED ORDER — ZIPRASIDONE MESYLATE 20 MG IM SOLR
20.0000 mg | Freq: Once | INTRAMUSCULAR | Status: AC
  Administered 2021-11-04: 20 mg via INTRAMUSCULAR

## 2021-11-04 MED ORDER — OLANZAPINE 5 MG PO TBDP
10.0000 mg | ORAL_TABLET | Freq: Every day | ORAL | Status: DC
  Filled 2021-11-04: qty 2

## 2021-11-04 MED ORDER — LORAZEPAM 2 MG/ML IJ SOLN
2.0000 mg | Freq: Once | INTRAMUSCULAR | Status: AC
  Administered 2021-11-04: 2 mg via INTRAMUSCULAR
  Filled 2021-11-04: qty 1

## 2021-11-04 MED ORDER — POTASSIUM CHLORIDE CRYS ER 20 MEQ PO TBCR: 40.0000 meq | EXTENDED_RELEASE_TABLET | Freq: Once | ORAL | Status: DC

## 2021-11-04 MED ORDER — ACETAMINOPHEN 325 MG PO TABS
650.0000 mg | ORAL_TABLET | ORAL | Status: DC | PRN
  Administered 2021-11-04: 650 mg via ORAL
  Filled 2021-11-04: qty 2

## 2021-11-04 NOTE — ED Notes (Signed)
Patient transported to CT 

## 2021-11-04 NOTE — ED Notes (Signed)
Pt reports neck pain, requesting tylenol. Preston Fleeting MD made aware.

## 2021-11-04 NOTE — ED Notes (Signed)
Pt awake, eating breakfast at this time. Pt in NAD at this time.

## 2021-11-04 NOTE — ED Notes (Signed)
Assisted with hard restraint placement- x 4 extremities. Checked circulation after application, security ws at bedside to assist- pt yelling and swearing at staff- "You are a fucking bitch" "get the fuck off of me" multiple staff members necessary to apply restraints.

## 2021-11-04 NOTE — Progress Notes (Signed)
Per Maxie Barb, NP, patient meets criteria for inpatient treatment. There are no available beds at [facility] today. CSW re-faxed referrals to the following facilities for review:  [facility]  Pending - Request Sent N/A [address], [place] [address] [phone] --  [facility]  Pending - Request Sent N/A [address], [place] [address] [phone] --  [facility]  Pending - Request Sent N/A [address] [place] [address] [phone] --  [facility]  Pending - Request Sent N/A [address] [place] [address] [phone] --  [facility]  Pending - Request Sent N/A [address] [place] [address] [phone] --  [facility]  Pending - Request Sent N/A [address] [place] [address] [phone] --  [facility]  Pending - Request Sent N/A [address] [place], [place] [address] [phone] --  [facility]  Pending - Request Sent N/A [address], [place] [address] [phone] --  [facility]  Pending - Request Sent N/A [address] [place] [address] [phone] --  [facility]  Pending - Request Sent N/A [address] [place] [address] [phone] --  [facility]  Pending - Request Sent N/A [address] [place] [address] [phone] --  [facility]  Pending - Request Sent N/A [address] [place] [address] [phone] --  [facility]  Pending - Request Sent N/A [address], [place] [address] [phone] --  [facility]  Pending - Request Sent N/A [address], [address] [place] [address] [phone] --  [facility]  Pending - Request Sent N/A [address] [place] [address] [phone] --   TTS will continue to seek bed placement.  Crissie Reese, MSW, Lenice Pressman Phone: [phone] Disposition/TOC

## 2021-11-04 NOTE — ED Notes (Addendum)
Violent wrist restraints placed on pt. Pt biting and kicking at staff, cursing at staff.

## 2021-11-04 NOTE — ED Notes (Addendum)
Pt requesting something to eat. Pt given bagged lunch and soda

## 2021-11-04 NOTE — ED Notes (Signed)
Pt spitting into floor, this RN told pt to stop spitting into floor and to spit into provided emesis bag, pt proceeded to spit on this RN. EDP aware

## 2021-11-04 NOTE — ED Provider Notes (Addendum)
Patient is complaining of burping a lot. No abd or chest pain. Will give maalox.    Pricilla Loveless, MD 11/04/21 1029  1:43 PM Patient tried to leave and is agitated.  She is now spitting at staff.  QTc from 2 days ago was just over 500.  Was given IM Ativan  2:36 PM No significant help with the IM Ativan.  We will do IM Geodon as we have gotten an EKG and the QTc is now 484.  We will keep her on the cardiac monitor.  CRITICAL CARE Performed by: Audree Camel   Total critical care time: 30 minutes  Critical care time was exclusive of separately billable procedures and treating other patients.  Critical care was necessary to treat or prevent imminent or life-threatening deterioration.  Critical care was time spent personally by me on the following activities: development of treatment plan with patient and/or surrogate as well as nursing, discussions with consultants, evaluation of patient's response to treatment, examination of patient, obtaining history from patient or surrogate, ordering and performing treatments and interventions, ordering and review of laboratory studies, ordering and review of radiographic studies, pulse oximetry and re-evaluation of patient's condition.    Pricilla Loveless, MD 11/04/21 [phone]

## 2021-11-04 NOTE — ED Notes (Signed)
Pt alert and resting. Respirations even and unlabored. NAD noted at this time
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
## 2021-11-04 NOTE — ED Notes (Signed)
Pt asked staff member passing by if she could go outside and sit. Staff member made RN aware, RN was delivering care to another pt. Pt proceeded to leave department and went out into EMS bay, followed by ER staff and security. Pt redirected back into ER exam area. Security remains at bedside. Pt in stretcher spitting. EDP aware.

## 2021-11-04 NOTE — ED Notes (Signed)
Pt asleep, resting comfortably at this time. Visible rise and fall of chest noted. Pt appears in NAD.  ?

## 2021-11-04 NOTE — ED Notes (Signed)
Pt making phone call at this time.

## 2021-11-04 NOTE — ED Notes (Signed)
Patient resting quietly in hall bed with eyes closed
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
## 2021-11-04 NOTE — ED Notes (Signed)
Pt awake, requesting to use RR. Pt taken to RR in wheelchair by this RN. Pt provided new scrubs to change into.

## 2021-11-04 NOTE — ED Notes (Signed)
Pt awake, calm and cooperative.

## 2021-11-04 NOTE — ED Notes (Signed)
Pt c/o indigestion and abdominal pain, pt has been belching this AM. Criss Alvine MD aware, at bedside to assess pt.

## 2021-11-04 NOTE — ED Notes (Signed)
Pt awake, calm and cooperative. This RN informed pt that she would be transported to CT. Pt agreed

## 2021-11-05 ENCOUNTER — Encounter (HOSPITAL_COMMUNITY): Payer: Self-pay | Admitting: Registered Nurse

## 2021-11-05 ENCOUNTER — Other Ambulatory Visit: Payer: Self-pay

## 2021-11-05 ENCOUNTER — Encounter: Payer: Self-pay | Admitting: Emergency Medicine

## 2021-11-05 ENCOUNTER — Emergency Department
Admission: EM | Admit: 2021-11-05 | Discharge: 2021-11-05 | Payer: Medicaid Other | Attending: Emergency Medicine | Admitting: Emergency Medicine

## 2021-11-05 ENCOUNTER — Emergency Department: Payer: Medicaid Other

## 2021-11-05 DIAGNOSIS — F29 Unspecified psychosis not due to a substance or known physiological condition: Secondary | ICD-10-CM

## 2021-11-05 DIAGNOSIS — F1999 Other psychoactive substance use, unspecified with unspecified psychoactive substance-induced disorder: Secondary | ICD-10-CM

## 2021-11-05 DIAGNOSIS — S161XXA Strain of muscle, fascia and tendon at neck level, initial encounter: Secondary | ICD-10-CM | POA: Insufficient documentation

## 2021-11-05 DIAGNOSIS — R519 Headache, unspecified: Secondary | ICD-10-CM | POA: Diagnosis not present

## 2021-11-05 DIAGNOSIS — S199XXA Unspecified injury of neck, initial encounter: Secondary | ICD-10-CM | POA: Diagnosis present

## 2021-11-05 DIAGNOSIS — Y900 Blood alcohol level of less than 20 mg/100 ml: Secondary | ICD-10-CM | POA: Insufficient documentation

## 2021-11-05 DIAGNOSIS — Y92410 Unspecified street and highway as the place of occurrence of the external cause: Secondary | ICD-10-CM | POA: Diagnosis not present

## 2021-11-05 DIAGNOSIS — S40022A Contusion of left upper arm, initial encounter: Secondary | ICD-10-CM | POA: Diagnosis not present

## 2021-11-05 DIAGNOSIS — F1990 Other psychoactive substance use, unspecified, uncomplicated: Secondary | ICD-10-CM

## 2021-11-05 DIAGNOSIS — Z008 Encounter for other general examination: Secondary | ICD-10-CM

## 2021-11-05 LAB — CBC WITH DIFFERENTIAL/PLATELET
Abs Immature Granulocytes: 0.01 10*3/uL (ref 0.00–0.07)
Basophils Absolute: 0 10*3/uL (ref 0.0–0.1)
Basophils Relative: 0 %
Eosinophils Absolute: 0 10*3/uL (ref 0.0–0.5)
Eosinophils Relative: 1 %
HCT: 35.2 % — ABNORMAL LOW (ref 36.0–46.0)
Hemoglobin: 11.7 g/dL — ABNORMAL LOW (ref 12.0–15.0)
Immature Granulocytes: 0 %
Lymphocytes Relative: 47 %
Lymphs Abs: 2.2 10*3/uL (ref 0.7–4.0)
MCH: 30.4 pg (ref 26.0–34.0)
MCHC: 33.2 g/dL (ref 30.0–36.0)
MCV: 91.4 fL (ref 80.0–100.0)
Monocytes Absolute: 0.3 10*3/uL (ref 0.1–1.0)
Monocytes Relative: 7 %
Neutro Abs: 2.1 10*3/uL (ref 1.7–7.7)
Neutrophils Relative %: 45 %
Platelets: 224 10*3/uL (ref 150–400)
RBC: 3.85 MIL/uL — ABNORMAL LOW (ref 3.87–5.11)
RDW: 12.9 % (ref 11.5–15.5)
WBC: 4.7 10*3/uL (ref 4.0–10.5)
nRBC: 0 % (ref 0.0–0.2)

## 2021-11-05 LAB — COMPREHENSIVE METABOLIC PANEL
ALT: 23 U/L (ref 0–44)
AST: 27 U/L (ref 15–41)
Albumin: 4.2 g/dL (ref 3.5–5.0)
Alkaline Phosphatase: 49 U/L (ref 38–126)
Anion gap: 8 (ref 5–15)
BUN: 9 mg/dL (ref 6–20)
CO2: 27 mmol/L (ref 22–32)
Calcium: 9 mg/dL (ref 8.9–10.3)
Chloride: 104 mmol/L (ref 98–111)
Creatinine, Ser: 0.99 mg/dL (ref 0.44–1.00)
GFR, Estimated: >60 mL/min (ref >60)
Glucose, Bld: 106 mg/dL — ABNORMAL HIGH (ref 70–99)
Potassium: 3.5 mmol/L (ref 3.5–5.1)
Sodium: 139 mmol/L (ref 135–145)
Total Bilirubin: 0.7 mg/dL (ref 0.3–1.2)
Total Protein: 7.9 g/dL (ref 6.5–8.1)

## 2021-11-05 LAB — ETHANOL: Alcohol, Ethyl (B): <10 mg/dL (ref <10)

## 2021-11-05 MED ORDER — METHOCARBAMOL 500 MG PO TABS: 500.0000 mg | ORAL_TABLET | Freq: Four times a day (QID) | ORAL | 0 refills | Status: DC

## 2021-11-05 MED ORDER — MELOXICAM 15 MG PO TABS: 15.0000 mg | ORAL_TABLET | Freq: Every day | ORAL | 0 refills | Status: DC

## 2021-11-05 NOTE — Consult Note (Addendum)
[facility] Psych ED Progress Note  11/05/2021 2:17 PM [name]  MRN:  [id number]   Subjective:   Sara Taylor is a 31 y.o. female patient admitted to [facility] ED after presenting via EMS with complaints that sh was found on someone deck napping.  Sara Taylor, 31 y.o., female patient seen face to face by this provider, consulted with Dr. Earlene Plater; and chart reviewed on 11/05/21.  On evaluation Sara Taylor is lying in bed with covers pulled over her head and refuses to participate in assessment.  Covers were pulled down and patient pretending to sleep.  Attempted to get patient to participate several times but would not.  Patient then informed that would come back later to complete assessment.    Nursing informed that patient was now up and wanted to speak with psychiatry.  Once back in patients room patient began cursing that she wanted to go home and that she didn't want to speak to anybody.  "I done spoke to ya'll 6 damn times and ya'll ain't let me go home yet.  You can get out of my damn face.  Hell naw I don't want to talk to you ain't gone do no damn good.  I'm still in here."  When asked if she was having thoughts of wanting to hurt or kill herself she states "Are you having thoughts to kill yo self."  When asked about homicidal ideation patient states "Hell naw"  Patient also denies psychosis and paranoia.   During evaluation [name] is sitting in chair with no noted distress.  She is alert, oriented x 3.  Patient is irritable about being in hospital and wanting to go home.  She is uncooperative throughout assessment.  Nursing reports that she has been mostly sleeping and but will ask for what she wants.  Patient has normal speech, moderate volume, and moderate pace.  Objectively there is no evidence of psychosis/mania or delusional thinking.  She also denies suicidal/self-harm/homicidal ideation, psychosis, and paranoia.      Principal Problem: Substance-induced  disorder (HCC) Diagnosis:  Principal Problem:   Substance-induced disorder (HCC) Active Problems:   Polysubstance use disorder   ED Assessment Time Calculation: No data recorded  Past Psychiatric History: No noted psychiatric history per chart review other than a previous ED visit with similar behavior possible related to substance induced   [place] Scale:  Flowsheet Row ED from 11/02/2021 in [facility] EMERGENCY DEPARTMENT  C-SSRS RISK CATEGORY Error: Q6 is Yes, you must answer 7       Past Medical History: History reviewed. No pertinent past medical history. History reviewed. No pertinent surgical history. Family History: History reviewed. No pertinent family history. Family Psychiatric  History: None noted or reported Social History:  Social History   Substance and Sexual Activity  Alcohol Use None     Social History   Substance and Sexual Activity  Drug Use Not on file    Social History   Socioeconomic History   Marital status: Unknown    Spouse name: Not on file   Number of children: Not on file   Years of education: Not on file   Highest education level: Not on file  Occupational History   Not on file  Tobacco Use   Smoking status: Not on file   Smokeless tobacco: Not on file  Substance and Sexual Activity   Alcohol use: Not on file   Drug use: Not on file   Sexual  activity: Not on file  Other Topics Concern   Not on file  Social History Narrative   Not on file   Social Determinants of Health   Financial Resource Strain: Not on file  Food Insecurity: Not on file  Transportation Needs: Not on file  Physical Activity: Not on file  Stress: Not on file  Social Connections: Not on file    Sleep: Good  Appetite:  Good  Current Medications: Current Facility-Administered Medications  Medication Dose Route Frequency Provider Last Rate Last Admin   acetaminophen (TYLENOL) tablet 650 mg  650 mg Oral Q4H PRN Dione Booze, MD   650 mg  at 11/04/21 2331   alum & mag hydroxide-simeth (MAALOX/MYLANTA) 200-200-20 MG/5ML suspension 30 mL  30 mL Oral Q6H PRN Pricilla Loveless, MD   30 mL at 11/04/21 1040   labetalol (NORMODYNE) tablet 200 mg  200 mg Oral BID Wynetta Fines, MD   200 mg at 11/04/21 2225   OLANZapine zydis (ZYPREXA) disintegrating tablet 10 mg  10 mg Oral Daily Leevy-Johnson, Brooke A, NP       potassium chloride SA (KLOR-CON M) CR tablet 40 mEq  40 mEq Oral Once Pricilla Loveless, MD       Current Outpatient Medications  Medication Sig Dispense Refill   labetalol (NORMODYNE) 100 MG tablet Take 200 mg by mouth 2 (two) times daily.     metroNIDAZOLE (FLAGYL) 500 MG tablet Take 500 mg by mouth See admin instructions. Bid x 7 days (Patient not taking: Reported on 11/02/2021)     Prenatal Vit-Fe Fumarate-FA (PRENATAL VITAMINS) 28-0.8 MG TABS Take 1 tablet by mouth daily.      Lab Results: No results found for this or any previous visit (from the past 48 hour(s)).  Blood Alcohol level:  Lab Results  Component Value Date   ETH <10 11/02/2021    Physical Findings:  CIWA:    COWS:     Musculoskeletal: Strength & Muscle Tone: within normal limits Gait & Station: normal Patient leans: N/A  Psychiatric Specialty Exam:  Presentation  General Appearance: Appropriate for Environment  Eye Contact:Good  Speech:Clear and Coherent; Normal Rate  Speech Volume:Increased  Handedness:Right  Mood and Affect  Mood:Angry  Affect:Congruent   Thought Process  Thought Processes:Coherent; Linear  Descriptions of Associations:Intact  Orientation:Full (Time, Place and Person)  Thought Content:Rumination  History of Schizophrenia/Schizoaffective disorder:No  Duration of Psychotic Symptoms:N/A  Hallucinations:Hallucinations: None Description of Auditory Hallucinations: pt not descriptive Description of Visual Hallucinations: pt not descriptive  Ideas of Reference:None  Suicidal Thoughts:Suicidal Thoughts:  No  Homicidal Thoughts:Homicidal Thoughts: No   Sensorium  Memory:Other (comment) (Unable to assess related to patient unwilling to answer questions)  Judgment:Fair  Insight:Fair   Executive Functions  Concentration:Good  Attention Span:Good  Recall:Fair  Fund of Knowledge:Fair  Language:Fair   Psychomotor Activity  Psychomotor Activity:Psychomotor Activity: Normal   Assets  Assets:Physical Health   Sleep  Sleep:Sleep: Good    Physical Exam: Physical Exam Vitals and nursing note reviewed. Exam conducted with a chaperone present.  Constitutional:      General: She is not in acute distress.    Appearance: Normal appearance. She is not ill-appearing.  Cardiovascular:     Rate and Rhythm: Normal rate.  Pulmonary:     Effort: Pulmonary effort is normal.  Neurological:     Mental Status: She is alert and oriented to person, place, and time.  Psychiatric:        Attention and Perception: Attention and perception  normal. She does not perceive auditory or visual hallucinations.        Mood and Affect: Affect is angry.        Speech: Speech normal.        Behavior: Behavior is uncooperative.        Thought Content: Thought content is not paranoid or delusional. Thought content does not include homicidal or suicidal ideation.        Judgment: Judgment is impulsive.    Review of Systems  Unable to perform ROS: Other (Refused to answer questions)  Psychiatric/Behavioral:  Negative for hallucinations and suicidal ideas.    Blood pressure (!) 142/81, pulse 76, temperature 98.4 F (36.9 C), temperature source Oral, resp. rate 18, SpO2 99 %. There is no height or weight on file to calculate BMI.   Medical Decision Making:  MIAMOR PIGOTT was admitted to [facility] ED related to odd behavior and being found sleeping on someone's porch and possible Substance-induced disorder [facility]), crisis management, and stabilization. Routine labs ordered, which include Lab Orders          Basic metabolic panel         CBC with Differential         Ethanol         Urine rapid drug screen (hosp performed)         Acetaminophen level         Salicylate level         RPR         HIV Antibody (routine testing w rflx)         Rapid HIV screen (HIV 1/2 Ab+Ag) ([facility] Only)         Rapid urine drug screen (hospital performed)         I-Stat beta hCG blood, ED    Medication Management: Medications started  labetalol  200 mg Oral BID   OLANZapine zydis  10 mg Oral Daily   potassium chloride SA  40 mEq Oral Once   Psychiatric reassessment completed and patient denies suicidal/homicidal ideation, psychosis, and paranoia.   Resources for outpatient psychiatric services, community services, and shelters added to discharge instructions.   Disposition: No evidence of imminent risk to self or others at present.   Patient does not meet criteria for psychiatric inpatient admission. Supportive therapy provided about ongoing stressors. Discussed crisis plan, support from social network, calling 911, coming to the Emergency Department, and calling Suicide Hotline.    Laniyah Rosenwald, NP 11/05/2021, 2:17 PM

## 2021-11-05 NOTE — Discharge Instructions (Signed)
Substance Abuse Treatment Programs  Intensive Outpatient Programs [facility]     [address]      [place], [place]                   [phone]       [facility] [address] [place] [address] [place], [place] [phone]  [facility]     (Inpatient and outpatient)     [address]           [phone]    [facility] [phone] (Suboxone and Methadone)  [address]      [place], [place] [address]      [phone]       [address] [place], [place] [phone]  [facility] (Outpatient/Inpatient, [facility])    (insurance only) [phone]             [facility] (Groups & Residential) [place], [place] [phone]     [facility]     [address]     [place], [place]      [phone]       [facility] (for caregivers and family) [phone] Laurell Josephs[address] [place], [place] [phone]      Residential Treatment Programs [facility]      [address] [place], [place] [address]  [phone]       [facility] [address], [place], [place] [address] [phone]  [facility] [place]        [phone]       [facility] [phone]  [facility]             [address]                                         [place], [place]                                                [phone] or [phone]                               [facility] [place] [address] [place], [address] [phone]  [facility]    [address]      [place], [place]     [phone]       [facility] [address] [place], [place] [phone]  [facility]   [address] [place], [place] [address]     [phone]      Admissions: 8am-3pm M-F  [facility] (RTS) [address] [place],  [place] [phone]  [facility]: Residential Program [phone] Days)   [place], [place]      [phone] or [phone]     [facility]: [facility] [place], [place] (Walk in Hours over the weekend or by referral)  [facility] [place] [address] [place], [place], [place] [address] [phone]  Crisis Mobile: [facility]:  [phone] (for crisis response 24 hours a day) [facility] Hotline:      [phone] Outpatient Psychiatry and Counseling  [facility]: Mobile Crisis  Management 24 hours:  [phone]  [facility] of the [employer] sliding scale fee and walk in schedule: M-F 8am-12pm/1pm-3pm [address]  [place], [place] [address] [phone]  [facility] [address] [place], [place] [address] [phone]  [facility] ([facility] [employer])- new patient walk-in appointments available Monday - Friday 8am -3pm.          [address] [place], [place] [address] [phone] or crisis line- [phone]  [facility] Services/ Intensive Outpatient Therapy Program [address] [place], [place] [address] [phone]  [facility]                  Crisis Services      [phone] [address]     [place], [place] [address]                 [facility]   [facility] [phone]. [address] [place], [place] [address]   [facility]          [address] Bea Laura  [place], [place] [address]       [phone]  [facility] [address] [place], [place] [address] [phone]  [facility]    [address]    [place], [place] [address]     [phone]       Andee Poles, MD     [address]     [place] [address]     [phone]       [facility] [address] [place] [address]  [facility]     [address] [place], [place]      [phone]       [facility] Eul[facility] Ditch, MD [address] [place], [place] [address] [phone]  [facility]     [address]     [place], [place] [address]     [phone]       [facility] [address] [place], [place] [address] [phone] Resources for Temporary Residential Assistance/Crisis Centers  DAY CENT[facility]) M-F 8am-3pm   [address] [place], [place] [address]   [phone] Services include: laundry, barbering, support groups, case management, phone  & computer access, showers, AA/NA mtgs, mental health/substance abuse nurse, job skills class, disability information, VA assistance, spiritual classes, etc.   HOMELESS SHELTERS  [facility]     [facility]   [address], [place]     [phone]              [employer] (women and children)       [address] [place], [place] [address] [phone] [email or web address] for application and process Application Required  [facility]   [address]    [place] [address]     [phone]                    [facility] [address] [place], [place] [address] [phone] application appt.) Application Required  [facility] (women only)    [address]     [place], [place] [address]     [phone]  Intake starts 6pm daily Need valid ID, SSC, & Police report [employer] [address] [place], [place] [phone] Application Required  [employer] (men only)     [address]      [place], [place]     [phone]       [facility] of the [facility] (Pregnant women only) [address] [place], [place] [phone]  [facility]      [address]      [place], [place] [address]     [phone]             [facility] [address] [place], [place] [phone] 90 day commitment/SA/Application process  [facility](men only)     [address]     [place], [place]     [phone]       Check-in at [facility] of [facility] [address] [place], [place] [address] [phone] Men/Women/Women and Children must be there by 7 pm  [facility] [place], [place] [phone]                    Outpatient psychiatric Services  Walk in hours for medication management Monday, Wednesday, Thursday, and Friday from 8:00 AM to 11:00 AM Recommend arriving by by 7:30 AM.  It is first come first serve.    Walk in hours for therapy intake Monday and Wednesday only 8:00 AM to 11:00 AM Encouraged to arrive by 7:30 AM.  It is first come first serve   Inpatient patient psychiatric services [facility] offers comprehensive behavioral heath care services for mental health and substance abuse treatment.  Social work can also assist with referral to or getting you into a rehabilitation program short or long term

## 2021-11-05 NOTE — ED Notes (Signed)
[employer] requesting Toxicology blood draw, and medical clearance.

## 2021-11-05 NOTE — Progress Notes (Signed)
Patient has been denied by [facility] due to no appropriate beds available. Patient meets BH inpatient criteria per Maxie Barb, NP. Patient has been faxed out to the following facilities:    [facility]  [address] [place] [address] [phone]  [facility]  [address] [place] [address] [phone]  [facility]  [address], [place] [address] [phone]  [facility]  [address], [place] [address] [phone]  [facility]  [address], [place] [address] [phone]  [facility]  [address] [place] [address] [phone]  [facility]  [address] [place], [place] [address] [phone]  [facility]  [address] [place] [address] [phone]  [facility]  [address] [place] [address] [phone]  [facility]  [address] [place] [address] [phone]  [facility]  [address] [place] [address] [phone]  [facility]  [address] [place] [address] [phone]  [facility]  [address] [place] [address] [phone]  [facility]  [address], [place] [address] [phone]  [facility]  [address] [place] [address] [phone]   Damita Dunnings, MSW, LCSW-A  10:44 AM 11/05/2021
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
## 2021-11-05 NOTE — ED Notes (Signed)
Papers filled out by Effie Shy MD to resend IVC and given to secretary to fax

## 2021-11-05 NOTE — ED Notes (Signed)
Pt yelling and cussing in her room after [facility] providers tried to wake her up and are now gone. BH providers notified and will be back to assess pt

## 2021-11-05 NOTE — ED Notes (Signed)
Reviewed discharge instructions with patient. Follow-up care and resources reviewed. Patient verbalized understanding. Patient A&Ox4, VS refused, and ambulatory with steady gait upon discharge.   Pt did not come in with any belongings. Pt provided w/ blue paper scrubs and socks for DC.

## 2021-11-05 NOTE — Progress Notes (Signed)
Pt is unplugging the bed pressing buttons on the wall went to bathroom playing with emergency call light currently back in room talking to herself

## 2021-11-05 NOTE — ED Provider Notes (Signed)
Emergency Medicine Observation Re-evaluation Note  Sara Taylor is a 31 y.o. female, seen on rounds today.  Pt initially presented to the ED for complaints of Medical Clearance Currently, the patient is agitated and wants to leave.  TTS is seeing her and have cleared her psychiatrically.  They are going to give her resources..  Physical Exam  BP (!) 142/81   Pulse 76   Temp 98.4 F (36.9 C) (Oral)   Resp 18   SpO2 99%  Physical Exam General: Overweight, irritable Cardiac: Normal heart rate Lungs: Normal respiratory rate Psych: No internal responsiveness  ED Course / MDM  EKG:EKG Interpretation  Date/Time:  Sunday November 04 2021 14:32:01 EDT Ventricular Rate:  101 PR Interval:  130 QRS Duration: 80 QT Interval:  374 QTC Calculation: 484 R Axis:   54 Text Interpretation: Sinus tachycardia Nonspecific T wave abnormality Abnormal ECG When compared with ECG of 02-Nov-2021 12:46, HEART RATE has increased Confirmed by Dione Booze ([id number]) on 11/05/2021 1:27:36 AM  I have reviewed the labs performed to date as well as medications administered while in observation.  Recent changes in the last 24 hours include has been seen and cleared by psychiatry.  They gave her outpatient resources.  The patient is stable for discharge, from the ED at this time Plan  Current plan is for discharge, with outpatient follow-up. Sara Taylor is under involuntary commitment.      Mancel Bale, MD 11/05/21 1151

## 2021-11-05 NOTE — ED Notes (Signed)
BH providers and EDP came to bedside. Pt verbally aggressive w/ [facility] providers

## 2021-11-05 NOTE — ED Notes (Signed)
Notified blue secretary of need for pt IVC paperwork
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
## 2021-11-06 ENCOUNTER — Encounter: Payer: Self-pay | Admitting: Emergency Medicine

## 2022-08-31 ENCOUNTER — Emergency Department (HOSPITAL_COMMUNITY)
Admission: EM | Admit: 2022-08-31 | Discharge: 2022-08-31 | Disposition: A | Discharge status: Home / Self Care | Payer: BLUE CROSS/BLUE SHIELD | Attending: Student | Admitting: Student

## 2022-08-31 ENCOUNTER — Encounter (HOSPITAL_COMMUNITY): Payer: Self-pay | Admitting: *Deleted

## 2022-08-31 ENCOUNTER — Other Ambulatory Visit: Payer: Self-pay

## 2022-08-31 ENCOUNTER — Emergency Department (HOSPITAL_COMMUNITY): Payer: BLUE CROSS/BLUE SHIELD

## 2022-08-31 DIAGNOSIS — M25572 Pain in left ankle and joints of left foot: Secondary | ICD-10-CM | POA: Diagnosis present

## 2022-08-31 DIAGNOSIS — M7662 Achilles tendinitis, left leg: Secondary | ICD-10-CM | POA: Diagnosis not present

## 2022-08-31 MED ORDER — IBUPROFEN 600 MG PO TABS: 600.0000 mg | ORAL_TABLET | Freq: Four times a day (QID) | ORAL | 0 refills | Status: DC | PRN

## 2022-08-31 NOTE — ED Provider Notes (Signed)
[facility] Emergency Department Provider Note MRN:  [id number]  Arrival date & time: 08/31/22     Chief Complaint   Ankle Pain   History of Present Illness   Sara Taylor is a 32 y.o. year-old female presents to the ED with chief complaint of left ankle pain for the past 3-4 months.  She reports hx of achilles tendon rupture.  States that the pain radiates into the heel.  Denies any recent injury.  States that it improves with NSAIDs.  History provided by patient.   Review of Systems  Pertinent positive and negative review of systems noted in HPI.    Physical Exam   Vitals:   08/31/22 1901 08/31/22 1903  BP: 98/86 (!) 142/90  Pulse: 80 76  Resp: 16 16  Temp: 98.7 F (37.1 C)   SpO2: 100% 98%    CONSTITUTIONAL:  non toxic-appearing, NAD NEURO:  Alert and oriented x 3, CN 3-12 grossly intact EYES:  eyes equal and reactive ENT/NECK:  Supple, no stridor  CARDIO:  normal rate, regular rhythm, appears well-perfused  PULM:  No respiratory distress,  GI/GU:  non-distended,  MSK/SPINE:  No gross deformities, no edema, moves all extremities, ambulates without difficulty SKIN:  no rash, atraumatic   *Additional and/or pertinent findings included in MDM below  Diagnostic and Interventional Summary    EKG Interpretation  Date/Time:    Ventricular Rate:    PR Interval:    QRS Duration:   QT Interval:    QTC Calculation:   R Axis:     Text Interpretation:         Labs Reviewed - No data to display  DG Ankle Complete Left  Final Result      Medications - No data to display   Procedures  /  Critical Care Procedures  ED Course and Medical Decision Making  I have reviewed the triage vital signs, the nursing notes, and pertinent available records from the EMR.  Social Determinants Affecting Complexity of Care: Patient has no clinically significant social determinants affecting this chief complaint..   ED Course:    Medical  Decision Making Patient here with pain in her left ankle/heel.  Hx of achilles tendon rupture.  She has been ambulating.    Imaging negative.  No sign of infection.  Will give her a boot and have her follow-up with ortho.  Suspect tendonitis or other overuse injury.  Amount and/or Complexity of Data Reviewed Radiology: ordered and independent interpretation performed.    Details: No obvious fracture or dislocation  Risk Prescription drug management.     Consultants: No consultations were needed in caring for this patient.   Treatment and Plan: Emergency department workup does not suggest an emergent condition requiring admission or immediate intervention beyond  what has been performed at this time. The patient is safe for discharge and has  been instructed to return immediately for worsening symptoms, change in  symptoms or any other concerns    Final Clinical Impressions(s) / ED Diagnoses     ICD-10-CM   1. Achilles tendinitis of left lower extremity  M76.62       ED Discharge Orders          Ordered    ibuprofen (ADVIL) 600 MG tablet  Every 6 hours PRN        08/31/22 2245              Discharge Instructions Discussed with and Provided to Patient:  Discharge Instructions   None      Roxy Horseman, Cordelia Poche 08/31/22 2246    Glendora Score, MD 09/01/22 [phone]

## 2022-08-31 NOTE — Progress Notes (Addendum)
Orthopedic Tech Progress Note Patient Details:  Sara Taylor 07/15/1990 [id number]  Ortho Devices Type of Ortho Device: CAM walker Ortho Device/Splint Location: lle Ortho Device/Splint Interventions: Ordered, Application, Adjustment  I applied the boot to the patient and taught them how to apply it. Post Interventions Patient Tolerated: Well Instructions Provided: Care of device, Adjustment of device  [name] Post 08/31/2022, 11:55 PM

## 2022-08-31 NOTE — ED Notes (Signed)
Pt was waiting in the lobby and let staff know she was waiting for her boot.  Pt placed back into a room and ortho tech called to place CAM walker. Discharge papers at bedside.

## 2022-08-31 NOTE — ED Notes (Signed)
Pt left prior to receiving cam boot or discharge instructions. Ambulated from department independently with no distress.

## 2022-08-31 NOTE — ED Triage Notes (Signed)
The pt has had pain and sl swelling in her lt ankle for 3-4 months no known injury    lmp march 25th

## 2022-09-02 ENCOUNTER — Encounter (HOSPITAL_COMMUNITY): Payer: Self-pay

## 2022-09-02 ENCOUNTER — Ambulatory Visit (HOSPITAL_COMMUNITY)
Admission: EM | Admit: 2022-09-02 | Discharge: 2022-09-03 | Disposition: A | Discharge status: Other Acute Inpatient Hospital | Payer: BLUE CROSS/BLUE SHIELD | Attending: Psychiatry | Admitting: Psychiatry

## 2022-09-02 DIAGNOSIS — F29 Unspecified psychosis not due to a substance or known physiological condition: Secondary | ICD-10-CM | POA: Diagnosis not present

## 2022-09-02 MED ORDER — HALOPERIDOL LACTATE 5 MG/ML IJ SOLN
5.0000 mg | Freq: Four times a day (QID) | INTRAMUSCULAR | Status: DC | PRN
  Administered 2022-09-02: 5 mg via INTRAMUSCULAR
  Filled 2022-09-02: qty 1

## 2022-09-02 MED ORDER — ALUM & MAG HYDROXIDE-SIMETH 200-200-20 MG/5ML PO SUSP: 30.0000 mL | ORAL | Status: DC | PRN

## 2022-09-02 MED ORDER — HYDROXYZINE HCL 25 MG PO TABS: 25.0000 mg | ORAL_TABLET | Freq: Three times a day (TID) | ORAL | Status: DC | PRN

## 2022-09-02 MED ORDER — MAGNESIUM HYDROXIDE 400 MG/5ML PO SUSP: 30.0000 mL | Freq: Every day | ORAL | Status: DC | PRN

## 2022-09-02 MED ORDER — TRAZODONE HCL 50 MG PO TABS
50.0000 mg | ORAL_TABLET | Freq: Every evening | ORAL | Status: DC | PRN
  Filled 2022-09-02: qty 1

## 2022-09-02 MED ORDER — HALOPERIDOL 5 MG PO TABS
5.0000 mg | ORAL_TABLET | Freq: Two times a day (BID) | ORAL | Status: DC
  Filled 2022-09-02: qty 1

## 2022-09-02 MED ORDER — ACETAMINOPHEN 325 MG PO TABS: 650.0000 mg | ORAL_TABLET | Freq: Four times a day (QID) | ORAL | Status: DC | PRN

## 2022-09-02 MED ORDER — DIPHENHYDRAMINE HCL 50 MG/ML IJ SOLN
50.0000 mg | Freq: Every evening | INTRAMUSCULAR | Status: DC | PRN
  Administered 2022-09-02: 50 mg via INTRAVENOUS
  Filled 2022-09-02: qty 1

## 2022-09-02 MED ORDER — LORAZEPAM 2 MG/ML IJ SOLN
2.0000 mg | Freq: Four times a day (QID) | INTRAMUSCULAR | Status: DC | PRN
  Administered 2022-09-02: 2 mg via INTRAMUSCULAR
  Filled 2022-09-02: qty 1
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
## 2022-09-02 NOTE — Progress Notes (Addendum)
Received Nevea on the flex area in [facility]  after she was medicated per order. She was cooperative, accepted nourishments and eventually drifted off to sleep. She denied all of the psychiatric symptoms at the present time. Before she arrived to the unit it was reported by security and [employer] who was monitoring her was responding to internal stimuli, pouring water on the floor and screaming at intervals and her fists balled up .Blood work, urine and EKG was not done related to patients behavior.

## 2022-09-02 NOTE — ED Notes (Signed)
Patient is sleeping. Respirations equal and unlabored, skin warm and dry. No change in assessment or acuity. Routine safety checks conducted according to facility protocol. Will continue to monitor for safety.   

## 2022-09-02 NOTE — ED Notes (Signed)
Pt was becoming aggressive, moved to room #137

## 2022-09-02 NOTE — Progress Notes (Signed)
Pt was accepted to [facility] 09/03/2022, pending IVC paperwork faxed to [phone]. Bed assignment: Main campus  Pt meets inpatient criteria per Vernard Gambles, NP  Attending Physician will be Loni Beckwith, MD  Report can be called to: [phone] (this is a pager, please leave call-back number when giving report)  Pt can arrive after 8 AM  Care Team Notified: Vernard Gambles, NP and [facility], RN  [place], [place]  09/02/2022 1:31 PM

## 2022-09-02 NOTE — ED Notes (Signed)
Patient arrived on unit. Patient followed directives aeb MHT observation. Patient received IM willingly. Patient was given meal. Patient calm and safe on unit with continued monitoring.

## 2022-09-02 NOTE — Progress Notes (Signed)
   09/02/22 0954  [facility] Triage Screening (Walk-ins at [facility] only)  How Did You Hear About [place]? Legal System  What Is the Reason for Your Visit/Call Today? Pt presents to [facility] voluntarily escorted by [employer]. Per [employer] they received a call stating that a partially naked woman was banging on peoples doors, they found the pt naked staring into the sky. Pt presented with a blanket wrapped around her, she was given clothing by staff. Pt is tearful and agitated.Pt  states her cousing Janey Greaser can be reached at [phone]. Pt denies drug or alcohol use,SI/HI and AVH at this time.  How Long Has This Been Causing You Problems? <Week  Have You Recently Had Any Thoughts About Hurting Yourself? No  Are You Planning to Commit Suicide/Harm Yourself At This time? No  Have you Recently Had Thoughts About Hurting Someone [name]? No  Are You Planning To Harm Someone At This Time? No  Are you currently experiencing any auditory, visual or other hallucinations? No  Have You Used Any Alcohol or Drugs in the Past 24 Hours? No  Do you have any current medical co-morbidities that require immediate attention? No  Clinician description of patient physical appearance/behavior: swollen lip, scratches on face, agitated, given clothing by staff.  What Do You Feel Would Help You the Most Today? Treatment for Depression or other mood problem  If access to [facility] was not available, would you have sought care in the Emergency Department? No  Determination of Need Urgent (48 hours)  Options For Referral Outpatient Therapy;Medication Management;Inpatient Hospitalization

## 2022-09-02 NOTE — Progress Notes (Signed)
LCSW Progress Note  [id number]   Sara Taylor  09/02/2022  1:13 PM  Description:   Inpatient Psychiatric Referral  Patient was recommended inpatient per Vernard Gambles, NP. There are no available beds at [facility], per [facility], RN. Patient was referred to the following facilities:   Destination  Service Provider Address Phone Fax  [facility]  [address], [place] [address] [phone]  [facility]  [address] [place] [address] [phone]  [facility]  [address], [place] [address] [phone]  [facility]  [address] [place], [place] [address] [phone]  [facility] [address] [place]  [address] [place] [address] [phone]  [facility]  [address] [place], [place] [address] [phone]  [facility]  [address] [place] [address] [phone]  [facility]  [address] [place] [address] [phone]  [facility]  [address] [place] [address] [phone]  [facility]  [address] [place], [place] [address] [phone]  [facility]  [address] [place] [address] [phone]  [facility]  [address] [place] [address] [phone]  [facility]  [address] [place] [address] [phone]  [facility]  [address] [place] [address] [phone]  [facility]  [address], [place] [address] [phone]  [facility]  [address] [place] [address] [phone]  [facility]  [address], [place] [address] [phone]  [facility]  [address], [place] [address] [phone]  [facility]  [address] [place] [address] [phone]  [facility]  [address] [place] [address] [phone]  [facility]  [address] [place] [address] [phone]  [facility]  [address] [place] [address] [phone]  [facility]  [address] [place] [address] [phone]  [facility]  [address], [place] [address] [phone]  [facility]  [address] [place] [address] [phone]  [facility]  [address] [place] [address] [phone]    Situation ongoing, CSW to continue following and update chart as more information becomes available.      Cathie Beams, [place]  09/02/2022 1:13 PM

## 2022-09-02 NOTE — Progress Notes (Signed)
Sara Taylor woke up, used the bathroom and returned to her bed without incident.

## 2022-09-03 MED ORDER — HALOPERIDOL 5 MG PO TABS: 5.0000 mg | ORAL_TABLET | Freq: Two times a day (BID) | ORAL | Status: AC
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
## 2022-09-03 NOTE — ED Notes (Signed)
Report called to Armed forces training and education officer at [facility]. Awaiting sheriff transport.

## 2022-09-03 NOTE — Discharge Instructions (Addendum)
Transfer to [facility] for IP admission, Sara Beckwith, MD  is accepting md

## 2022-09-03 NOTE — ED Notes (Signed)
Pt refused medications and cursed me out

## 2022-09-03 NOTE — ED Notes (Signed)
Patient transferred to [facility] this morning via [place] sheriff [employer]. Patient denies SI/HI/AVH on discharge. Pt agitated about being IVC'ed but not combative.

## 2022-09-03 NOTE — ED Notes (Signed)
Patient is sleeping. Respirations equal and unlabored, skin warm and dry. No change in assessment or acuity. Routine safety checks conducted according to facility protocol. Will continue to monitor for safety.   

## 2023-05-12 ENCOUNTER — Ambulatory Visit (HOSPITAL_COMMUNITY)
Admission: EM | Admit: 2023-05-12 | Discharge: 2023-05-12 | Disposition: A | Discharge status: Home / Self Care | Payer: MEDICAID | Attending: Internal Medicine | Admitting: Internal Medicine

## 2023-05-12 ENCOUNTER — Encounter (HOSPITAL_COMMUNITY): Payer: Self-pay

## 2023-05-12 DIAGNOSIS — J4520 Mild intermittent asthma, uncomplicated: Secondary | ICD-10-CM | POA: Diagnosis not present

## 2023-05-12 DIAGNOSIS — N898 Other specified noninflammatory disorders of vagina: Secondary | ICD-10-CM | POA: Diagnosis not present

## 2023-05-12 DIAGNOSIS — Z113 Encounter for screening for infections with a predominantly sexual mode of transmission: Secondary | ICD-10-CM | POA: Insufficient documentation

## 2023-05-12 LAB — CERVICOVAGINAL ANCILLARY ONLY
Bacterial Vaginitis (gardnerella): POSITIVE — AB
Candida Glabrata: NEGATIVE
Candida Vaginitis: POSITIVE — AB
Chlamydia: NEGATIVE
Comment: NEGATIVE
Comment: NEGATIVE
Comment: NEGATIVE
Comment: NEGATIVE
Comment: NEGATIVE
Comment: NORMAL
Neisseria Gonorrhea: NEGATIVE
Trichomonas: NEGATIVE

## 2023-05-12 LAB — HIV ANTIBODY (ROUTINE TESTING W REFLEX): HIV Screen 4th Generation wRfx: NONREACTIVE

## 2023-05-12 MED ORDER — ALBUTEROL SULFATE HFA 108 (90 BASE) MCG/ACT IN AERS
INHALATION_SPRAY | RESPIRATORY_TRACT | Status: AC
  Filled 2023-05-12: qty 6.7

## 2023-05-12 MED ORDER — ALBUTEROL SULFATE (2.5 MG/3ML) 0.083% IN NEBU
INHALATION_SOLUTION | RESPIRATORY_TRACT | Status: AC
  Filled 2023-05-12: qty 3

## 2023-05-12 MED ORDER — ALBUTEROL SULFATE HFA 108 (90 BASE) MCG/ACT IN AERS
2.0000 | INHALATION_SPRAY | Freq: Once | RESPIRATORY_TRACT | Status: AC
  Administered 2023-05-12: 2 via RESPIRATORY_TRACT

## 2023-05-12 NOTE — ED Triage Notes (Signed)
Patient here today with vaginal itching and soreness that started this morning.   Patient also requesting a nebulizer treatment due to wheezing and has a h/o asthma. Patient uses an albuterol inhaler and last used was last month.

## 2023-05-12 NOTE — ED Provider Notes (Signed)
[facility]    CSN: [id number] Arrival date & time: 05/12/23  2536      History   Chief Complaint Chief Complaint  Patient presents with   Vaginal Itching    HPI Sara Taylor is a 32 y.o. female presents with 2 complains 1- Vaginal itching and vaginal irritation since this am. She denies being on recent antibiotics, having abnormal vaginal discharge, new sexual partner, or bathing in bubble bath or bath bombs in the past 24 hours.She would like STD testing.     2- Has been having mild wheezing x 2 days. She forgot her inhaler when she was out of town this week. She denies being ill, or having much of a cough or rhinitis.  Has hx of asthma and uses Albuterol inhaler for this. The last time used was last month.     Past Medical History:  Diagnosis Date   Anxiety    Asthma    Depression    Hypertension    Obesity    Ovarian cyst     Patient Active Problem List   Diagnosis Date Noted   Psychosis (HCC)    Polysubstance use disorder 11/04/2021   Substance-induced disorder (HCC) 11/04/2021   Patient desires pregnancy 09/20/2021   Elevated blood pressure reading 09/19/2020   Cervical cancer screening 08/01/2020   Trichomoniasis 08/01/2020   Abscess of skin of neck 02/28/2020   Epidermoid cyst of neck 07/14/2019   Acanthosis nigricans 07/14/2019   Asthma 01/03/2016   Vaginal discharge 03/02/2014   Preventative health care 06/02/2013   Screening examination for STD (sexually transmitted disease) 06/02/2013   Encounter for long-term (current) use of medications 05/26/2012   History of chlamydia 05/26/2012   TOBACCO USE 06/09/2008   OBESITY, NOS 07/10/2006   HYPERTENSION, BENIGN SYSTEMIC 07/10/2006   Allergic rhinitis 07/10/2006    History reviewed. No pertinent surgical history.  OB History   No obstetric history on file.      Home Medications    Prior to Admission medications   Medication Sig Start Date End Date Taking? Authorizing  Provider  lisinopril (ZESTRIL) 5 MG tablet Take 5 mg by mouth daily. 09/17/22  Yes [provider]  albuterol (VENTOLIN HFA) 108 (90 Base) MCG/ACT inhaler Inhale 1-2 puffs into the lungs every 6 (six) hours as needed for wheezing or shortness of breath.    [provider]  haloperidol (HALDOL) 5 MG tablet Take 1 tablet (5 mg total) by mouth 2 (two) times daily. 09/03/22   Ardis Hughs, NP  FLUoxetine (PROZAC) 20 MG capsule Take 20 mg by mouth daily.  02/25/20  [provider]    Family History Family History  Problem Relation Age of Onset   Hypertension Mother    Diabetes Mother    Hypothyroidism Mother    Hypertension Maternal Grandmother    Hypertension Maternal Grandfather    Hypertension Paternal Grandmother    Diabetes Paternal Grandmother     Social History Social History   Tobacco Use   Smoking status: Former    Current packs/day: 0.00    Average packs/day: 0.2 packs/day for 0.1 years    Types: Cigarettes    Start date: 08/08/2015    Quit date: 09/2015    Years since quitting: 7.6   Smokeless tobacco: Former    Quit date: 08/08/2015  Vaping Use   Vaping status: Never Used  Substance Use Topics   Alcohol use: Yes    Alcohol/week: 0.0 standard drinks of alcohol  Comment: occ   Drug use: No     Allergies   Doxycycline and Other   Review of Systems Review of Systems As noted in HPI  Physical Exam Triage Vital Signs ED Triage Vitals  Encounter Vitals Group     BP 05/12/23 1002 (!) 148/96     Systolic BP Percentile --      Diastolic BP Percentile --      Pulse Rate 05/12/23 1002 77     Resp 05/12/23 1002 16     Temp 05/12/23 1002 98.3 F (36.8 C)     Temp Source 05/12/23 1002 Oral     SpO2 05/12/23 1002 96 %     Weight 05/12/23 1000 265 lb (120.2 kg)     Height 05/12/23 1000 5\' 5"  (1.651 m)     Head Circumference --      Peak Flow --      Pain Score 05/12/23 0959 7     Pain Loc --      Pain Education --      Exclude  from Growth Chart --    No data found.  Updated Vital Signs BP (!) 148/96 (BP Location: Left Arm)   Pulse 77   Temp 98.3 F (36.8 C) (Oral)   Resp 16   Ht 5\' 5"  (1.651 m)   Wt 265 lb (120.2 kg)   LMP 04/13/2023 (Approximate)   SpO2 96%   BMI 44.10 kg/m   Visual Acuity Right Eye Distance:   Left Eye Distance:   Bilateral Distance:    Right Eye Near:   Left Eye Near:    Bilateral Near:     Physical Exam Vitals and nursing note reviewed.  Constitutional:      General: She is not in acute distress.    Appearance: She is obese. She is not toxic-appearing.  HENT:     Right Ear: External ear normal.     Left Ear: External ear normal.  Eyes:     General: No scleral icterus.    Conjunctiva/sclera: Conjunctivae normal.  Cardiovascular:     Rate and Rhythm: Normal rate and regular rhythm.     Heart sounds: No murmur heard. Pulmonary:     Effort: Pulmonary effort is normal. No respiratory distress.     Breath sounds: Normal breath sounds. No wheezing or rhonchi.  Genitourinary:    Comments: She declined exam Musculoskeletal:     Cervical back: Neck supple.  Skin:    General: Skin is warm and dry.  Neurological:     Mental Status: She is alert and oriented to person, place, and time.     Gait: Gait normal.  Psychiatric:        Mood and Affect: Mood normal.        Behavior: Behavior normal.        Thought Content: Thought content normal.        Judgment: Judgment normal.      UC Treatments / Results  Labs (all labs ordered are listed, but only abnormal results are displayed) Labs Reviewed  HIV ANTIBODY (ROUTINE TESTING W REFLEX)  CERVICOVAGINAL ANCILLARY ONLY    EKG   Radiology No results found.  Procedures Procedures (including critical care time)  Medications Ordered in UC Medications  albuterol (VENTOLIN HFA) 108 (90 Base) MCG/ACT inhaler 2 puff (has no administration in time range)    Initial Impression / Assessment and Plan / UC Course  I  have reviewed the triage vital signs and the  nursing notes. She was given Albuterol inhaler 2 puffs while here and will take the canister with her and use this until she picks up her inhaler from out of town. We will call her when the Vaginal swab and HIV test come back if positive.   Final Clinical Impressions(s) / UC Diagnoses   Final diagnoses:  Vaginal itching  Screen for STD (sexually transmitted disease)  Mild intermittent asthma without complication     Discharge Instructions      We will call you if your STD test are positive     ED Prescriptions   None    PDMP not reviewed this encounter.   Garey Ham, PA-C 05/12/23 1035

## 2023-05-12 NOTE — Discharge Instructions (Signed)
We will call you if your STD test are positive  

## 2023-05-13 ENCOUNTER — Telehealth: Payer: Self-pay

## 2023-05-13 MED ORDER — FLUCONAZOLE 150 MG PO TABS: 150.0000 mg | ORAL_TABLET | Freq: Once | ORAL | 0 refills | Status: AC

## 2023-05-13 MED ORDER — METRONIDAZOLE 500 MG PO TABS: 500.0000 mg | ORAL_TABLET | Freq: Two times a day (BID) | ORAL | 0 refills | Status: AC

## 2023-05-13 NOTE — Telephone Encounter (Signed)
Per protocol, pt requires tx with metronidazole and diflucan.  Rx sent to pharmacy on file.

## 2023-11-28 ENCOUNTER — Encounter: Payer: Self-pay | Admitting: Advanced Practice Midwife

## 2024-01-12 ENCOUNTER — Ambulatory Visit
Admission: EM | Admit: 2024-01-12 | Discharge: 2024-01-12 | Disposition: A | Discharge status: Home / Self Care | Payer: MEDICAID | Attending: Family Medicine | Admitting: Family Medicine

## 2024-01-12 DIAGNOSIS — M7661 Achilles tendinitis, right leg: Secondary | ICD-10-CM

## 2024-01-12 MED ORDER — TIZANIDINE HCL 4 MG PO CAPS: 4.0000 mg | ORAL_CAPSULE | Freq: Three times a day (TID) | ORAL | 0 refills | Status: AC | PRN

## 2024-01-12 MED ORDER — DEXAMETHASONE SODIUM PHOSPHATE 10 MG/ML IJ SOLN
10.0000 mg | Freq: Once | INTRAMUSCULAR | Status: AC
  Administered 2024-01-12: 10 mg via INTRAMUSCULAR

## 2024-01-12 NOTE — ED Triage Notes (Addendum)
 Pt reports pain in the back of the right  foot started last week, has hx of injury to the achillis tendon. Pt has been using tylenol  has found no relief.

## 2024-01-12 NOTE — ED Provider Notes (Signed)
 [facility]    CSN: [id number] Arrival date & time: 01/12/24  1601      History   Chief Complaint Chief Complaint  Patient presents with   Foot Pain    HPI Sara Taylor is a 33 y.o. female.   Patient presenting today with 1 week history of pain, swelling to the right Achilles.  States she had an injury to the Achilles back in 2017 and was told to wear braces, supportive shoes and do other supportive measures to keep the area from reinjuring.  Has noted the pain returning over the past week despite following recommendations.  Trying Tylenol  with no relief.  Denies numbness, tingling, complete loss of range of motion, bruising or redness.    Past Medical History:  Diagnosis Date   Anxiety    Asthma    Depression    Hypertension    Obesity    Ovarian cyst     Patient Active Problem List   Diagnosis Date Noted   Psychosis (HCC)    Polysubstance use disorder 11/04/2021   Substance-induced disorder (HCC) 11/04/2021   Patient desires pregnancy 09/20/2021   Elevated blood pressure reading 09/19/2020   Cervical cancer screening 08/01/2020   Trichomoniasis 08/01/2020   Abscess of skin of neck 02/28/2020   Epidermoid cyst of neck 07/14/2019   Acanthosis nigricans 07/14/2019   Asthma 01/03/2016   Vaginal discharge 03/02/2014   Preventative health care 06/02/2013   Screening examination for STD (sexually transmitted disease) 06/02/2013   Encounter for long-term (current) use of medications 05/26/2012   History of chlamydia 05/26/2012   TOBACCO USE 06/09/2008   OBESITY, NOS 07/10/2006   HYPERTENSION, BENIGN SYSTEMIC 07/10/2006   Allergic rhinitis 07/10/2006    History reviewed. No pertinent surgical history.  OB History   No obstetric history on file.      Home Medications    Prior to Admission medications   Medication Sig Start Date End Date Taking? Authorizing Provider  tiZANidine  (ZANAFLEX ) 4 MG capsule Take 1 capsule (4 mg total) by mouth 3  (three) times daily as needed for muscle spasms. Do not drink alcohol or drive while taking this medication.  May cause drowsiness. 01/12/24  Yes Stuart Vernell Norris, PA-C  albuterol  (VENTOLIN  HFA) 108 [phone] Base) MCG/ACT inhaler Inhale 1-2 puffs into the lungs every 6 (six) hours as needed for wheezing or shortness of breath.    [provider]  haloperidol  (HALDOL ) 5 MG tablet Take 1 tablet (5 mg total) by mouth 2 (two) times daily. 09/03/22   Mardy Elveria DEL, NP  lisinopril (ZESTRIL) 5 MG tablet Take 5 mg by mouth daily. 09/17/22   [provider]  FLUoxetine (PROZAC) 20 MG capsule Take 20 mg by mouth daily.  02/25/20  [provider]    Family History Family History  Problem Relation Age of Onset   Hypertension Mother    Diabetes Mother    Hypothyroidism Mother    Hypertension Maternal Grandmother    Hypertension Maternal Grandfather    Hypertension Paternal Grandmother    Diabetes Paternal Grandmother     Social History Social History   Tobacco Use   Smoking status: Former    Current packs/day: 0.00    Average packs/day: 0.2 packs/day for 0.1 years    Types: Cigarettes    Start date: 08/08/2015    Quit date: 09/2015    Years since quitting: 8.3   Smokeless tobacco: Former    Quit date: 08/08/2015  Vaping Use  Vaping status: Never Used  Substance Use Topics   Alcohol use: Yes    Alcohol/week: 0.0 standard drinks of alcohol    Comment: occ   Drug use: No     Allergies   Doxycycline  and Other   Review of Systems Review of Systems Per HPI  Physical Exam Triage Vital Signs ED Triage Vitals [01/12/24 1809]  Encounter Vitals Group     BP (!) 165/124     Girls Systolic BP Percentile      Girls Diastolic BP Percentile      Boys Systolic BP Percentile      Boys Diastolic BP Percentile      Pulse Rate 88     Resp 20     Temp 98.5 F (36.9 C)     Temp Source Oral     SpO2 97 %     Weight      Height      Head Circumference       Peak Flow      Pain Score 8     Pain Loc      Pain Education      Exclude from Growth Chart    No data found.  Updated Vital Signs BP (!) 165/124 (BP Location: Right Arm)   Pulse 88   Temp 98.5 F (36.9 C) (Oral)   Resp 20   LMP 12/24/2023 (Exact Date)   SpO2 97%   Visual Acuity Right Eye Distance:   Left Eye Distance:   Bilateral Distance:    Right Eye Near:   Left Eye Near:    Bilateral Near:     Physical Exam Vitals and nursing note reviewed.  Constitutional:      Appearance: Normal appearance. She is not ill-appearing.  HENT:     Head: Atraumatic.  Eyes:     Extraocular Movements: Extraocular movements intact.     Conjunctiva/sclera: Conjunctivae normal.  Cardiovascular:     Rate and Rhythm: Normal rate.  Pulmonary:     Effort: Pulmonary effort is normal.  Musculoskeletal:        General: Swelling and tenderness present. Normal range of motion.     Cervical back: Normal range of motion and neck supple.     Comments: Right Achilles tender to palpation with slight localized area of edema.  Range of motion intact but painful  Skin:    General: Skin is warm and dry.     Findings: No bruising or erythema.  Neurological:     Mental Status: She is alert and oriented to person, place, and time.     Comments: Right lower extremity neurovascularly intact  Psychiatric:        Mood and Affect: Mood normal.        Thought Content: Thought content normal.        Judgment: Judgment normal.      UC Treatments / Results  Labs (all labs ordered are listed, but only abnormal results are displayed) Labs Reviewed - No data to display  EKG   Radiology No results found.  Procedures Procedures (including critical care time)  Medications Ordered in UC Medications  dexamethasone  (DECADRON ) injection 10 mg (10 mg Intramuscular Given 01/12/24 1846)    Initial Impression / Assessment and Plan / UC Course  I have reviewed the triage vital signs and the nursing  notes.  Pertinent labs & imaging results that were available during my care of the patient were reviewed by me and considered in my medical decision  making (see chart for details).     Suspect Achilles tendinitis.  Treat with IM Decadron , Zanaflex , Ace wrap, massage, stretches, rest.  Return for worsening symptoms.  Final Clinical Impressions(s) / UC Diagnoses   Final diagnoses:  Right Achilles tendinitis   Discharge Instructions   None    ED Prescriptions     Medication Sig Dispense Auth. Provider   tiZANidine  (ZANAFLEX ) 4 MG capsule Take 1 capsule (4 mg total) by mouth 3 (three) times daily as needed for muscle spasms. Do not drink alcohol or drive while taking this medication.  May cause drowsiness. 15 capsule Stuart Vernell Norris, [place]      PDMP not reviewed this encounter.   Stuart Vernell Norris, [place] 01/12/24 1850

## 2024-01-22 ENCOUNTER — Ambulatory Visit: Payer: Self-pay

## 2024-04-02 ENCOUNTER — Ambulatory Visit
Admission: RE | Admit: 2024-04-02 | Discharge: 2024-04-02 | Disposition: A | Discharge status: Home / Self Care | Payer: MEDICAID | Source: Ambulatory Visit | Attending: Nurse Practitioner | Admitting: Nurse Practitioner

## 2024-04-02 VITALS — BP 172/105 | HR 98 | Temp 98.0°F | Resp 18

## 2024-04-02 DIAGNOSIS — N898 Other specified noninflammatory disorders of vagina: Secondary | ICD-10-CM | POA: Diagnosis present

## 2024-04-02 DIAGNOSIS — Z3202 Encounter for pregnancy test, result negative: Secondary | ICD-10-CM | POA: Diagnosis present

## 2024-04-02 LAB — POCT URINE DIPSTICK
Bilirubin, UA: NEGATIVE
Blood, UA: NEGATIVE
Glucose, UA: NEGATIVE mg/dL
Ketones, POC UA: NEGATIVE mg/dL
Leukocytes, UA: NEGATIVE
Nitrite, UA: NEGATIVE
POC PROTEIN,UA: NEGATIVE
Spec Grav, UA: 1.02 (ref 1.010–1.025)
Urobilinogen, UA: 0.2 U/dL
pH, UA: 6.5 (ref 5.0–8.0)

## 2024-04-02 LAB — POCT URINE PREGNANCY: Preg Test, Ur: NEGATIVE

## 2024-04-02 NOTE — Discharge Instructions (Addendum)
 We will contact you if the swab comes back positive for any STD today.  Recommend condom use with every sexual encounter to prevent STI.

## 2024-04-02 NOTE — ED Triage Notes (Signed)
 Pt reports she has lower abdominal pain, low back pain and vaginal irritation x 2 days  Requesting STD testing (swab only)   Denies unprotected intercourse.

## 2024-04-02 NOTE — ED Provider Notes (Signed)
 [facility]    CSN: [id number] Arrival date & time: 04/02/24  1239      History   Chief Complaint Chief Complaint  Patient presents with   SEXUALLY TRANSMITTED DISEASE    Entered by patient    HPI Sara Taylor is a 33 y.o. female.   Patient presents today with 2-day history of vaginal irritation, low back pain, and lower abdominal pain.  She denies vaginal itching or odor, fever, nausea/vomiting, vaginal discharge, rashes, sores, or lesions on her genitalia.  No groin swelling or known exposures to STI.  Reports most recent sexual activity was more than 3 months ago.  She declines HIV and syphilis testing today.  Patient acknowledges elevated blood pressure today.  Reports she has not taken her lisinopril 20 mg yet but she plans to right when she gets home.  No chest pain, shortness of breath, vision changes, headache, lightheadedness or dizziness.    Past Medical History:  Diagnosis Date   Anxiety    Asthma    Depression    Hypertension    Obesity    Ovarian cyst     Patient Active Problem List   Diagnosis Date Noted   Psychosis (HCC)    Polysubstance use disorder 11/04/2021   Substance-induced disorder (HCC) 11/04/2021   Patient desires pregnancy 09/20/2021   Elevated blood pressure reading 09/19/2020   Cervical cancer screening 08/01/2020   Trichomoniasis 08/01/2020   Abscess of skin of neck 02/28/2020   Epidermoid cyst of neck 07/14/2019   Acanthosis nigricans 07/14/2019   Asthma 01/03/2016   Vaginal discharge 03/02/2014   Preventative health care 06/02/2013   Screening examination for STD (sexually transmitted disease) 06/02/2013   Encounter for long-term (current) use of medications 05/26/2012   History of chlamydia 05/26/2012   TOBACCO USE 06/09/2008   OBESITY, NOS 07/10/2006   HYPERTENSION, BENIGN SYSTEMIC 07/10/2006   Allergic rhinitis 07/10/2006    History reviewed. No pertinent surgical history.  OB History   No obstetric  history on file.      Home Medications    Prior to Admission medications   Medication Sig Start Date End Date Taking? Authorizing Provider  albuterol  (VENTOLIN  HFA) 108 (90 Base) MCG/ACT inhaler Inhale 1-2 puffs into the lungs every 6 (six) hours as needed for wheezing or shortness of breath.    [provider]  haloperidol  (HALDOL ) 5 MG tablet Take 1 tablet (5 mg total) by mouth 2 (two) times daily. 09/03/22   Mardy Elveria DEL, NP  lisinopril (ZESTRIL) 5 MG tablet Take 5 mg by mouth daily. 09/17/22   [provider]  tiZANidine  (ZANAFLEX ) 4 MG capsule Take 1 capsule (4 mg total) by mouth 3 (three) times daily as needed for muscle spasms. Do not drink alcohol or drive while taking this medication.  May cause drowsiness. 01/12/24   Stuart Vernell Norris, PA-C  FLUoxetine (PROZAC) 20 MG capsule Take 20 mg by mouth daily.  02/25/20  [provider]    Family History Family History  Problem Relation Age of Onset   Hypertension Mother    Diabetes Mother    Hypothyroidism Mother    Hypertension Maternal Grandmother    Hypertension Maternal Grandfather    Hypertension Paternal Grandmother    Diabetes Paternal Grandmother     Social History Social History   Tobacco Use   Smoking status: Former    Current packs/day: 0.00    Average packs/day: 0.2 packs/day for 0.1 years    Types: Cigarettes  Start date: 08/08/2015    Quit date: 09/2015    Years since quitting: 8.5   Smokeless tobacco: Former    Quit date: 08/08/2015  Vaping Use   Vaping status: Never Used  Substance Use Topics   Alcohol use: Yes    Alcohol/week: 0.0 standard drinks of alcohol    Comment: occ   Drug use: No     Allergies   Doxycycline  and Other   Review of Systems Review of Systems Per HPI  Physical Exam Triage Vital Signs ED Triage Vitals  Encounter Vitals Group     BP 04/02/24 1305 (!) 172/105     Girls Systolic BP Percentile --      Girls Diastolic BP Percentile --       Boys Systolic BP Percentile --      Boys Diastolic BP Percentile --      Pulse Rate 04/02/24 1305 98     Resp 04/02/24 1305 18     Temp 04/02/24 1305 98 F (36.7 C)     Temp Source 04/02/24 1305 Oral     SpO2 04/02/24 1305 95 %     Weight --      Height --      Head Circumference --      Peak Flow --      Pain Score 04/02/24 1307 7     Pain Loc --      Pain Education --      Exclude from Growth Chart --    No data found.  Updated Vital Signs BP (!) 172/105 (BP Location: Right Wrist)   Pulse 98   Temp 98 F (36.7 C) (Oral)   Resp 18   LMP 02/23/2024 (Approximate)   SpO2 95%   Visual Acuity Right Eye Distance:   Left Eye Distance:   Bilateral Distance:    Right Eye Near:   Left Eye Near:    Bilateral Near:     Physical Exam Vitals and nursing note reviewed.  Constitutional:      General: She is not in acute distress.    Appearance: Normal appearance. She is not toxic-appearing.  Pulmonary:     Effort: Pulmonary effort is normal. No respiratory distress.  Genitourinary:    Comments: Deferred-self swab performed patient Skin:    General: Skin is warm and dry.     Coloration: Skin is not jaundiced or pale.     Findings: No erythema.  Neurological:     Mental Status: She is alert and oriented to person, place, and time.     Motor: No weakness.     Gait: Gait normal.  Psychiatric:        Mood and Affect: Mood normal.        Behavior: Behavior is cooperative.      UC Treatments / Results  Labs (all labs ordered are listed, but only abnormal results are displayed) Labs Reviewed  POCT URINE DIPSTICK - Abnormal; Notable for the following components:      Result Value   Color, UA light yellow (*)    All other components within normal limits  POCT URINE PREGNANCY  CERVICOVAGINAL ANCILLARY ONLY    EKG   Radiology No results found.  Procedures Procedures (including critical care time)  Medications Ordered in UC Medications - No data to  display  Initial Impression / Assessment and Plan / UC Course  I have reviewed the triage vital signs and the nursing notes.  Pertinent labs & imaging results that were  available during my care of the patient were reviewed by me and considered in my medical decision making (see chart for details).   Patient's blood pressure is elevated, otherwise vital signs are stable.  Patient is well-appearing.  A self swab cytology was performed which will test for gonorrhea, chlamydia, trichomonas as patient is concerned she may have one of these.  No itching or odor, therefore bacterial vaginosis and candidiasis testing deferred.  Urinalysis today is without signs of infection and urine pregnancy test is negative today.  Safe sex practices discussed with the patient.  Regarding elevated blood pressure, I recommended she take her blood pressure medication and promptly when she returns home.  She is to reevaluate her blood pressure at home and seek care if higher than 140/90.  The patient was given the opportunity to ask questions.  All questions answered to their satisfaction.  The patient is in agreement to this plan.   Final Clinical Impressions(s) / UC Diagnoses   Final diagnoses:  Vaginal irritation  Negative pregnancy test     Discharge Instructions      We will contact you if the swab comes back positive for any STD today.  Recommend condom use with every sexual encounter to prevent STI.     ED Prescriptions   None    PDMP not reviewed this encounter.   Chandra Harlene LABOR, NP 04/02/24 1358

## 2024-04-05 ENCOUNTER — Ambulatory Visit (HOSPITAL_COMMUNITY): Payer: Self-pay

## 2024-04-05 LAB — CERVICOVAGINAL ANCILLARY ONLY
Bacterial Vaginitis (gardnerella): POSITIVE — AB
Chlamydia: NEGATIVE
Comment: NEGATIVE
Comment: NEGATIVE
Comment: NEGATIVE
Comment: NORMAL
Neisseria Gonorrhea: NEGATIVE
Trichomonas: NEGATIVE

## 2024-04-05 MED ORDER — METRONIDAZOLE 500 MG PO TABS: 500.0000 mg | ORAL_TABLET | Freq: Two times a day (BID) | ORAL | 0 refills | Status: AC

## 2024-05-11 ENCOUNTER — Encounter (HOSPITAL_COMMUNITY): Payer: Self-pay | Admitting: Emergency Medicine

## 2024-05-11 ENCOUNTER — Ambulatory Visit (HOSPITAL_COMMUNITY)
Admission: EM | Admit: 2024-05-11 | Discharge: 2024-05-11 | Disposition: A | Discharge status: Home / Self Care | Payer: MEDICAID | Attending: Emergency Medicine | Admitting: Emergency Medicine

## 2024-05-11 DIAGNOSIS — N760 Acute vaginitis: Secondary | ICD-10-CM | POA: Insufficient documentation

## 2024-05-11 MED ORDER — FLUCONAZOLE 150 MG PO TABS: 150.0000 mg | ORAL_TABLET | Freq: Every day | ORAL | 0 refills | Status: AC

## 2024-05-11 NOTE — Discharge Instructions (Addendum)
 Take the diflucan  for yeast vaginitis, can repeat dose in 72 hours if itching persists  Staff will contact if we need to change / update your treatment plan  Abstain from intercourse until all results have been received  Return to clinic for new or urgent symptoms

## 2024-05-11 NOTE — ED Provider Notes (Signed)
 " [facility]    CSN: [id number] Arrival date & time: 05/11/24  0941      History   Chief Complaint Chief Complaint  Patient presents with   Vaginal Irritation    HPI Sara Taylor is a 33 y.o. female.   Patient presents to clinic over concern of vaginal itching for the past two days.  The area is 'really itchy' After urinating wiping is painful and the area is sore  Has not noticed any sores or lesions  She is sexually active, sometimes will use a condom and sometimes will not  Last sexually active around 1 week ago   Denies vaginal odor, denies changes in vaginal discharge  Denies N/V, or fevers, has been having some lower / suprapubic discomfort    Reports recent HIV and syphilis screening that was negative  Patient is confident she is not pregnant  Hx of BV, yeast, trichomonas, and chlamydia   The history is provided by the patient and medical records.    Past Medical History:  Diagnosis Date   Anxiety    Asthma    Depression    Hypertension    Obesity    Ovarian cyst     Patient Active Problem List   Diagnosis Date Noted   Psychosis (HCC)    Polysubstance use disorder 11/04/2021   Substance-induced disorder (HCC) 11/04/2021   Patient desires pregnancy 09/20/2021   Elevated blood pressure reading 09/19/2020   Cervical cancer screening 08/01/2020   Trichomoniasis 08/01/2020   Abscess of skin of neck 02/28/2020   Epidermoid cyst of neck 07/14/2019   Acanthosis nigricans 07/14/2019   Asthma 01/03/2016   Vaginal discharge 03/02/2014   Preventative health care 06/02/2013   Screening examination for STD (sexually transmitted disease) 06/02/2013   Encounter for long-term (current) use of medications 05/26/2012   History of chlamydia 05/26/2012   TOBACCO USE 06/09/2008   OBESITY, NOS 07/10/2006   HYPERTENSION, BENIGN SYSTEMIC 07/10/2006   Allergic rhinitis 07/10/2006    History reviewed. No pertinent surgical history.  OB History    No obstetric history on file.      Home Medications    Prior to Admission medications  Medication Sig Start Date End Date Taking? Authorizing Provider  albuterol  (VENTOLIN  HFA) 108 (90 Base) MCG/ACT inhaler Inhale 1-2 puffs into the lungs every 6 (six) hours as needed for wheezing or shortness of breath.   Yes [provider]  fluconazole  (DIFLUCAN ) 150 MG tablet Take 1 tablet (150 mg total) by mouth daily. 05/11/24  Yes Najeh Credit  N, FNP  haloperidol  (HALDOL ) 5 MG tablet Take 1 tablet (5 mg total) by mouth 2 (two) times daily. 09/03/22  Yes Mardy Elveria DEL, NP  lisinopril (ZESTRIL) 5 MG tablet Take 5 mg by mouth daily. 09/17/22  Yes [provider]  tiZANidine  (ZANAFLEX ) 4 MG capsule Take 1 capsule (4 mg total) by mouth 3 (three) times daily as needed for muscle spasms. Do not drink alcohol or drive while taking this medication.  May cause drowsiness. 01/12/24  Yes Stuart Vernell Norris, PA-C  FLUoxetine (PROZAC) 20 MG capsule Take 20 mg by mouth daily.  02/25/20  [provider]    Family History Family History  Problem Relation Age of Onset   Hypertension Mother    Diabetes Mother    Hypothyroidism Mother    Hypertension Maternal Grandmother    Hypertension Maternal Grandfather    Hypertension Paternal Grandmother    Diabetes Paternal Grandmother  Social History Social History[1]   Allergies   Doxycycline  and Other   Review of Systems Review of Systems  Per HPI  Physical Exam Triage Vital Signs ED Triage Vitals  Encounter Vitals Group     BP 05/11/24 1040 138/89     Girls Systolic BP Percentile --      Girls Diastolic BP Percentile --      Boys Systolic BP Percentile --      Boys Diastolic BP Percentile --      Pulse Rate 05/11/24 1040 96     Resp 05/11/24 1040 18     Temp 05/11/24 1040 98.5 F (36.9 C)     Temp Source 05/11/24 1040 Oral     SpO2 05/11/24 1040 97 %     Weight 05/11/24 1038 (!) 336 lb (152.4 kg)      Height 05/11/24 1038 5' 5 (1.651 m)     Head Circumference --      Peak Flow --      Pain Score 05/11/24 1038 0     Pain Loc --      Pain Education --      Exclude from Growth Chart --    No data found.  Updated Vital Signs BP 138/89 (BP Location: Right Arm)   Pulse 96   Temp 98.5 F (36.9 C) (Oral)   Resp 18   Ht 5' 5 (1.651 m)   Wt (!) 336 lb (152.4 kg)   LMP 04/27/2024   SpO2 97%   BMI 55.91 kg/m   Visual Acuity Right Eye Distance:   Left Eye Distance:   Bilateral Distance:    Right Eye Near:   Left Eye Near:    Bilateral Near:     Physical Exam Vitals and nursing note reviewed.  Constitutional:      Appearance: Normal appearance.  HENT:     Head: Normocephalic and atraumatic.     Right Ear: External ear normal.     Left Ear: External ear normal.     Nose: Nose normal.     Mouth/Throat:     Mouth: Mucous membranes are moist.  Eyes:     Conjunctiva/sclera: Conjunctivae normal.  Cardiovascular:     Rate and Rhythm: Normal rate.  Pulmonary:     Effort: Pulmonary effort is normal. No respiratory distress.  Neurological:     General: No focal deficit present.     Mental Status: She is alert.  Psychiatric:        Mood and Affect: Mood normal.      UC Treatments / Results  Labs (all labs ordered are listed, but only abnormal results are displayed) Labs Reviewed  CERVICOVAGINAL ANCILLARY ONLY    EKG   Radiology No results found.  Procedures Procedures (including critical care time)  Medications Ordered in UC Medications - No data to display  Initial Impression / Assessment and Plan / UC Course  I have reviewed the triage vital signs and the nursing notes.  Pertinent labs & imaging results that were available during my care of the patient were reviewed by me and considered in my medical decision making (see chart for details).  Vitals and triage reviewed, patient is hemodynamically stable.  Vaginal itching and discomfort.  Declined GU  exam. Cytology swab obtained, suspect yeast vaginitis and will treat with diflucan .   Patient confident she is not pregnant, urine pregnancy deferred.   Staff will contact if treatment modification is needed. POC, f/u care and return precautions given,  no questions at this time.     Final Clinical Impressions(s) / UC Diagnoses   Final diagnoses:  Acute vaginitis   Discharge Instructions   None    ED Prescriptions     Medication Sig Dispense Auth. Provider   fluconazole  (DIFLUCAN ) 150 MG tablet Take 1 tablet (150 mg total) by mouth daily. 2 tablet Dreama, Shizuo Biskup  N, FNP      PDMP not reviewed this encounter.     [1]  Social History Tobacco Use   Smoking status: Former    Current packs/day: 0.00    Average packs/day: 0.2 packs/day for 0.1 years    Types: Cigarettes    Start date: 08/08/2015    Quit date: 09/2015    Years since quitting: 8.6   Smokeless tobacco: Former    Quit date: 08/08/2015  Vaping Use   Vaping status: Never Used  Substance Use Topics   Alcohol use: Yes    Alcohol/week: 0.0 standard drinks of alcohol    Comment: occ   Drug use: No     Dreama Braxtyn Dorff  N, FNP 05/11/24 1120  "

## 2024-05-11 NOTE — ED Triage Notes (Signed)
 Patient c/o vaginal irritation, extremely itchy, some vaginal bleeding and hard to wipe x 2 days.  Concern for possible STI.  Denies any OTC vaginal medication.

## 2024-05-12 ENCOUNTER — Ambulatory Visit (HOSPITAL_COMMUNITY): Payer: Self-pay

## 2024-05-12 LAB — CERVICOVAGINAL ANCILLARY ONLY
Bacterial Vaginitis (gardnerella): NEGATIVE
Candida Glabrata: NEGATIVE
Candida Vaginitis: POSITIVE — AB
Chlamydia: NEGATIVE
Comment: NEGATIVE
Comment: NEGATIVE
Comment: NEGATIVE
Comment: NEGATIVE
Comment: NEGATIVE
Comment: NORMAL
Neisseria Gonorrhea: NEGATIVE
Trichomonas: NEGATIVE

## 2024-05-19 ENCOUNTER — Encounter: Payer: Self-pay | Admitting: Emergency Medicine

## 2024-05-19 ENCOUNTER — Ambulatory Visit
Admission: EM | Admit: 2024-05-19 | Discharge: 2024-05-19 | Disposition: A | Discharge status: Home / Self Care | Payer: MEDICAID

## 2024-05-19 ENCOUNTER — Other Ambulatory Visit: Payer: Self-pay

## 2024-05-19 DIAGNOSIS — N949 Unspecified condition associated with female genital organs and menstrual cycle: Secondary | ICD-10-CM | POA: Diagnosis not present

## 2024-05-19 NOTE — ED Triage Notes (Addendum)
 Pt reports has been diagnosed with UTI and yeast infection in last several weeks. Pt reports finished abx and diflucan  but states dysuria remains. Pt reports has switched laundry detergents recently and that STD screens have also been negative. Most recent episode started last week. Denies abd pain, nausea, chills, fever.  Discussed self swab and urine sample. Pt declining samples at this time.

## 2024-05-19 NOTE — ED Notes (Signed)
 Pt left exam room during provider assessment.

## 2024-05-19 NOTE — Discharge Instructions (Signed)
 Patient left prior to completing assessment and treatment.

## 2024-05-19 NOTE — ED Provider Notes (Signed)
 " [facility]    CSN: [id number] Arrival date & time: 05/19/24  0857      History   Chief Complaint Chief Complaint  Patient presents with   Dysuria    HPI Sara Taylor is a 34 y.o. female.   The history is provided by the patient.   Patient presents for complaints of ongoing dysuria and vaginal irritation.  Patient states that she has been treated with an antibiotic for urinary tract infection and Diflucan  for a yeast infection.  Patient states that she was seen by her assisted location at the end of December.  Per review of the chart, the patient did test positive for yeast at that time.  She was treated with fluconazole  150 mg.  States she was given 2 tablets and that she completed the medications.  She states that after completing the fluconazole , she no longer has vaginal itching, but continues to have pain with urination.  Patient states I will not pee because it is too painful.  Patient states that she has been tested each time and has tested negative for sexually transmitted diseases.  Per review of the triage note, patient's most recent symptoms started last week. Past Medical History:  Diagnosis Date   Anxiety    Asthma    Depression    Hypertension    Obesity    Ovarian cyst     Patient Active Problem List   Diagnosis Date Noted   Psychosis (HCC)    Polysubstance use disorder 11/04/2021   Substance-induced disorder (HCC) 11/04/2021   Patient desires pregnancy 09/20/2021   Elevated blood pressure reading 09/19/2020   Cervical cancer screening 08/01/2020   Trichomoniasis 08/01/2020   Abscess of skin of neck 02/28/2020   Epidermoid cyst of neck 07/14/2019   Acanthosis nigricans 07/14/2019   Asthma 01/03/2016   Vaginal discharge 03/02/2014   Preventative health care 06/02/2013   Screening examination for STD (sexually transmitted disease) 06/02/2013   Encounter for long-term (current) use of medications 05/26/2012   History of chlamydia  05/26/2012   TOBACCO USE 06/09/2008   OBESITY, NOS 07/10/2006   HYPERTENSION, BENIGN SYSTEMIC 07/10/2006   Allergic rhinitis 07/10/2006    History reviewed. No pertinent surgical history.  OB History   No obstetric history on file.      Home Medications    Prior to Admission medications  Medication Sig Start Date End Date Taking? Authorizing Provider  albuterol  (VENTOLIN  HFA) 108 (90 Base) MCG/ACT inhaler Inhale 1-2 puffs into the lungs every 6 (six) hours as needed for wheezing or shortness of breath.    [provider]  fluconazole  (DIFLUCAN ) 150 MG tablet Take 1 tablet (150 mg total) by mouth daily. 05/11/24   Dreama, [place]  N, FNP  haloperidol  (HALDOL ) 5 MG tablet Take 1 tablet (5 mg total) by mouth 2 (two) times daily. 09/03/22   Mardy Elveria DEL, NP  lisinopril (ZESTRIL) 5 MG tablet Take 5 mg by mouth daily. 09/17/22   [provider]  tiZANidine  (ZANAFLEX ) 4 MG capsule Take 1 capsule (4 mg total) by mouth 3 (three) times daily as needed for muscle spasms. Do not drink alcohol or drive while taking this medication.  May cause drowsiness. 01/12/24   Stuart Vernell Norris, PA-C  FLUoxetine (PROZAC) 20 MG capsule Take 20 mg by mouth daily.  02/25/20  [provider]    Family History Family History  Problem Relation Age of Onset   Hypertension Mother    Diabetes Mother  Hypothyroidism Mother    Hypertension Maternal Grandmother    Hypertension Maternal Grandfather    Hypertension Paternal Grandmother    Diabetes Paternal Grandmother     Social History Social History[1]   Allergies   Doxycycline  and Other   Review of Systems Review of Systems Per HPI  Physical Exam Triage Vital Signs ED Triage Vitals  Encounter Vitals Group     BP 05/19/24 0933 (!) 166/92     Girls Systolic BP Percentile --      Girls Diastolic BP Percentile --      Boys Systolic BP Percentile --      Boys Diastolic BP Percentile --      Pulse Rate 05/19/24  0933 90     Resp 05/19/24 0933 20     Temp 05/19/24 0933 98.3 F (36.8 C)     Temp Source 05/19/24 0933 Oral     SpO2 05/19/24 0933 96 %     Weight --      Height --      Head Circumference --      Peak Flow --      Pain Score 05/19/24 0941 10     Pain Loc --      Pain Education --      Exclude from Growth Chart --    No data found.  Updated Vital Signs BP (!) 166/92 (BP Location: Right Arm)   Pulse 90   Temp 98.3 F (36.8 C) (Oral)   Resp 20   LMP 04/27/2024   SpO2 96%   Visual Acuity Right Eye Distance:   Left Eye Distance:   Bilateral Distance:    Right Eye Near:   Left Eye Near:    Bilateral Near:     Physical Exam   UC Treatments / Results  Labs (all labs ordered are listed, but only abnormal results are displayed) Labs Reviewed - No data to display  EKG   Radiology No results found.  Procedures Procedures (including critical care time)  Medications Ordered in UC Medications - No data to display  Initial Impression / Assessment and Plan / UC Course  I have reviewed the triage vital signs and the nursing notes.  Pertinent labs & imaging results that were available during my care of the patient were reviewed by me and considered in my medical decision making (see chart for details).  Lengthy discussion with the patient regarding her current symptoms.  During the initial assessment with the patient, patient was advised that in order to ensure that she no longer has yeast infection or a urinary tract infection, she will need to repeat a urinalysis and a vaginal swab.  Patient was advised that if symptoms are ongoing, at this point, it is recommended that she follow-up with a gynecologist for further evaluation.  Patient was advised that repeat testing will provide the most accurate treatment to ensure she is being treated appropriately for her current symptoms.  Patient states that is all right.  Patient got up and left the clinic.  Patient declined self  swab and urine sample during triage.   Final Clinical Impressions(s) / UC Diagnoses   Final diagnoses:  None   Discharge Instructions   None    ED Prescriptions   None    PDMP not reviewed this encounter.    [1]  Social History Tobacco Use   Smoking status: Former    Current packs/day: 0.00    Average packs/day: 0.2 packs/day for 0.1 years  Types: Cigarettes    Start date: 08/08/2015    Quit date: 09/2015    Years since quitting: 8.6   Smokeless tobacco: Former    Quit date: 08/08/2015  Vaping Use   Vaping status: Never Used  Substance Use Topics   Alcohol use: Yes    Alcohol/week: 0.0 standard drinks of alcohol    Comment: occ   Drug use: No     Gilmer Etta PARAS, NP 05/19/24 0957  "
